# Patient Record
Sex: Female | Born: 1945
Health system: Southern US, Community
[De-identification: ages and names within clinical notes are randomized; demographics above are authoritative.]

## PROBLEM LIST (undated history)

## (undated) DIAGNOSIS — Z5189 Encounter for other specified aftercare: Secondary | ICD-10-CM

## (undated) DIAGNOSIS — I729 Aneurysm of unspecified site: Secondary | ICD-10-CM

## (undated) DIAGNOSIS — F32A Depression, unspecified: Secondary | ICD-10-CM

## (undated) DIAGNOSIS — C801 Malignant (primary) neoplasm, unspecified: Secondary | ICD-10-CM

## (undated) DIAGNOSIS — G589 Mononeuropathy, unspecified: Secondary | ICD-10-CM

## (undated) DIAGNOSIS — I639 Cerebral infarction, unspecified: Secondary | ICD-10-CM

## (undated) DIAGNOSIS — F329 Major depressive disorder, single episode, unspecified: Secondary | ICD-10-CM

## (undated) DIAGNOSIS — E785 Hyperlipidemia, unspecified: Secondary | ICD-10-CM

## (undated) DIAGNOSIS — I2699 Other pulmonary embolism without acute cor pulmonale: Secondary | ICD-10-CM

## (undated) DIAGNOSIS — Z8489 Family history of other specified conditions: Secondary | ICD-10-CM

## (undated) DIAGNOSIS — H409 Unspecified glaucoma: Secondary | ICD-10-CM

## (undated) DIAGNOSIS — D649 Anemia, unspecified: Secondary | ICD-10-CM

## (undated) DIAGNOSIS — K469 Unspecified abdominal hernia without obstruction or gangrene: Secondary | ICD-10-CM

## (undated) HISTORY — DX: Unspecified abdominal hernia without obstruction or gangrene: K46.9

## (undated) HISTORY — DX: Other pulmonary embolism without acute cor pulmonale: I26.99

## (undated) HISTORY — DX: Cerebral infarction, unspecified: I63.9

## (undated) HISTORY — DX: Encounter for other specified aftercare: Z51.89

## (undated) HISTORY — PX: CHOLECYSTECTOMY: SHX55

## (undated) HISTORY — PX: ABDOMINAL HYSTERECTOMY: SHX81

## (undated) HISTORY — PX: UPPER GI ENDOSCOPY: SHX6162

## (undated) HISTORY — DX: Major depressive disorder, single episode, unspecified: F32.9

## (undated) HISTORY — DX: Mononeuropathy, unspecified: G58.9

## (undated) HISTORY — DX: Depression, unspecified: F32.A

## (undated) HISTORY — PX: BREAST EXCISIONAL BIOPSY: SUR124

## (undated) HISTORY — DX: Unspecified glaucoma: H40.9

## (undated) HISTORY — DX: Malignant (primary) neoplasm, unspecified: C80.1

## (undated) HISTORY — DX: Aneurysm of unspecified site: I72.9

## (undated) HISTORY — DX: Anemia, unspecified: D64.9

## (undated) HISTORY — DX: Hyperlipidemia, unspecified: E78.5

---

## 1997-11-06 ENCOUNTER — Inpatient Hospital Stay (HOSPITAL_COMMUNITY): Admission: EM | Admit: 1997-11-06 | Discharge: 1997-11-08 | Payer: Self-pay | Admitting: Emergency Medicine

## 2001-06-01 ENCOUNTER — Emergency Department (HOSPITAL_COMMUNITY): Admission: EM | Admit: 2001-06-01 | Discharge: 2001-06-01 | Payer: Self-pay | Admitting: Emergency Medicine

## 2001-06-01 ENCOUNTER — Encounter: Payer: Self-pay | Admitting: Emergency Medicine

## 2001-06-21 ENCOUNTER — Emergency Department (HOSPITAL_COMMUNITY): Admission: EM | Admit: 2001-06-21 | Discharge: 2001-06-21 | Payer: Self-pay | Admitting: *Deleted

## 2002-04-14 ENCOUNTER — Encounter: Payer: Self-pay | Admitting: Gastroenterology

## 2002-04-14 ENCOUNTER — Ambulatory Visit (HOSPITAL_COMMUNITY): Admission: RE | Admit: 2002-04-14 | Discharge: 2002-04-14 | Payer: Self-pay | Admitting: Gastroenterology

## 2002-05-14 ENCOUNTER — Encounter: Payer: Self-pay | Admitting: Surgery

## 2002-05-20 ENCOUNTER — Observation Stay (HOSPITAL_COMMUNITY): Admission: RE | Admit: 2002-05-20 | Discharge: 2002-05-21 | Payer: Self-pay | Admitting: Surgery

## 2002-05-20 ENCOUNTER — Encounter: Payer: Self-pay | Admitting: Surgery

## 2004-01-18 ENCOUNTER — Emergency Department: Payer: Self-pay | Admitting: Emergency Medicine

## 2004-08-20 ENCOUNTER — Emergency Department: Payer: Self-pay | Admitting: Unknown Physician Specialty

## 2004-08-20 ENCOUNTER — Other Ambulatory Visit: Payer: Self-pay

## 2005-06-12 ENCOUNTER — Encounter: Payer: Self-pay | Admitting: Emergency Medicine

## 2008-02-12 DIAGNOSIS — I639 Cerebral infarction, unspecified: Secondary | ICD-10-CM

## 2008-02-12 HISTORY — DX: Cerebral infarction, unspecified: I63.9

## 2008-03-28 ENCOUNTER — Ambulatory Visit: Payer: Self-pay | Admitting: Gastroenterology

## 2008-06-02 ENCOUNTER — Ambulatory Visit: Payer: Self-pay | Admitting: Family Medicine

## 2008-09-14 ENCOUNTER — Ambulatory Visit: Payer: Self-pay | Admitting: Family Medicine

## 2009-02-06 ENCOUNTER — Ambulatory Visit: Payer: Self-pay | Admitting: Family Medicine

## 2009-02-11 DIAGNOSIS — I729 Aneurysm of unspecified site: Secondary | ICD-10-CM

## 2009-02-11 DIAGNOSIS — I2699 Other pulmonary embolism without acute cor pulmonale: Secondary | ICD-10-CM

## 2009-02-11 HISTORY — PX: CEREBRAL ANEURYSM REPAIR: SHX164

## 2009-02-11 HISTORY — DX: Aneurysm of unspecified site: I72.9

## 2009-02-11 HISTORY — DX: Other pulmonary embolism without acute cor pulmonale: I26.99

## 2009-02-27 ENCOUNTER — Ambulatory Visit: Payer: Self-pay | Admitting: Gastroenterology

## 2009-03-16 ENCOUNTER — Observation Stay: Payer: Self-pay | Admitting: Internal Medicine

## 2009-04-09 ENCOUNTER — Ambulatory Visit: Payer: Self-pay | Admitting: Family Medicine

## 2009-08-10 ENCOUNTER — Ambulatory Visit: Payer: Self-pay | Admitting: Family Medicine

## 2009-08-10 ENCOUNTER — Inpatient Hospital Stay: Payer: Self-pay | Admitting: Internal Medicine

## 2009-08-29 ENCOUNTER — Emergency Department: Payer: Self-pay | Admitting: Emergency Medicine

## 2009-11-01 ENCOUNTER — Inpatient Hospital Stay: Payer: Self-pay | Admitting: Psychiatry

## 2010-02-02 ENCOUNTER — Emergency Department: Payer: Self-pay | Admitting: Emergency Medicine

## 2010-07-30 ENCOUNTER — Ambulatory Visit: Payer: Self-pay | Admitting: Family Medicine

## 2010-08-03 ENCOUNTER — Emergency Department: Payer: Self-pay | Admitting: Emergency Medicine

## 2010-09-17 ENCOUNTER — Ambulatory Visit: Payer: Self-pay | Admitting: Family Medicine

## 2011-01-29 ENCOUNTER — Inpatient Hospital Stay: Payer: Self-pay | Admitting: Student

## 2011-02-12 HISTORY — PX: COLONOSCOPY: SHX174

## 2011-10-30 ENCOUNTER — Ambulatory Visit: Payer: Self-pay | Admitting: Gastroenterology

## 2011-11-21 ENCOUNTER — Ambulatory Visit: Payer: Self-pay | Admitting: Gastroenterology

## 2011-11-27 ENCOUNTER — Ambulatory Visit: Payer: Self-pay | Admitting: Hematology and Oncology

## 2011-11-27 ENCOUNTER — Observation Stay: Payer: Self-pay | Admitting: Hematology and Oncology

## 2011-11-27 LAB — CBC CANCER CENTER
Eosinophil #: 0.1 x10 3/mm (ref 0.0–0.7)
HCT: 20.7 % — ABNORMAL LOW (ref 35.0–47.0)
HGB: 6.2 g/dL — ABNORMAL LOW (ref 12.0–16.0)
Lymphocyte %: 26.7 %
MCH: 20.5 pg — ABNORMAL LOW (ref 26.0–34.0)
MCHC: 29.8 g/dL — ABNORMAL LOW (ref 32.0–36.0)
Monocyte #: 0.5 x10 3/mm (ref 0.2–0.9)
Neutrophil %: 62.2 %
Platelet: 304 x10 3/mm (ref 150–440)
WBC: 5.9 x10 3/mm (ref 3.6–11.0)

## 2011-11-27 LAB — FERRITIN: Ferritin (ARMC): 4 ng/mL — ABNORMAL LOW (ref 8–388)

## 2011-11-27 LAB — IRON AND TIBC
Iron Bind.Cap.(Total): 528 ug/dL — ABNORMAL HIGH (ref 250–450)
Iron Saturation: 3 %
Iron: 16 ug/dL — ABNORMAL LOW (ref 50–170)
Unbound Iron-Bind.Cap.: 512 ug/dL

## 2011-11-28 LAB — FOLATE: Folic Acid: 20.4 ng/mL (ref 3.1–100.0)

## 2011-11-28 LAB — HEMATOCRIT: HCT: 25.6 % — ABNORMAL LOW (ref 35.0–47.0)

## 2011-11-28 LAB — HEMOGLOBIN: HGB: 8.3 g/dL — ABNORMAL LOW (ref 12.0–16.0)

## 2011-12-13 ENCOUNTER — Ambulatory Visit: Payer: Self-pay | Admitting: Hematology and Oncology

## 2011-12-13 LAB — CBC CANCER CENTER
Basophil #: 0.1 x10 3/mm (ref 0.0–0.1)
Basophil %: 0.8 %
Eosinophil %: 1.2 %
HGB: 11.6 g/dL — ABNORMAL LOW (ref 12.0–16.0)
Lymphocyte #: 2.2 x10 3/mm (ref 1.0–3.6)
Lymphocyte %: 22 %
MCH: 25.5 pg — ABNORMAL LOW (ref 26.0–34.0)
MCV: 82 fL (ref 80–100)
Monocyte %: 8.4 %
Neutrophil #: 6.7 x10 3/mm — ABNORMAL HIGH (ref 1.4–6.5)
RBC: 4.56 10*6/uL (ref 3.80–5.20)

## 2012-01-12 ENCOUNTER — Ambulatory Visit: Payer: Self-pay | Admitting: Hematology and Oncology

## 2012-01-29 LAB — CBC CANCER CENTER
Basophil %: 1 %
Eosinophil #: 0.1 x10 3/mm (ref 0.0–0.7)
HCT: 37.6 % (ref 35.0–47.0)
HGB: 12.4 g/dL (ref 12.0–16.0)
Lymphocyte #: 2 x10 3/mm (ref 1.0–3.6)
Lymphocyte %: 37.4 %
MCHC: 33 g/dL (ref 32.0–36.0)
MCV: 80 fL (ref 80–100)
Monocyte #: 0.4 x10 3/mm (ref 0.2–0.9)
Monocyte %: 8.2 %
Neutrophil #: 2.8 x10 3/mm (ref 1.4–6.5)
WBC: 5.5 x10 3/mm (ref 3.6–11.0)

## 2012-01-29 LAB — BASIC METABOLIC PANEL
Calcium, Total: 8.7 mg/dL (ref 8.5–10.1)
Chloride: 106 mmol/L (ref 98–107)
Co2: 26 mmol/L (ref 21–32)
Creatinine: 0.87 mg/dL (ref 0.60–1.30)
EGFR (African American): >60
Sodium: 143 mmol/L (ref 136–145)

## 2012-02-12 ENCOUNTER — Ambulatory Visit: Payer: Self-pay | Admitting: Hematology and Oncology

## 2012-02-12 HISTORY — PX: HERNIA REPAIR: SHX51

## 2012-04-11 ENCOUNTER — Ambulatory Visit: Payer: Self-pay | Admitting: Hematology and Oncology

## 2012-06-02 ENCOUNTER — Inpatient Hospital Stay: Payer: Self-pay | Admitting: Internal Medicine

## 2012-06-02 ENCOUNTER — Ambulatory Visit: Payer: Self-pay | Admitting: Hematology and Oncology

## 2012-06-02 LAB — CBC CANCER CENTER
Basophil #: 0.1 x10 3/mm (ref 0.0–0.1)
Basophil %: 1.2 %
Eosinophil %: 0.6 %
HGB: 6.9 g/dL — ABNORMAL LOW (ref 12.0–16.0)
Lymphocyte %: 20.6 %
MCH: 18.5 pg — ABNORMAL LOW (ref 26.0–34.0)
MCHC: 30.2 g/dL — ABNORMAL LOW (ref 32.0–36.0)
Monocyte #: 0.7 x10 3/mm (ref 0.2–0.9)
Neutrophil #: 5.3 x10 3/mm (ref 1.4–6.5)
Neutrophil %: 68.7 %
Platelet: 363 x10 3/mm (ref 150–440)
RBC: 3.72 10*6/uL — ABNORMAL LOW (ref 3.80–5.20)
RDW: 18.2 % — ABNORMAL HIGH (ref 11.5–14.5)

## 2012-06-02 LAB — COMPREHENSIVE METABOLIC PANEL
Albumin: 3.7 g/dL (ref 3.4–5.0)
Anion Gap: 15 (ref 7–16)
Bilirubin,Total: 0.2 mg/dL (ref 0.2–1.0)
Chloride: 105 mmol/L (ref 98–107)
Co2: 22 mmol/L (ref 21–32)
EGFR (African American): >60
EGFR (Non-African Amer.): 59 — ABNORMAL LOW
Osmolality: 285 (ref 275–301)
Potassium: 4 mmol/L (ref 3.5–5.1)
SGOT(AST): 15 U/L (ref 15–37)
SGPT (ALT): 22 U/L (ref 12–78)
Sodium: 142 mmol/L (ref 136–145)

## 2012-06-02 LAB — FOLATE: Folic Acid: 19.6 ng/mL (ref 3.1–100.0)

## 2012-06-03 LAB — CBC WITH DIFFERENTIAL/PLATELET
Basophil %: 0.5 %
Eosinophil #: 0.2 10*3/uL (ref 0.0–0.7)
Eosinophil #: 0.1 10*3/uL (ref 0.0–0.7)
Eosinophil %: 1.9 %
HCT: 25.8 % — ABNORMAL LOW (ref 35.0–47.0)
HCT: 26.5 % — ABNORMAL LOW (ref 35.0–47.0)
HGB: 8.3 g/dL — ABNORMAL LOW (ref 12.0–16.0)
Lymphocyte #: 2.1 10*3/uL (ref 1.0–3.6)
Lymphocyte %: 40.8 %
Lymphocyte %: 33.7 %
MCH: 21.8 pg — ABNORMAL LOW (ref 26.0–34.0)
MCH: 21.4 pg — ABNORMAL LOW (ref 26.0–34.0)
MCHC: 31.8 g/dL — ABNORMAL LOW (ref 32.0–36.0)
MCV: 68 fL — ABNORMAL LOW (ref 80–100)
MCV: 67 fL — ABNORMAL LOW (ref 80–100)
Monocyte #: 0.9 x10 3/mm (ref 0.2–0.9)
Monocyte #: 0.7 x10 3/mm (ref 0.2–0.9)
Neutrophil #: 3.3 10*3/uL (ref 1.4–6.5)
RDW: 24.2 % — ABNORMAL HIGH (ref 11.5–14.5)
RDW: 24 % — ABNORMAL HIGH (ref 11.5–14.5)
WBC: 6.9 10*3/uL (ref 3.6–11.0)
WBC: 6.2 10*3/uL (ref 3.6–11.0)

## 2012-06-03 LAB — BASIC METABOLIC PANEL
Calcium, Total: 8.1 mg/dL — ABNORMAL LOW (ref 8.5–10.1)
Co2: 27 mmol/L (ref 21–32)
Creatinine: 0.64 mg/dL (ref 0.60–1.30)
EGFR (African American): >60
Glucose: 96 mg/dL (ref 65–99)
Osmolality: 282 (ref 275–301)
Potassium: 3.8 mmol/L (ref 3.5–5.1)

## 2012-06-04 DIAGNOSIS — D649 Anemia, unspecified: Secondary | ICD-10-CM

## 2012-06-04 LAB — CBC WITH DIFFERENTIAL/PLATELET
Basophil #: 0.1 10*3/uL (ref 0.0–0.1)
Basophil %: 0.8 %
Eosinophil #: 0.3 10*3/uL (ref 0.0–0.7)
Eosinophil %: 4.2 %
HCT: 25.3 % — ABNORMAL LOW (ref 35.0–47.0)
HGB: 8 g/dL — ABNORMAL LOW (ref 12.0–16.0)
Lymphocyte #: 2.6 10*3/uL (ref 1.0–3.6)
Lymphocyte %: 41 %
MCH: 21.4 pg — ABNORMAL LOW (ref 26.0–34.0)
MCHC: 31.7 g/dL — ABNORMAL LOW (ref 32.0–36.0)
MCV: 68 fL — ABNORMAL LOW (ref 80–100)
Monocyte #: 0.8 x10 3/mm (ref 0.2–0.9)
Monocyte %: 11.9 %
Neutrophil #: 2.7 10*3/uL (ref 1.4–6.5)
Neutrophil %: 42.1 %
Platelet: 279 10*3/uL (ref 150–440)
WBC: 6.4 10*3/uL (ref 3.6–11.0)

## 2012-06-04 LAB — PATHOLOGY REPORT

## 2012-06-10 ENCOUNTER — Encounter: Payer: Self-pay | Admitting: General Surgery

## 2012-06-11 ENCOUNTER — Ambulatory Visit: Payer: Self-pay | Admitting: Hematology and Oncology

## 2012-07-07 ENCOUNTER — Ambulatory Visit: Payer: Self-pay | Admitting: General Surgery

## 2012-08-04 ENCOUNTER — Encounter: Payer: Self-pay | Admitting: General Surgery

## 2012-08-04 ENCOUNTER — Ambulatory Visit (INDEPENDENT_AMBULATORY_CARE_PROVIDER_SITE_OTHER): Payer: Medicare Other | Admitting: General Surgery

## 2012-08-04 VITALS — BP 118/68 | HR 90 | Resp 16 | Ht 61.0 in | Wt 196.0 lb

## 2012-08-04 DIAGNOSIS — R1310 Dysphagia, unspecified: Secondary | ICD-10-CM

## 2012-08-04 DIAGNOSIS — K449 Diaphragmatic hernia without obstruction or gangrene: Secondary | ICD-10-CM | POA: Insufficient documentation

## 2012-08-04 NOTE — Progress Notes (Addendum)
Patient ID: Gail Hardy, female   DOB: 11/02/1945, 67 y.o.   MRN: [id number]  Chief Complaint  Patient presents with  . Other    evaluation of hernia    HPI Gail Hardy is a 67 y.o. female who presents for a hernia evaluation. Was in [facility] May 2014 and needed a blood transfusion, at that time Dr Mechele Collin did a upper endoscopy.  She does admit to having trouble swallowing lettuce, bread, chicken and beef for about 4-5 months and seem to be worse here lately.  Angel hair pasta is "ok" if cut up really good.  She had a  barium swallow done as well at [facility]  She does have depression and takes Abilify which has a side effect of tremors. Weight loss has been about 10 pounds. The patient was evaluated during her April 2014 hospitalization for anemia. Upper endoscopy at that time showed significant crater, linear and superficial gastric ulcers within the gastric body. No active bleeding. Pulse also deformity of the duodenal bulb. 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 HPI  Past Medical History  Diagnosis Date  . Stroke   . Aneurysm 2011  . Blood transfusion without reported diagnosis 2013,2014    x 2  . Glaucoma   . Hyperlipidemia   . Anemia   . Hernia   . Depression   . Pulmonary emboli 2011    Past Surgical History  Procedure Laterality Date  . Abdominal hysterectomy    . Cholecystectomy    . Upper gi endoscopy  2013, 2014    [facility]  . Colonoscopy  2013    [facility]    Family History  Problem Relation Age of Onset  . Emphysema Father     Social History History  Substance Use Topics  . Smoking status: Former Games developer  . Smokeless tobacco: Never Used  . Alcohol Use: No    No Known Allergies  Current Outpatient Prescriptions  Medication Sig Dispense Refill  . ABILIFY 30 MG tablet Take 30 mg by mouth daily.       . citalopram (CELEXA) 20 MG tablet Take 20 mg by mouth daily.       . ferrous sulfate 325 (65 FE) MG tablet Take 325 mg by  mouth 2 (two) times daily.      Marland Kitchen latanoprost (XALATAN) 0.005 % ophthalmic solution Place 1 drop into both eyes at bedtime.       Marland Kitchen rOPINIRole (REQUIP) 2 MG tablet Take 4 mg by mouth at bedtime.       . temazepam (RESTORIL) 30 MG capsule Take 30 mg by mouth at bedtime.        No current facility-administered medications for this visit.    Review of Systems Review of Systems  Constitutional: Negative.   Respiratory: Negative.   Cardiovascular: Negative.     Blood pressure 118/68, pulse 90, resp. rate 16, height 5\' 1"  (1.549 m), weight 196 lb (88.905 kg).  Physical Exam Physical Exam  Constitutional: She is oriented to person, place, and time. She appears well-developed.  Cardiovascular: Normal rate and regular rhythm.   Pulmonary/Chest: Effort normal and breath sounds normal.  Abdominal: Soft. Bowel sounds are normal.  Neurological: She is alert and oriented to person, place, and time.  Skin: Skin is warm and dry.    Data Reviewed The patient underwent upper lower endoscopy as well as capsule endoscopy in fall 2013th to assess for source of GI bleeding.  Assessment    Paraesophageal  hernia with dysphagia, likely gastric source of recent GI blood loss.    Plan    The indications for surgical repair were discussed. Consultation will be obtained with Effie Shy, M.D. for his expertise in regards to repair of sizable paraesophageal hernias. The patient will be contacted when this appointment is scheduled. Depending upon Dr. Allayne Butcher recommendation, surgery may be completed here at [facility], or in [place] as he determines is in her best interest.       [name] 10/15/2012, 7:08 AM

## 2012-08-04 NOTE — Patient Instructions (Addendum)
The patient is aware to call back for any questions or concerns.  We will be in touch to schedule Repair of Paraesophageal Hernia.  Hernia repair discussed as well as risk and benefits of surgery.  May need a few days in the hospital to recovery.

## 2012-08-05 ENCOUNTER — Telehealth: Payer: Self-pay | Admitting: *Deleted

## 2012-08-05 ENCOUNTER — Encounter: Payer: Self-pay | Admitting: General Surgery

## 2012-08-05 DIAGNOSIS — R1310 Dysphagia, unspecified: Secondary | ICD-10-CM | POA: Insufficient documentation

## 2012-08-05 NOTE — Telephone Encounter (Signed)
Message copied by Nicholes Mango on Wed Aug 05, 2012  8:53 AM ------      Message from: [place], [place] W      Created: Wed Aug 05, 2012  7:00 AM       Please arrange an appointment with Effie Shy, M.D. regarding paraesophageal hernia. Thank you ------

## 2012-08-05 NOTE — Telephone Encounter (Signed)
Patient was contacted today to inform her that records have been forwarded to Dr. Effie Shy and Lupita Leash at their office will be calling patient to arrange appointment date. This patient and her husband verbalize understanding.   Records received per Lupita Leash.

## 2012-09-10 ENCOUNTER — Ambulatory Visit: Payer: Self-pay | Admitting: Hematology and Oncology

## 2012-09-10 LAB — CBC CANCER CENTER
Basophil %: 0.8 %
Eosinophil #: 0.1 x10 3/mm (ref 0.0–0.7)
Eosinophil %: 1.7 %
Lymphocyte #: 2.6 x10 3/mm (ref 1.0–3.6)
Lymphocyte %: 36.9 %
MCV: 81 fL (ref 80–100)
Monocyte #: 0.6 x10 3/mm (ref 0.2–0.9)
Neutrophil #: 3.7 x10 3/mm (ref 1.4–6.5)
Neutrophil %: 51.9 %
RBC: 4.91 10*6/uL (ref 3.80–5.20)
WBC: 7.1 x10 3/mm (ref 3.6–11.0)

## 2012-09-11 ENCOUNTER — Ambulatory Visit: Payer: Self-pay | Admitting: Hematology and Oncology

## 2012-10-01 ENCOUNTER — Telehealth: Payer: Self-pay | Admitting: *Deleted

## 2012-10-01 NOTE — Telephone Encounter (Signed)
Per Sheria Lang at Dr. Allayne Butcher office, this patient has been scheduled for a lap paraesophageal hernia repair on 10-20-12 at 9:30 am (3 hour case) and Dr. Alva Garnet would like you to assit. I have blocked you out on your schedule to help. I also asked that their office fax records for your review.

## 2012-10-13 ENCOUNTER — Ambulatory Visit: Payer: Self-pay | Admitting: General Surgery

## 2012-10-13 ENCOUNTER — Other Ambulatory Visit: Payer: Self-pay | Admitting: General Surgery

## 2012-10-13 DIAGNOSIS — K449 Diaphragmatic hernia without obstruction or gangrene: Secondary | ICD-10-CM

## 2012-10-13 LAB — BASIC METABOLIC PANEL
Anion Gap: 5 — ABNORMAL LOW (ref 7–16)
BUN: 18 mg/dL (ref 7–18)
Chloride: 107 mmol/L (ref 98–107)
Co2: 27 mmol/L (ref 21–32)
Creatinine: 0.73 mg/dL (ref 0.60–1.30)
EGFR (African American): >60
Potassium: 3.6 mmol/L (ref 3.5–5.1)
Sodium: 139 mmol/L (ref 136–145)

## 2012-10-13 LAB — CBC WITH DIFFERENTIAL/PLATELET
Basophil %: 0.9 %
Eosinophil #: 0.1 10*3/uL (ref 0.0–0.7)
Eosinophil %: 1.4 %
HCT: 36.5 % (ref 35.0–47.0)
HGB: 12.4 g/dL (ref 12.0–16.0)
Lymphocyte %: 30.4 %
MCH: 26.8 pg (ref 26.0–34.0)
MCV: 79 fL — ABNORMAL LOW (ref 80–100)
Monocyte #: 0.7 x10 3/mm (ref 0.2–0.9)
Platelet: 283 10*3/uL (ref 150–440)

## 2012-10-14 ENCOUNTER — Encounter: Payer: Self-pay | Admitting: General Surgery

## 2012-10-14 ENCOUNTER — Ambulatory Visit (INDEPENDENT_AMBULATORY_CARE_PROVIDER_SITE_OTHER): Payer: Medicare Other | Admitting: General Surgery

## 2012-10-14 VITALS — BP 130/64 | HR 102 | Resp 20 | Ht 61.0 in | Wt 199.0 lb

## 2012-10-14 DIAGNOSIS — K449 Diaphragmatic hernia without obstruction or gangrene: Secondary | ICD-10-CM

## 2012-10-14 NOTE — Progress Notes (Signed)
Patient ID: Gail Hardy, female   DOB: [birth year], 67 y.o.   MRN: [id number]  Chief Complaint  Patient presents with  . Pre-op Exam    paraesophageal hernia    HPI Gail Hardy is a 67 y.o. female here today for her pre op paraesophageal hernia. The patient reports persistent postprandial discomfort and occasional vomiting. In spite of this her weight is up 4 pounds from urgent 2014 exam. She's been evaluated by Effie Shy, M.D. and plans are for repair of her paraesophageal hernia next week. She is accompanied by her husband. HPI  Past Medical History  Diagnosis Date  . Stroke   . Aneurysm 2011  . Blood transfusion without reported diagnosis 2013,2014    x 2  . Glaucoma   . Hyperlipidemia   . Anemia   . Hernia   . Depression   . Pulmonary emboli 2011    Past Surgical History  Procedure Laterality Date  . Abdominal hysterectomy    . Cholecystectomy    . Upper gi endoscopy  2013, 2014    [facility]  . Colonoscopy  2013    [facility]    Family History  Problem Relation Age of Onset  . Emphysema Father     Social History History  Substance Use Topics  . Smoking status: Former Games developer  . Smokeless tobacco: Never Used  . Alcohol Use: No    No Known Allergies  Current Outpatient Prescriptions  Medication Sig Dispense Refill  . ABILIFY 30 MG tablet Take 30 mg by mouth daily.       . citalopram (CELEXA) 20 MG tablet Take 20 mg by mouth daily.       . ferrous sulfate 325 (65 FE) MG tablet Take 325 mg by mouth 2 (two) times daily.      Marland Kitchen latanoprost (XALATAN) 0.005 % ophthalmic solution Place 1 drop into both eyes at bedtime.       Marland Kitchen rOPINIRole (REQUIP) 2 MG tablet Take 4 mg by mouth at bedtime.       . temazepam (RESTORIL) 30 MG capsule Take 30 mg by mouth at bedtime.        No current facility-administered medications for this visit.    Review of Systems Review of Systems  Constitutional: Negative.   Respiratory: Negative.   Cardiovascular: Negative.      Blood pressure 130/64, pulse 102, resp. rate 20, height 5\' 1"  (1.549 m), weight 199 lb (90.266 kg).  Physical Exam Physical Exam  Constitutional: She is oriented to person, place, and time. She appears well-developed and well-nourished.  Eyes: Conjunctivae are normal. No scleral icterus.  Cardiovascular: Normal rate, regular rhythm and normal heart sounds.   Pulmonary/Chest: Effort normal and breath sounds normal.  Abdominal: Soft. Bowel sounds are normal.  Lymphadenopathy:    She has no cervical adenopathy.  Neurological: She is alert and oriented to person, place, and time.  Skin: Skin is warm and dry.    Data Reviewed No new data.  Assessment    Paraesophageal hernia with dysphagia.    Plan     plans for repair of the paraesophageal hernia were reviewed. The risks associated with the procedure including those of bleeding, infection, cardiovascular events, pulmonary embolus, DVT and UTI were reviewed. The possibility of a gastrostomy tube for stomach fixation was discussed.        [name] 10/15/2012, 7:09 AM

## 2012-10-14 NOTE — Patient Instructions (Addendum)
The patient will be scheduled for  surgery.

## 2012-10-15 ENCOUNTER — Encounter: Payer: Self-pay | Admitting: General Surgery

## 2012-10-20 ENCOUNTER — Ambulatory Visit: Payer: Self-pay | Admitting: Bariatrics

## 2012-10-20 DIAGNOSIS — K449 Diaphragmatic hernia without obstruction or gangrene: Secondary | ICD-10-CM

## 2012-10-20 DIAGNOSIS — K254 Chronic or unspecified gastric ulcer with hemorrhage: Secondary | ICD-10-CM

## 2012-10-22 ENCOUNTER — Encounter: Payer: Self-pay | Admitting: *Deleted

## 2012-10-22 ENCOUNTER — Telehealth: Payer: Self-pay | Admitting: *Deleted

## 2012-10-22 ENCOUNTER — Encounter: Payer: Self-pay | Admitting: General Surgery

## 2012-10-22 NOTE — Telephone Encounter (Signed)
I called insurance #1 [phone] and had ondansetron odt 4 mg #30 authorized for 1 year

## 2012-11-02 ENCOUNTER — Ambulatory Visit (INDEPENDENT_AMBULATORY_CARE_PROVIDER_SITE_OTHER): Payer: Medicare Other | Admitting: General Surgery

## 2012-11-02 ENCOUNTER — Encounter: Payer: Self-pay | Admitting: General Surgery

## 2012-11-02 VITALS — BP 138/76 | HR 74 | Resp 14 | Ht 61.0 in | Wt 196.0 lb

## 2012-11-02 DIAGNOSIS — R1310 Dysphagia, unspecified: Secondary | ICD-10-CM

## 2012-11-02 DIAGNOSIS — K449 Diaphragmatic hernia without obstruction or gangrene: Secondary | ICD-10-CM

## 2012-11-02 NOTE — Patient Instructions (Addendum)
Patient advised to continue using heating pad for pain relief. She is also advised she can start increasing her diet slowly. Patient to return in 6 weeks for a follow up visit.

## 2012-11-02 NOTE — Progress Notes (Signed)
Patient ID: Gail Hardy, female   DOB: 10-16-1945, 67 y.o.   MRN: [id number]  Chief Complaint  Patient presents with  . Routine Post Op    post op lap paraesophageal hernia repair    HPI Gail Hardy is a 67 y.o. female who presents for a post op laparoscopic paraesophageal hernia repair. The procedure was done 10/20/12. The only complaint the patient states is left upper quadrant of the abdomen. Overall the patient is doing well. The patient states she had an episode of difficultly catching her breath the day after surgery while just sitting there. This has not recurred.  She reports that she did develop some pain under the left rib cage in the last couple of days, worse with motion. She is still being careful with her diet, voiding dry foods or hard foods. She reports no difficulty with bowel function.   HPI  Past Medical History  Diagnosis Date  . Stroke   . Aneurysm 2011  . Blood transfusion without reported diagnosis 2013,2014    x 2  . Glaucoma   . Hyperlipidemia   . Anemia   . Hernia   . Depression   . Pulmonary emboli 2011    Past Surgical History  Procedure Laterality Date  . Abdominal hysterectomy    . Cholecystectomy    . Upper gi endoscopy  2013, 2014    [facility]  . Colonoscopy  2013    [facility]thik[facility]  . Hernia repair  2014    lap paraesophageal repair    Family History  Problem Relation Age of Onset  . Emphysema Father     Social History History  Substance Use Topics  . Smoking status: Former Games developer  . Smokeless tobacco: Never Used  . Alcohol Use: No    Allergies  Allergen Reactions  . Alprazolam     Other reaction(s): Altered mental status (finding)  . Diazepam     Other reaction(s): Feeling agitated (finding)    Current Outpatient Prescriptions  Medication Sig Dispense Refill  . ABILIFY 30 MG tablet Take 30 mg by mouth daily.       . citalopram (CELEXA) 20 MG tablet Take 20 mg by mouth daily.       . ferrous sulfate 325 (65 FE) MG tablet  Take 325 mg by mouth 2 (two) times daily.      Marland Kitchen latanoprost (XALATAN) 0.005 % ophthalmic solution Place 1 drop into both eyes at bedtime.       . ondansetron (ZOFRAN-ODT) 4 MG disintegrating tablet Take 1 tablet by mouth as needed.      Marland Kitchen rOPINIRole (REQUIP) 2 MG tablet Take 4 mg by mouth at bedtime.       . temazepam (RESTORIL) 30 MG capsule Take 30 mg by mouth at bedtime.        No current facility-administered medications for this visit.    Review of Systems Review of Systems  Constitutional: Negative.   Respiratory: Negative.   Cardiovascular: Negative.   Gastrointestinal: Negative.     Blood pressure 138/76, pulse 74, resp. rate 14, height 5\' 1"  (1.549 m), weight 196 lb (88.905 kg).  Physical Exam Physical Exam  Constitutional: She is oriented to person, place, and time. She appears well-developed and well-nourished.  Cardiovascular: Normal rate, regular rhythm and normal heart sounds.   No murmur heard. Pulmonary/Chest: Effort normal and breath sounds normal.  Abdominal: Soft. Bowel sounds are normal. There is tenderness (left upper quadrant right under the rib near port site. ).  Neurological: She is alert and oriented to person, place, and time.  Skin: Skin is warm and dry.    Data Reviewed No data.  Assessment    Left rib pain secondary to previously placed port site. No evidence of intra-abdominal process.  Doing well status post repair of paraesophageal hernia with Nissen fundoplication and gastropexy.    Plan    The patient will gradually liberalized her diet as tolerated. She has been encouraged to avoid dry bread at this time. She may make use of meats as she is comfortable. She will follow up in one to 2 weeks with Dr. Alva Garnet, at which time the gastropexy wires will be removed.  As she is still salving some discomfort or prescription for Norco 5/325, #30 with the inscription one by mouth every 4 hours when necessary for pain with no refills was provided.        [name] 11/03/2012, 7:07 AM

## 2012-11-03 ENCOUNTER — Encounter: Payer: Self-pay | Admitting: General Surgery

## 2012-12-02 ENCOUNTER — Ambulatory Visit (INDEPENDENT_AMBULATORY_CARE_PROVIDER_SITE_OTHER): Payer: Medicare Other | Admitting: General Surgery

## 2012-12-02 ENCOUNTER — Encounter: Payer: Self-pay | Admitting: General Surgery

## 2012-12-02 VITALS — BP 130/70 | HR 100 | Resp 14 | Ht 61.0 in | Wt 198.0 lb

## 2012-12-02 DIAGNOSIS — K449 Diaphragmatic hernia without obstruction or gangrene: Secondary | ICD-10-CM

## 2012-12-02 NOTE — Progress Notes (Signed)
Patient ID: Gail Hardy, female   DOB: 1945-09-14, 67 y.o.   MRN: [id number]  Chief Complaint  Patient presents with  . Follow-up    paraesophageal hernia    HPI Gail Hardy is a 67 y.o. female who presents for a post op laparoscopic paraesophageal hernia repair. The procedure was done 10/20/12.Patient states she is doing well. She reports no dysphasia. No dietary intolerance.  HPI  Past Medical History  Diagnosis Date  . Stroke   . Aneurysm 2011  . Blood transfusion without reported diagnosis 2013,2014    x 2  . Glaucoma   . Hyperlipidemia   . Anemia   . Hernia   . Depression   . Pulmonary emboli 2011  . Cancer     Past Surgical History  Procedure Laterality Date  . Abdominal hysterectomy    . Cholecystectomy    . Upper gi endoscopy  2013, 2014    [facility]  . Colonoscopy  2013    [facility]th[facility]  . Hernia repair  2014    lap paraesophageal repair    Family History  Problem Relation Age of Onset  . Emphysema Father     Social History History  Substance Use Topics  . Smoking status: Former Games developer  . Smokeless tobacco: Never Used  . Alcohol Use: No    Allergies  Allergen Reactions  . Alprazolam     Other reaction(s): Altered mental status (finding)  . Diazepam     Other reaction(s): Feeling agitated (finding)    Current Outpatient Prescriptions  Medication Sig Dispense Refill  . ABILIFY 30 MG tablet Take 30 mg by mouth daily.       . citalopram (CELEXA) 20 MG tablet Take 20 mg by mouth daily.       . ferrous sulfate 325 (65 FE) MG tablet Take 325 mg by mouth 2 (two) times daily.      Marland Kitchen latanoprost (XALATAN) 0.005 % ophthalmic solution Place 1 drop into both eyes at bedtime.       . ondansetron (ZOFRAN-ODT) 4 MG disintegrating tablet Take 1 tablet by mouth as needed.      Marland Kitchen rOPINIRole (REQUIP) 2 MG tablet Take 4 mg by mouth at bedtime.       . temazepam (RESTORIL) 30 MG capsule Take 30 mg by mouth at bedtime.        No current facility-administered  medications for this visit.    Review of Systems Review of Systems  Constitutional: Negative.   Respiratory: Negative.   Cardiovascular: Negative.     Blood pressure 130/70, pulse 100, resp. rate 14, height 5\' 1"  (1.549 m), weight 198 lb (89.812 kg).  Physical Exam Physical Exam  Constitutional: She is oriented to person, place, and time. She appears well-developed and well-nourished.  Eyes: No scleral icterus.  Cardiovascular: Normal rate, regular rhythm and normal heart sounds.   Pulmonary/Chest: Breath sounds normal.  Abdominal: Soft. Normal appearance and bowel sounds are normal.  Lymphadenopathy:    She has no cervical adenopathy.  Neurological: She is alert and oriented to person, place, and time.  Skin: Skin is dry.    Data Reviewed None.  Assessment    Doing well status post paraesophageal hernia repair.     Plan    Follow up here will be on an as needed basis.        [name] 12/02/2012, 8:21 PM

## 2012-12-02 NOTE — Patient Instructions (Signed)
Patient to return as needed. 

## 2012-12-16 ENCOUNTER — Ambulatory Visit: Payer: Self-pay | Admitting: Hematology and Oncology

## 2012-12-17 ENCOUNTER — Other Ambulatory Visit: Payer: Self-pay | Admitting: Bariatrics

## 2012-12-17 DIAGNOSIS — K254 Chronic or unspecified gastric ulcer with hemorrhage: Secondary | ICD-10-CM

## 2012-12-21 ENCOUNTER — Ambulatory Visit
Admission: RE | Admit: 2012-12-21 | Discharge: 2012-12-21 | Disposition: A | Discharge status: Home / Self Care | Payer: Medicare Other | Source: Ambulatory Visit | Attending: Bariatrics | Admitting: Bariatrics

## 2012-12-21 DIAGNOSIS — K254 Chronic or unspecified gastric ulcer with hemorrhage: Secondary | ICD-10-CM

## 2012-12-31 LAB — CBC CANCER CENTER
Basophil #: 0.1 x10 3/mm (ref 0.0–0.1)
Eosinophil #: 0.1 x10 3/mm (ref 0.0–0.7)
Eosinophil %: 0.8 %
HCT: 36.8 % (ref 35.0–47.0)
Lymphocyte %: 27 %
MCH: 23.6 pg — ABNORMAL LOW (ref 26.0–34.0)
MCHC: 31.6 g/dL — ABNORMAL LOW (ref 32.0–36.0)
MCV: 75 fL — ABNORMAL LOW (ref 80–100)
Neutrophil #: 5.8 x10 3/mm (ref 1.4–6.5)
RBC: 4.94 10*6/uL (ref 3.80–5.20)
RDW: 15.2 % — ABNORMAL HIGH (ref 11.5–14.5)
WBC: 9.1 x10 3/mm (ref 3.6–11.0)

## 2013-01-11 ENCOUNTER — Ambulatory Visit: Payer: Self-pay | Admitting: Hematology and Oncology

## 2013-06-17 ENCOUNTER — Ambulatory Visit: Payer: Self-pay | Admitting: Hematology and Oncology

## 2013-06-17 LAB — CBC CANCER CENTER
BASOS ABS: 0 x10 3/mm (ref 0.0–0.1)
Basophil %: 0.7 %
EOS ABS: 0.1 x10 3/mm (ref 0.0–0.7)
Eosinophil %: 1.4 %
HCT: 41.6 % (ref 35.0–47.0)
HGB: 13.8 g/dL (ref 12.0–16.0)
LYMPHS PCT: 32 %
Lymphocyte #: 2.4 x10 3/mm (ref 1.0–3.6)
MCH: 27 pg (ref 26.0–34.0)
MCHC: 33.1 g/dL (ref 32.0–36.0)
MCV: 82 fL (ref 80–100)
MONOS PCT: 7.6 %
Monocyte #: 0.6 x10 3/mm (ref 0.2–0.9)
NEUTROS PCT: 58.3 %
Neutrophil #: 4.3 x10 3/mm (ref 1.4–6.5)
PLATELETS: 210 x10 3/mm (ref 150–440)
RBC: 5.11 10*6/uL (ref 3.80–5.20)
RDW: 17.6 % — ABNORMAL HIGH (ref 11.5–14.5)
WBC: 7.4 x10 3/mm (ref 3.6–11.0)

## 2013-07-12 ENCOUNTER — Ambulatory Visit: Payer: Self-pay | Admitting: Hematology and Oncology

## 2013-08-04 ENCOUNTER — Ambulatory Visit: Payer: Self-pay | Admitting: Family Medicine

## 2013-08-10 DIAGNOSIS — G43119 Migraine with aura, intractable, without status migrainosus: Secondary | ICD-10-CM | POA: Insufficient documentation

## 2013-12-13 ENCOUNTER — Encounter: Payer: Self-pay | Admitting: General Surgery

## 2014-06-03 NOTE — Op Note (Signed)
PATIENT NAME:  Gail Hardy, Gail Hardy MR#:  [id number] DATE OF BIRTH:  [birth year]  DATE OF PROCEDURE:  10/20/2012  PROCEDURE PERFORMED:  Laparoscopic repair of paraesophageal hiatal hernia with relaxing incision of the right hemidiaphragm and xenograft coverage of iatrogenic defect followed by 034 degree fundoplication and anterior gastropexy.   PHYSICIANS IN ATTENDANCE:  Arletta Bale, M.D.  ASSISTANT:  Hervey Ard, M.D.   PREOPERATIVE DIAGNOSIS:  Paraesophageal hiatal hernia with progressive symptoms of chest pressure, nausea, vomiting and dysphagia.   POSTOPERATIVE DIAGNOSIS:  Paraesophageal hiatal hernia with progressive symptoms of chest pressure, nausea, vomiting and dysphagia with prominent hiatus associated with increased tension as part of the muscular repair prompting relaxing incision and coverage of iatrogenic defect.   PROCEDURE:  The patient was brought to the operating room, placed in the supine position.  General anesthesia obtained with orotracheal intubation.  The patient had a Foley catheter inserted sterilely.  TED hose and Thromboguards applied and the patient then placed in a low lithotomy position with Allen stirrups.  The patient then had prep and drape of the chest and abdomen.  A small incision made at the level of the umbilicus through which a Veress needle was introduced in the abdominal cavity which was confirmed by saline drop test.  A peri-pneumoperitoneum obtained with carbon dioxide.  A 5 mm Optiview trocar was then introduced in the left upper abdomen under direct visualization.  This was then followed by introduction of 3 additional trocars across the upper abdomen and a Nathanson liver retractor introduced through a subxiphoid defect.  Upon elevation of the left lobe of the liver the patient had a prominent hiatal hernia as previously noted by endoscopy and radiology.  The patient had traction on the gastrohepatic ligament which was then incised by use of a Harmonic  scalpel.  This was then carried across the anterior hiatus where the herniated peritoneum was incised across the anterior margin of the hiatus.  Blunt dissection was then used to create a preperitoneal plane separating the herniated peritoneum from the overlying pericardium.  Further traction was then placed on the herniated peritoneum and it was then incised just lateral to the right crural margin.  Blunt dissection was then used to initiate reduction of herniated lesser sac fatty tissue.  As this was done portions of the herniated stomach were able to be reduced into the abdominal cavity.  The patient then had division of short gastric vessels along the upper outer curvature of the stomach.  This was then followed by freeing of the upper stomach from the undersurface of the left hemidiaphragm.  Then phrenoesophageal ligaments then divided along the (Dictation Anomaly)medial aspect of the left crus, effort made to spare as much peritoneum as possible on the crural musculature.  At this point dissection of the lower esophagus performed within the mediastinum sweeping the esophagus away from the underlying aorta and pleural surfaces by a combination of blunt dissection and some use of the Harmonic scalpel.  Care was taken to sweep the anterior and posterior vagal nerves towards the esophagus as dissection was extended into the lower mediastinum.  A Penrose drain was passed around the GE junction to assist with traction.  Next, additional mediastinal dissection performed further mobilizing the esophagus from the pleural surfaces, the aorta and the pericardium.  The dissection was extended up until a point in which the pulmonary vasculature was identifiable.  On the right side there was prominence of lymph nodes noted in the region of the pulmonary vasculature, some  of which were bisected in an effort to avoid entry into the pleura and avoid a power source adjacent to the posterior vagal nerve.  Similar lymphovascular  attachments divided on the lateral aspect of the esophagus as it was swept away from the pleura on the left side.  With this circumferential dissection we were able to (Dictation Anomaly)deliver 4 cm of the esophagus lying comfortably in the abdominal cavity as confirmed by tape measuring device.  At this point, a 60 French bougie was passed transorally.  This was then used as an aid in sizing of the hiatal closure.  Three interrupted 0 Ethibond sutures were placed posteriorly approximately in the crural musculature.  As this was being done there was no evidence of increasing tension on the diaphragmatic musculature and a relaxing incision of the fascia was felt to be appropriate to reduce potential for future recurrence.  This was accomplished by division of the thickened fascia at a point midway between the vena cava and the upper medial margin of the hiatus.  The fascia incised with use of a Harmonic scalpel exposing underlying peripleural fat pad.  The release was extended inferiorly to a point parallel with the upper suture.  The release was then extended in a curvilinear fashion superiorly following the margins of the hiatus to a point in which the fascia was noted by the pericardium.  This resulted in an iatrogenic defect that measured approximately 4-1/2 cm in length and it broadened to a diameter of approximately 1.5 cm.  This resulted in significant reduction of the diaphragmatic musculature at the level of the hiatus.  No additional sutures felt to be necessary at this time.  The iatrogenic defect was then covered with a sheet of Accell xenograft patch.  This was positioned in a somewhat exaggerated C configuration with the base of the membrane directly against the abdominal contents.  This was secured to the diaphragm with the use of a 10 mm hernia stapler.  Care was taken to avoid contact between the prosthesis and the body of the esophagus.  Next, the upper stomach delivered through the retroesophageal  window and a 161 degree fundoplication was configured.  The new upper greater curvature of the stomach was brought into proximity to the retroesophageal portion of the stomach at the level of the distal esophagus.  The initial suture did include a portion of the anterior esophagus lateral to the anterior vagal nerve which was identified and preserved.  A second suture was placed above this.  Next, additional sutures were placed at the 2 and 10 o'clock position to further fixate the fundoplication to the anterior esophagus.  A 5 mm instrument was placed through the wrap adjacent to the dilated esophagus revealing what was felt to be adequate residual laxity of a fundoplication around the distal esophagus and this was performed with the bougie in place within the gastric lumen.  At this point, the dilator was withdrawn.  The posterior apex of the wrap was secured to the underlying diaphragmatic musculature with a 2-0 Monocryl suture using seromuscular purchase of the stomach.  This was done to prevent proximal migration of the apex of the wrap in the lower mediastinum.  In addition to further fixate the stomach into the abdominal cavity, traction was placed down the body of the stomach, an area was chosen for fixation of this to the anterior abdominal wall.  A 2-0 chromic suture was passed by way of a Keith needle into the abdominal cavity at the site  used previously for the [facility].  The needle was introduced into the body of the stomach just above the incisura along the lesser curvature aspect of the stomach and the needle re-delivered through the anterior abdominal wall and placed on upper traction.  Next, three T fasteners were introduced percutaneously and anchored around the initial suture.  These all were then brought up gradually by way of traction bringing the anterior stomach into opposition with the peritoneum.  The sutures and T fasteners were then fixed.  Hemostasis was confirmed and at  this point no additional intervention felt to be necessary.  The pneumoperitoneum relieved.  The trocar was removed.  The wound was injected with 0.25% Marcaine with epinephrine followed by closure of the dermis with 4-0 Monocryl followed by Dermabond.  The patient at this time allowed to recover having tolerated the procedure well.     ____________________________ Venia Carbon. Duke Salvia, MD mat:ea D: 10/21/2012 23:41:00 ET T: 10/22/2012 00:50:50 ET JOB#: [id number]  cc: Legrand Como A. Duke Salvia, MD, <Dictator> Robert Bellow, MD Ladora Daniel MD ELECTRONICALLY SIGNED 11/03/2012 15:09

## 2014-06-03 NOTE — Consult Note (Signed)
I spoke to Dr. Marlan Palau nurse and will get Dr. Fleet Contras to see this patient for evaluation for possible surgery.  Electronic Signatures: Manya Silvas (MD)  (Signed on 23-Apr-14 14:35)  Authored  Last Updated: 23-Apr-14 14:35 by Manya Silvas (MD)

## 2014-06-03 NOTE — Consult Note (Signed)
CC: anemia and melena.  Pt with several days of melena, severe iron def anemia.  Plan EGD this afternoon and colonoscopy tomorrow and capsule to follow if culprit not found.  See dictated consult.  Pt had work up 9 months ago which was neg.  These things can be difficult, esp if a small Dieulafoy ulcer is the source.   Electronic Signatures: Manya Silvas (MD)  (Signed on 23-Apr-14 07:45)  Authored  Last Updated: 23-Apr-14 07:45 by Manya Silvas (MD)

## 2014-06-03 NOTE — Consult Note (Signed)
CC: anemia, melena.  Pt with EGD showing large hiatal hernia, multiple erosions in the proximal stomach associated with the hiatal hernia.  No blood and no active bleeding.  Abnormality of duodenum with deformity, no ulceration.  Likely had bleeding from the proximal gastric erosions associated with the hiatal hernia.  Will suggest surgical consult.  I will speak to surgeon.  Avoid all NSAID meds  Electronic Signatures: Manya Silvas (MD)  (Signed on 23-Apr-14 13:47)  Authored  Last Updated: 23-Apr-14 13:47 by Manya Silvas (MD)

## 2014-06-03 NOTE — Discharge Summary (Signed)
PATIENT NAME:  Gail Hardy, Gail Hardy MR#:  [id number] DATE OF BIRTH:  1945-05-14  DATE OF ADMISSION:  06/02/2012 DATE OF DISCHARGE:  06/04/2012  ADMISSION DIAGNOSES:  1.  Melena.  2.  Acute blood loss anemia.   DISCHARGE DIAGNOSES: 1.  Melena.  2.  Acute blood loss anemia, secondary to gastric erosions.  3.  History of bipolar affective disorder.  4.  Hyperlipidemia.   CONSULTS:  1.  Dr. [name].  2.  Dr. Bary Castilla.   PROCEDURES: The patient underwent an EGD on 06/03/2012 which showed a hiatal hernia, gastric ulcers with clean base, likely cause of her anemia, normal atrium and duodenal deformities.   Upper GI study showed limited evaluation secondary to the patient's limited ingestion of oral contrast. There is a large paraesophageal hiatal hernia, ectasia at the esophagus.   Discharge white blood cells 6.4, hemoglobin 8, hematocrit 25.3, platelets are 279.   Sodium 141, potassium 3.8, chloride 110, bicarb 27, BUN 16, creatinine 0.64, glucose is 94.   Vitamin B12 was greater than 1999.   HOSPITAL COURSE: A 69 year old female who presented with symptomatic acute blood loss anemia and melena. For further details, please refer to the H and P.   1.  Melena, likely secondary to an upper GI bleed. She underwent an EGD which showed gastric erosions. It was felt that the hiatal hernia could possibly have caused the gastric erosions. Therefore Dr. Bary Castilla was consulted. Dr. Bary Castilla feels that it may not be, but did recommend Carafate, following up with him up in 1 week. She also had an upper GI, with results as above. Dr. [name] recommended PPI, Carafate, and ferrous sulfate, and the patient will follow up in  4 weeks. The patient was told that if she was to develop symptomatic anemia or acute melena should report back to the ER. The other thing is that she could have Dieulafoy lesions, which would be hard to evaluate unless the patient is actively bleeding.  2.  Blood loss anemia, status post 2  units of PRBCs: Her hemoglobin has remained stable over the last 3 days.  3.  Bipolar affective disorder: The patient will resume her Abilify and Celexa.  4.  Hyperlipidemia: The patient's statin was held during this hospitalization, however she may resume this at discharge.   DISCHARGE MEDICATIONS:  1.  Celexa 20 mg daily.  2.  Latanoprost 0.005%, 1 drop to each affected eye at bedtime.  3.  Requip 0.5 mg, 2 tablets at bedtime.  4.  Temazepam 30 mg, 1 tablet at bedtime.  5   Simvastatin, 20 mg at bedtime.  6.  Abilify 30 mg, one-half tablet daily.  7. Vitamin B12 1000 mcg, 1 tablet daily.  8.  Acetaminophen/oxycodone 325/5, 1 tablet q.4 hours p.r.n. pain.  9.  Iron polysaccharide 150 mg daily.  10.  Carafate 1 gram q.i.d.  11. Pantoprazole 40 mg daily.   DISCHARGE DIET: Low sodium.   DISCHARGE ACTIVITY: To be as tolerated.   DISCHARGE FOLLOWUP: The patient will follow up with Dr. Rosanna Randy and Dr. Bary Castilla 1 to  2 weeks, and in 4 to 6 weeks with Dr. [name].   Time spent: Approximately 35 minutes.  The patient is medically stable for discharge.     ____________________________ Kerrie Timm P. Benjie Karvonen, MD spm:dm D: 06/04/2012 13:59:14 ET T: 06/04/2012 14:27:40 ET JOB#: [id number]  cc: Gordon Carlson P. Benjie Karvonen, MD, <Dictator> Richard L. Rosanna Randy, MD Manya Silvas, MD Dawna Jakes P Mekesha Solomon MD ELECTRONICALLY SIGNED 06/04/2012 15:01

## 2014-06-03 NOTE — Consult Note (Signed)
PATIENT NAME:  Gail Hardy, Gail Hardy MR#:  [id number] DATE OF BIRTH:  08-May-1945  DATE OF CONSULTATION:  06/03/2012  REFERRING PHYSICIAN:  Gaylyn Cheers, MD.  CONSULTING PHYSICIAN:  Robert Bellow, MD.  PRIMARY PHYSICIAN:  Miguel Aschoff, MD.   INDICATION FOR CONSULTATION: Hiatal hernia, GI bleeding.     CLINICAL NOTE: This 69 year old woman has been admitted twice in the last year with profound microcytic anemia. During her first evaluation in September 2013, she underwent upper and lower endoscopy as well as capsule endoscopy without a site of bleeding identified. She reported on admission a 2-week history of black stools. When she began to feel weak, she presented for assessment. She was found to have a low hemoglobin and was admitted for transfusion. She underwent a repeat upper endoscopy on April 23 under the care of [employer]. He found multiple erosions and a hiatal hernia. The question is at present whether elective hiatal hernia surgery would be of benefit.   The patient reports that she has intermittent dysphagia, especially for chicken and bread. This may necessitate regurgitation to clear her esophagus. She otherwise has no history of significant vomiting of undigested food. The patient reports a 7-pound weight loss, intentional based on dietary modification. She denies any significant history of acid reflux.   Active medical issues include elevated cholesterol, restless leg syndrome, iron deficiency anemia, and a previous stroke after clip application for a middle cerebral artery aneurysm.   The patient was hemodynamically stable throughout her stay. Laboratory studies were notable for an admission hemoglobin of 6.9, improving to 8.3 after transfusion. Hemoglobin MCV was 61. Normal white count and differential. Normal renal function and liver function studies. At the time of her October 2013 presentation, her hemoglobin was 6.2. Highest value recorded was 12.4 in December 2013. At  that time, her MCV had risen to 80.   CT of the abdomen in 2010 showed evidence of a moderate-sized hiatal hernia. Her most current CT shows no significant difference.   Endoscopy films were reviewed.   The erosions present at this time and the hiatal hernia may be the sole source for bleeding, although the volume of bleeding would seem to suggest a more active source such as a Dieulafoy's ulcer.   Upper GI and barium swallow completed today showed ectasia and mild kinking of the distal thoracic esophagus. A large paraesophageal hiatal hernia is identified. No evidence of gastroesophageal reflux. The 12.5 mm barium tablet traversed the esophagus and hiatal hernia without incident. The stomach was unremarkable. Normal rotation. The study was felt compromised due to limited ingestion of oral contrast.   The previous CTs had not suggested a paraesophageal hernia. This may indeed require surgical repair with the erosions identified; again, it may not be the sole source for bleeding.   At this time, the patient can be discharged home with the addition of Carafate to her anti-acid regimen and arrangements made for outpatient followup in my office in 3 to 4 weeks.   Thanks for the opportunity of seeing this patient.    ____________________________ Robert Bellow, MD jwb:gb D: 06/04/2012 21:37:00 ET T: 06/04/2012 22:54:33 ET JOB#: [id number]  cc: Robert Bellow, MD, <Dictator> Manya Silvas, MD Richard L. Rosanna Randy, MD Geralda Baumgardner Amedeo Kinsman MD ELECTRONICALLY SIGNED 06/05/2012 9:25

## 2014-06-03 NOTE — Discharge Summary (Signed)
PATIENT NAME:  Gail Hardy, Gail Hardy MR#:  [id number] DATE OF BIRTH:  1945-10-20  DATE OF ADMISSION:  10/20/2012 DATE OF DISCHARGE:  10/22/2012  DISCHARGE DIAGNOSIS: Paraesophageal hernia.   CLINICAL NOTE: This 70 year old woman who was having episodic chest pain. Evaluation showed evidence of a sizable paraesophageal hernia. She was felt to be a candidate for repair. She underwent repair of her paraesophageal hernia, Nissen fundoplication, gastropexy and mesh reinforcement of the hiatus on 10/20/2012 under the care of Arletta Bale, M.D. She was begun on clear liquids that day and her diet was gradually advanced to a soft diet over the next 48 hours. Her cardiopulmonary exam remained unremarkable. At the time of discharge she was felt to be a candidate for home management. Her regular medications were reinitiated as documented on the home-going instruction sheet. She was advised to make use of a soft diet and to avoid breads and meets until further notice.   The patient tolerated her hospitalization well without any known complications.    ____________________________ Robert Bellow, MD jwb:dp D: 11/06/2012 10:07:18 ET T: 11/06/2012 11:25:56 ET JOB#: [id number]  cc: Robert Bellow, MD, <Dictator> Venia Carbon. Duke Salvia, MD Richard L. Rosanna Randy, MD Winona Sison Amedeo Kinsman MD ELECTRONICALLY SIGNED 11/09/2012 15:28

## 2014-06-03 NOTE — H&P (Signed)
PATIENT NAME:  Gail Hardy, Gail Hardy MR#:  [id number] DATE OF BIRTH:  12-05-1945  DATE OF ADMISSION:  06/02/2012   PRIMARY CARE PHYSICIAN:  Dr. Rosanna Randy.  ONCOLOGIST:  Dr. Kallie Edward.  CHIEF COMPLAINT:   The patient was brought in from [facility] because of anemia and black stools.   HISTORY OF PRESENT ILLNESS:   A 69 year old female patient with history of depression and previous history of anemia, brought from Dr. Elvera Maria office because the patient blood work showed hemoglobin of 6.9 and hematocrit of 22.7. The patient complains of some black stool for the past 2 weeks. The patient was brought in as a direct admit.  Saw the patient in the room. The patient says that for the last 2 weeks, black stools are associated with some epigastric pain. No nausea. No vomiting. No loss of appetite. No weight loss. The patient denies any other complaints. The patient's last hemoglobin in December was 12.4.  The patient also denies any use of NSAIDs on a regular basis.   PAST MEDICAL HISTORY:  Significant for a history of depression, glaucoma and hyperlipidemia. The patient had an EGD and colonoscopy recently, in September of last year by Dr. Dionne Milo, and the EGD showed some hiatal hernia and normal stomach, abnormal  duodenal fold.  Colonoscopy, at that time, showed a polyp in the descending colon, which was resected.  The patient's past medical history also includes history of cerebral aneurysm, operated in 2011. History of PE in 2011. Chronic headaches. She also has a history of GERD.   PAST SURGICAL HISTORY:  Significant for aneurysm repair in the brain, hysterectomy and cholecystectomy.   HOME MEDICATIONS:    Abilify 30 mg p.o. daily, Celexa 20 mg p.o. daily,  cyanocobalamin 1000 mcg p.o. daily, , simvastatin 20 mg daily, temazepam 30 mg at bedtime.  ALLERGIES:  BARIUM AND XANAX.   FAMILY HISTORY:  No hypertension or diabetes.   REVIEW OF SYSTEMS: CONSTITUTIONAL:  The patient feels fatigued and tired. No  fever.  HEAD:  No headache.  EARS, NOSE, THROAT:  No epistaxis. No ear pain and no throat pain.  CARDIOVASCULAR:  Denies any chest pain.  GASTROINTESTINAL:  Has black stools for 2 weeks. No nausea. No vomiting.  LUNGS:  Denies any wheezing, no trouble breathing.  EXTREMITIES:  No extremity edema. The patient denies any joint pains.   PHYSICAL EXAMINATION: VITAL SIGNS:  Blood pressure 122/89, pulse is 92, and the patient's sat is 99% on room air.  GENERAL:  She is alert, awake, oriented. Denies any complaints.  Looks pale.  HEAD AND EYES: Head atraumatic, normocephalic. Pupils equally reacting to light. Extraocular movements intact. The patient's conjunctivae is very pale.   EARS, NOSE, THROAT:  No tympanic membrane congestion. No turbinate hypertrophy. No oropharyngeal erythema.  NECK:  Normal range of motion. No JVD. No carotid bruits.  CARDIOVASCULAR:  S1 and S2 regular. No murmurs.  LUNGS:  Clear to auscultation. No wheeze. No rales.  ABDOMEN:  Soft. Mild epigastric tenderness present. No rebound tenderness. No organomegaly. No hernias.  EXTREMITIES:  No extremity edema. No cyanosis. No clubbing.  NEUROLOGIC:  Alert, awake and oriented. Cranial nerves II through XII are intact. Power 5/5 upper and lower extremities. Senses are intact. DTRs 2+ bilaterally.  PSYCHIATRIC:  Mood and affect are within normal limits. Looks slightly anxious. Has tremors, which are benign essential tremors. She said it is due to Abilify.     LABORATORY, DIAGNOSTIC AND RADIOLOGICAL DATA:  Electrolytes: Sodium 142, potassium 4,  chloride 105, bicarb 22, BUN 14, creatinine 1, glucose 150, rest of electrolytes within normal limits. WBC 7.7, hemoglobin 6.9, hematocrit 22.7 and platelets 366. The patient's hemoglobin in December was 12.4.    ASSESSMENT AND PLAN:  A 69 year old female patient with weakness, dizziness, black stools associated with significant anemia, with hb 6.9 which is acute. The patient is going to be  admitted to telemetry for acute anemia with melena. We will give her 2 units of transfusion, continue IV PPIs, GI consult with Dr. [name]. The patient will  have CBC q.8 hours. Depression, continue home medications of Abilify and Celexa. The patient follows up with Dr. Kallie Edward for severe iron deficiency anemia. She was admitted in December for symptomatic anemia, and the patient's EGD and colonoscopy were done in September. Nothing major on the EGD and colonoscopy, and she also had a capsule endoscope at that time in September, did not show any bleeding. The patient received a blood transfusion at that time in September, and also iron in November. Now comes with anemia and dizziness and black stools, so she needs GI workup. Continue PPIs, consult GI. The patient initially was tachycardic at Dr. Elvera Maria office,  but now she is not tachycardic. We will continue IV hydration.   TIME SPENT ON THE HISTORY AND PHYSICAL: About 55 minutes.     ____________________________ Epifanio Lesches, MD sk:dmm D: 06/02/2012 17:26:22 ET T: 06/02/2012 20:11:30 ET JOB#: [id number]  cc: Epifanio Lesches, MD, <Dictator> Richard L. Rosanna Randy, MD Epifanio Lesches MD ELECTRONICALLY SIGNED 06/22/2012 11:42

## 2014-06-03 NOTE — Consult Note (Signed)
PATIENT NAME:  Gail Hardy, Gail Hardy MR#:  [id number] DATE OF BIRTH:  07-Aug-1945  DATE OF CONSULTATION:  06/03/2012  CONSULTING PHYSICIAN:  Manya Silvas, MD  HISTORY OF PRESENT ILLNESS: The patient is a 68 year old white female who has severe iron deficiency anemia. She reports a history of several days of melena. She denies taking any Pepto-Bismol or iron preparations. She presented with a hemoglobin of 6.9. She has been transfused, and hemoglobin is up to 8.3 with a couple of units. I was asked to see her in consultation.   The patient had the same exact clinical scenario in September and had a colonoscopy, upper endoscopy and capsule study with negative findings.   REVIEW OF SYSTEMS: She denies any sores in the mouth. No trouble swallowing. She has a hiatal hernia, occasionally has difficulty with food passing. Mild heartburn and reflux symptoms. She has had a history of ulcers in the past. She is not constipated and does not have diarrhea. Normally moves 1 stool a day, sometimes 1 to 3 bowel movements a day. She denies abdominal pain. No hematemesis. No hematochezia. She does clearly have melena though, without iron or Pepto-Bismol. She denies any dysuria or hematuria. No vaginal bleeding. No breast lumps, no nipple discharge.   PAST MEDICAL HISTORY: She has had a partial hysterectomy, appendectomy, gallbladder surgery. Also, a history of depression, glaucoma and hyperlipidemia. History of a cerebral aneurysm operated on in 2011, history of a PE in 2011. She does have chronic headaches.   MEDICATIONS ON ADMISSION:  1. Abilify 30 mg a day.  2. Celexa 20 mg a day.  3. Vitamin B12 1000 mcg a day.  4. Simvastatin 20 mg a day.  5. Temazepam 30 mg at bedtime.   ALLERGIES: BARIUM AND XANAX. SHE HAD AN ADMISSION WHEN SHE WAS HAVING ALTERED MENTAL STATUS FROM [facility].   FAMILY HISTORY: Negative for cancer in parents or first-degree relatives.   PHYSICAL EXAMINATION:  GENERAL: A white  female in no acute distress. She has a significant tremor. She relates that to the Abilify.  HEENT: Sclerae nonicteric. Conjunctivae pale. Tongue negative.  NECK: Shows no carotid bruits. No masses.  HEART: Normal S1 and S2. No murmurs.  CHEST: Clear to auscultation.  ABDOMEN: Soft. No hepatosplenomegaly. No masses. No bruits. Slight tenderness in epigastric area.  EXTREMITIES: She has compression devices on. No palpable edema  PSYCHIATRIC: The patient is an appropriate historian, answers questions well. Awake, alert and oriented.   LABORATORY DATA: Shows a hemoglobin of 6.9, hematocrit 22.7. Hemoglobin in December was 12.4. White count 7.7. BUN 14, creatinine 1, potassium 4, bicarbonate 22. Platelet count 366.   ASSESSMENT: Melena and hypochromic microcytic indices, previous similar presentation.   PLAN: Upper endoscopy today and then colonoscopy tomorrow. If that is negative, then repeat capsule study.    ____________________________ Manya Silvas, MD rte:OSi D: 06/03/2012 07:51:21 ET T: 06/03/2012 08:11:38 ET JOB#: [id number]  cc: Manya Silvas, MD, <Dictator> Manya Silvas MD ELECTRONICALLY SIGNED 06/24/2012 14:13

## 2014-08-26 ENCOUNTER — Other Ambulatory Visit: Payer: Self-pay | Admitting: Specialist

## 2014-08-26 DIAGNOSIS — S73102A Unspecified sprain of left hip, initial encounter: Secondary | ICD-10-CM

## 2014-08-29 ENCOUNTER — Ambulatory Visit
Admission: RE | Admit: 2014-08-29 | Discharge: 2014-08-29 | Disposition: A | Discharge status: Home / Self Care | Payer: PPO | Source: Ambulatory Visit | Attending: Specialist | Admitting: Specialist

## 2014-08-29 ENCOUNTER — Other Ambulatory Visit: Payer: Self-pay | Admitting: Family Medicine

## 2014-08-29 DIAGNOSIS — M25552 Pain in left hip: Secondary | ICD-10-CM | POA: Insufficient documentation

## 2014-08-29 DIAGNOSIS — S73102A Unspecified sprain of left hip, initial encounter: Secondary | ICD-10-CM

## 2014-08-31 NOTE — Telephone Encounter (Signed)
Do you want both of these refilled [facility]

## 2014-09-19 ENCOUNTER — Other Ambulatory Visit: Payer: Self-pay | Admitting: Family Medicine

## 2014-09-20 ENCOUNTER — Ambulatory Visit (INDEPENDENT_AMBULATORY_CARE_PROVIDER_SITE_OTHER): Payer: PPO | Admitting: Family Medicine

## 2014-09-20 ENCOUNTER — Encounter: Payer: Self-pay | Admitting: Family Medicine

## 2014-09-20 VITALS — BP 120/70 | HR 90 | Temp 98.1°F | Resp 16 | Ht 61.0 in | Wt 204.0 lb

## 2014-09-20 DIAGNOSIS — F32A Depression, unspecified: Secondary | ICD-10-CM | POA: Insufficient documentation

## 2014-09-20 DIAGNOSIS — R000 Tachycardia, unspecified: Secondary | ICD-10-CM | POA: Insufficient documentation

## 2014-09-20 DIAGNOSIS — F329 Major depressive disorder, single episode, unspecified: Secondary | ICD-10-CM | POA: Insufficient documentation

## 2014-09-20 DIAGNOSIS — Z1239 Encounter for other screening for malignant neoplasm of breast: Secondary | ICD-10-CM

## 2014-09-20 DIAGNOSIS — B379 Candidiasis, unspecified: Secondary | ICD-10-CM | POA: Insufficient documentation

## 2014-09-20 DIAGNOSIS — F419 Anxiety disorder, unspecified: Secondary | ICD-10-CM | POA: Insufficient documentation

## 2014-09-20 DIAGNOSIS — E669 Obesity, unspecified: Secondary | ICD-10-CM | POA: Diagnosis not present

## 2014-09-20 DIAGNOSIS — E663 Overweight: Secondary | ICD-10-CM | POA: Insufficient documentation

## 2014-09-20 DIAGNOSIS — M5430 Sciatica, unspecified side: Secondary | ICD-10-CM | POA: Insufficient documentation

## 2014-09-20 DIAGNOSIS — R413 Other amnesia: Secondary | ICD-10-CM | POA: Insufficient documentation

## 2014-09-20 DIAGNOSIS — G4700 Insomnia, unspecified: Secondary | ICD-10-CM | POA: Insufficient documentation

## 2014-09-20 DIAGNOSIS — I639 Cerebral infarction, unspecified: Secondary | ICD-10-CM | POA: Insufficient documentation

## 2014-09-20 DIAGNOSIS — I6782 Cerebral ischemia: Secondary | ICD-10-CM | POA: Insufficient documentation

## 2014-09-20 DIAGNOSIS — R748 Abnormal levels of other serum enzymes: Secondary | ICD-10-CM | POA: Insufficient documentation

## 2014-09-20 DIAGNOSIS — IMO0002 Reserved for concepts with insufficient information to code with codable children: Secondary | ICD-10-CM | POA: Insufficient documentation

## 2014-09-20 DIAGNOSIS — K529 Noninfective gastroenteritis and colitis, unspecified: Secondary | ICD-10-CM | POA: Insufficient documentation

## 2014-09-20 DIAGNOSIS — G2581 Restless legs syndrome: Secondary | ICD-10-CM | POA: Insufficient documentation

## 2014-09-20 DIAGNOSIS — E7800 Pure hypercholesterolemia, unspecified: Secondary | ICD-10-CM | POA: Insufficient documentation

## 2014-09-20 DIAGNOSIS — G8929 Other chronic pain: Secondary | ICD-10-CM | POA: Insufficient documentation

## 2014-09-20 DIAGNOSIS — J309 Allergic rhinitis, unspecified: Secondary | ICD-10-CM | POA: Insufficient documentation

## 2014-09-20 DIAGNOSIS — F40240 Claustrophobia: Secondary | ICD-10-CM | POA: Insufficient documentation

## 2014-09-20 DIAGNOSIS — Z0000 Encounter for general adult medical examination without abnormal findings: Secondary | ICD-10-CM | POA: Diagnosis not present

## 2014-09-20 DIAGNOSIS — I671 Cerebral aneurysm, nonruptured: Secondary | ICD-10-CM | POA: Insufficient documentation

## 2014-09-20 DIAGNOSIS — J860 Pyothorax with fistula: Secondary | ICD-10-CM | POA: Insufficient documentation

## 2014-09-20 DIAGNOSIS — R519 Headache, unspecified: Secondary | ICD-10-CM | POA: Insufficient documentation

## 2014-09-20 DIAGNOSIS — R5381 Other malaise: Secondary | ICD-10-CM | POA: Insufficient documentation

## 2014-09-20 DIAGNOSIS — K259 Gastric ulcer, unspecified as acute or chronic, without hemorrhage or perforation: Secondary | ICD-10-CM | POA: Insufficient documentation

## 2014-09-20 DIAGNOSIS — I2699 Other pulmonary embolism without acute cor pulmonale: Secondary | ICD-10-CM | POA: Insufficient documentation

## 2014-09-20 DIAGNOSIS — K449 Diaphragmatic hernia without obstruction or gangrene: Secondary | ICD-10-CM | POA: Insufficient documentation

## 2014-09-20 DIAGNOSIS — R251 Tremor, unspecified: Secondary | ICD-10-CM | POA: Insufficient documentation

## 2014-09-20 DIAGNOSIS — F319 Bipolar disorder, unspecified: Secondary | ICD-10-CM | POA: Insufficient documentation

## 2014-09-20 DIAGNOSIS — F05 Delirium due to known physiological condition: Secondary | ICD-10-CM | POA: Insufficient documentation

## 2014-09-20 DIAGNOSIS — K228 Other specified diseases of esophagus: Secondary | ICD-10-CM | POA: Insufficient documentation

## 2014-09-20 DIAGNOSIS — M6281 Muscle weakness (generalized): Secondary | ICD-10-CM | POA: Insufficient documentation

## 2014-09-20 DIAGNOSIS — D649 Anemia, unspecified: Secondary | ICD-10-CM | POA: Insufficient documentation

## 2014-09-20 DIAGNOSIS — G20 Parkinson's disease: Secondary | ICD-10-CM | POA: Insufficient documentation

## 2014-09-20 DIAGNOSIS — G939 Disorder of brain, unspecified: Secondary | ICD-10-CM | POA: Insufficient documentation

## 2014-09-20 DIAGNOSIS — D500 Iron deficiency anemia secondary to blood loss (chronic): Secondary | ICD-10-CM | POA: Insufficient documentation

## 2014-09-20 DIAGNOSIS — R5383 Other fatigue: Secondary | ICD-10-CM

## 2014-09-20 DIAGNOSIS — IMO0001 Reserved for inherently not codable concepts without codable children: Secondary | ICD-10-CM | POA: Insufficient documentation

## 2014-09-20 DIAGNOSIS — R51 Headache: Secondary | ICD-10-CM

## 2014-09-20 DIAGNOSIS — R531 Weakness: Secondary | ICD-10-CM | POA: Insufficient documentation

## 2014-09-20 DIAGNOSIS — G723 Periodic paralysis: Secondary | ICD-10-CM | POA: Insufficient documentation

## 2014-09-20 MED ORDER — PHENTERMINE HCL 37.5 MG PO TABS
37.5000 mg | ORAL_TABLET | Freq: Every day | ORAL | Status: DC
Start: 2014-09-20 — End: 2015-03-15

## 2014-09-20 NOTE — Progress Notes (Signed)
Patient ID: Gail Hardy, female   DOB: 07-01-1945, 69 y.o.   MRN: [id number]       Patient: Gail Hardy, Female    DOB: 1945-09-11, 69 y.o.   MRN: [id number] Visit Date: 09/20/2014  Today's Provider: Wilhemena Durie, MD   Chief Complaint  Patient presents with  . Medicare Wellness   Subjective:    Annual wellness visit Gail Hardy is a 69 y.o. female. She feels well. She reports exercising with therapist. She reports she is sleeping well.  Pap-hysterectomy 08/04/13 EKG 06/03/12 ENDO no EGD 08/19/13 Prevnar   Lab Results  Component Value Date   WBC 7.4 06/17/2013   HGB 13.8 06/17/2013   HCT 41.6 06/17/2013   PLT 210 06/17/2013   GLUCOSE 80 10/13/2012   ALT 22 06/02/2012   AST 15 06/02/2012   NA 139 10/13/2012   K 3.6 10/13/2012   CL 107 10/13/2012   CREATININE 0.73 10/13/2012   BUN 18 10/13/2012   CO2 27 10/13/2012     -----------------------------------------------------------   Review of Systems  Constitutional: Positive for activity change.  HENT: Negative.   Eyes: Negative.   Respiratory: Negative.   Cardiovascular: Negative.   Gastrointestinal: Negative.   Endocrine: Negative.   Genitourinary: Negative.   Musculoskeletal: Positive for arthralgias.  Skin: Negative.   Allergic/Immunologic: Negative.   Neurological: Positive for tremors and weakness.  Hematological: Bruises/bleeds easily.  Psychiatric/Behavioral: The patient is nervous/anxious.     History   Social History  . Marital Status: Married    Spouse Name: Gail Hardy  . Number of Children: 2  . Years of Education: N/A   Occupational History  . Not on file.   Social History Main Topics  . Smoking status: Former Smoker -- 10 years    Types: Cigarettes    Quit date: 02/10/1989  . Smokeless tobacco: Never Used  . Alcohol Use: No  . Drug Use: No  . Sexual Activity: Not on file   Other Topics Concern  . Not on file   Social History Narrative    Patient Active  Problem List   Diagnosis Date Noted  . Allergic rhinitis 09/20/2014  . Anxiety disorder 09/20/2014  . Absolute anemia 09/20/2014  . Back ache 09/20/2014  . Bipolar 1 disorder, depressed 09/20/2014  . Body mass index of 60 or higher 09/20/2014  . Anemia due to blood loss 09/20/2014  . Aneurysm, cerebral 09/20/2014  . Chronic headache 09/20/2014  . Claustrophobia 09/20/2014  . Cerebral vascular accident 09/20/2014  . Acute confusion due to medical condition 09/20/2014  . Clinical depression 09/20/2014  . Abnormal liver enzymes 09/20/2014  . 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 Diaphragmatic hernia 09/20/2014  . Gastric erosion 09/20/2014  . Enterogastritis 09/20/2014  . 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 Hypercholesteremia 09/20/2014  . Cannot sleep 09/20/2014  . Malaise and fatigue 09/20/2014  . Bad memory 09/20/2014  . Altered blood in stool 09/20/2014  . Candida glabrata infection 09/20/2014  . Decreased motor strength 09/20/2014  . Adiposity 09/20/2014  . Arthritis, degenerative 09/20/2014  . Excess weight 09/20/2014  . Paralysis agitans 09/20/2014  . Breakdown 09/20/2014  . PE (pulmonary embolism) 09/20/2014  . Hypercholesterolemia without hypertriglyceridemia 09/20/2014  . Restless leg 09/20/2014  . Neuralgia neuritis, sciatic nerve 09/20/2014  . Gastric ulcer 09/20/2014  . Fast heart beat 09/20/2014  . Temporary cerebral vascular dysfunction 09/20/2014  . Has a tremor 09/20/2014  . Adynamia 09/20/2014  . Classical migraine with intractable migraine 08/10/2013  .  Dysphagia, unspecified(787.20) 08/05/2012  . Paraesophageal hernia 08/04/2012    Past Surgical History  Procedure Laterality Date  . Abdominal hysterectomy    . Cholecystectomy    . Upper gi endoscopy  2013, 2014    [facility]  . Colonoscopy  2013    Ifthikhar  . Hernia repair  2014    lap paraesophageal repair    Her family history includes COPD in her mother; Emphysema in her father;  Heart disease in her brother and mother.    Previous Medications   ABILIFY 30 MG TABLET    Take 30 mg by mouth daily.    ALPRAZOLAM (XANAX) 0.5 MG TABLET    Take 1 tablet by mouth as needed.   BUPROPION (WELLBUTRIN SR) 150 MG 12 HR TABLET    TAKE 1 TABLET BY MOUTH ONCE A DAY   CITALOPRAM (CELEXA) 20 MG TABLET    Take 20 mg by mouth daily.    CYANOCOBALAMIN 1000 MCG CAPS    Take 1 capsule by mouth daily.   DIPHENOXYLATE-ATROPINE (LOMOTIL) 2.5-0.025 MG PER TABLET    Take 1 tablet by mouth as needed.   FERROUS SULFATE 325 (65 FE) MG TABLET    Take 325 mg by mouth 2 (two) times daily.   LATANOPROST (XALATAN) 0.005 % OPHTHALMIC SOLUTION    Place 1 drop into both eyes at bedtime.    ONDANSETRON (ZOFRAN-ODT) 4 MG DISINTEGRATING TABLET    Take 1 tablet by mouth as needed.   PHENTERMINE (ADIPEX-P) 37.5 MG TABLET    Take 1 tablet by mouth daily.   SIMVASTATIN (ZOCOR) 20 MG TABLET    Take 1 tablet by mouth at bedtime.   TEMAZEPAM (RESTORIL) 30 MG CAPSULE    TAKE 1 CAPSULE BY MOUTH ONCE DAILY    Patient Care Team: Jerrol Banana., MD as PCP - General (Family Medicine) Robert Bellow, MD (General Surgery)     Objective:   Vitals: BP 120/70 mmHg  Pulse 90  Temp(Src) 98.1 F (36.7 C) (Oral)  Resp 16  Ht 5\' 1"  (1.549 m)  Wt 204 lb (92.534 kg)  BMI 38.57 kg/m2  SpO2 96%  Physical Exam  Activities of Daily Living In your present state of health, do you have any difficulty performing the following activities: 09/20/2014  Hearing? N  Vision? N  Difficulty concentrating or making decisions? N  Walking or climbing stairs? Y  Dressing or bathing? Y  Doing errands, shopping? Y    Fall Risk Assessment Fall Risk  09/20/2014  Falls in the past year? Yes  Number falls in past yr: 2 or more  Injury with Fall? Yes  Risk for fall due to : Impaired balance/gait     Depression Screen PHQ 2/9 Scores 09/20/2014  PHQ - 2 Score 6  PHQ- 9 Score 9    Cognitive Testing - 6-CIT  Correct? Score    What year is it? yes 0 0 or 4  What month is it? yes 0 0 or 3  Memorize:    Gail Hardy,  42,  [place],      What time is it? (within 1 hour) yes 0 0 or 3  Count backwards from 20 yes 0 0, 2, or 4  Name the months of the year yes 4 0, 2, or 4  Repeat name & address above yes 3 0, 2, 4, 6, 8, or 10       TOTAL SCORE  7/28   Interpretation:  Normal  Normal (0-7) Abnormal (8-28)       Assessment & Plan:     Annual Wellness Visit  Reviewed patient's Family Medical History Reviewed and updated list of patient's medical providers Assessment of cognitive impairment was done Assessed patient's functional ability Established a written schedule for health screening [place] Completed and Reviewed  Exercise Activities and Dietary recommendations Goals    . Exercise 150 minutes per week (moderate activity)        There is no immunization history on file for this patient.  Health Maintenance  Topic Date Due  . Hepatitis C Screening  1945/04/27  . TETANUS/TDAP  08/13/1964  . MAMMOGRAM  08/14/1995  . COLONOSCOPY  08/14/1995  . ZOSTAVAX  08/13/2005  . DEXA SCAN  08/14/2010  . PNA vac Low Risk Adult (1 of 2 - PCV13) 08/14/2010  . INFLUENZA VACCINE  09/12/2014   Morbid obesity At least some of this is contributed to by medication for her bipolar disorder. [facility] [place] helps her with weight loss with this. I think due to mobility issues it is not unwise to continue the phentermine for now. She does ambulate with a walker now due to arthritic issues and her obesity. Husband is almost always with the patient.      ------------------------------------------------------------------------------------------------------------

## 2014-09-29 ENCOUNTER — Other Ambulatory Visit: Payer: Self-pay | Admitting: Specialist

## 2014-09-29 DIAGNOSIS — M79652 Pain in left thigh: Secondary | ICD-10-CM

## 2014-10-06 ENCOUNTER — Encounter
Admission: RE | Admit: 2014-10-06 | Discharge: 2014-10-06 | Disposition: A | Discharge status: Home / Self Care | Payer: PPO | Source: Ambulatory Visit | Attending: Specialist | Admitting: Specialist

## 2014-10-06 DIAGNOSIS — M79652 Pain in left thigh: Secondary | ICD-10-CM | POA: Insufficient documentation

## 2014-10-06 MED ORDER — TECHNETIUM TC 99M MEDRONATE IV KIT
25.0000 | PACK | Freq: Once | INTRAVENOUS | Status: AC | PRN
  Administered 2014-10-06: 24.11 via INTRAVENOUS

## 2014-10-13 ENCOUNTER — Encounter: Payer: Self-pay | Admitting: Family Medicine

## 2014-10-13 ENCOUNTER — Ambulatory Visit (INDEPENDENT_AMBULATORY_CARE_PROVIDER_SITE_OTHER): Payer: PPO | Admitting: Family Medicine

## 2014-10-13 ENCOUNTER — Encounter
Admission: RE | Admit: 2014-10-13 | Discharge: 2014-10-13 | Disposition: A | Discharge status: Home / Self Care | Payer: PPO | Source: Ambulatory Visit | Attending: Specialist | Admitting: Specialist

## 2014-10-13 VITALS — BP 112/72 | HR 100 | Temp 97.7°F | Resp 16 | Ht 61.0 in | Wt 208.0 lb

## 2014-10-13 DIAGNOSIS — Z01812 Encounter for preprocedural laboratory examination: Secondary | ICD-10-CM | POA: Diagnosis not present

## 2014-10-13 DIAGNOSIS — S72002A Fracture of unspecified part of neck of left femur, initial encounter for closed fracture: Secondary | ICD-10-CM

## 2014-10-13 DIAGNOSIS — Z01810 Encounter for preprocedural cardiovascular examination: Secondary | ICD-10-CM | POA: Insufficient documentation

## 2014-10-13 DIAGNOSIS — Z8673 Personal history of transient ischemic attack (TIA), and cerebral infarction without residual deficits: Secondary | ICD-10-CM

## 2014-10-13 HISTORY — DX: Family history of other specified conditions: Z84.89

## 2014-10-13 LAB — URINALYSIS COMPLETE WITH MICROSCOPIC (ARMC ONLY)
BILIRUBIN URINE: NEGATIVE
Bacteria, UA: NONE SEEN
GLUCOSE, UA: NEGATIVE mg/dL
KETONES UR: NEGATIVE mg/dL
Nitrite: NEGATIVE
Protein, ur: NEGATIVE mg/dL
Specific Gravity, Urine: 1.018 (ref 1.005–1.030)
pH: 5 (ref 5.0–8.0)

## 2014-10-13 LAB — BASIC METABOLIC PANEL
Anion gap: 9 (ref 5–15)
BUN: 23 mg/dL — ABNORMAL HIGH (ref 6–20)
CHLORIDE: 104 mmol/L (ref 101–111)
CO2: 26 mmol/L (ref 22–32)
CREATININE: 0.87 mg/dL (ref 0.44–1.00)
Calcium: 9.5 mg/dL (ref 8.9–10.3)
GFR calc non Af Amer: >60 mL/min (ref >60)
Glucose, Bld: 88 mg/dL (ref 65–99)
Potassium: 4.2 mmol/L (ref 3.5–5.1)
Sodium: 139 mmol/L (ref 135–145)

## 2014-10-13 LAB — PROTIME-INR
INR: 0.96
Prothrombin Time: 13 seconds (ref 11.4–15.0)

## 2014-10-13 LAB — TYPE AND SCREEN
ABO/RH(D): B POS
Antibody Screen: NEGATIVE

## 2014-10-13 LAB — CBC
HEMATOCRIT: 43.8 % (ref 35.0–47.0)
HEMOGLOBIN: 14.6 g/dL (ref 12.0–16.0)
MCH: 29.6 pg (ref 26.0–34.0)
MCHC: 33.3 g/dL (ref 32.0–36.0)
MCV: 89.1 fL (ref 80.0–100.0)
Platelets: 215 10*3/uL (ref 150–440)
RBC: 4.91 MIL/uL (ref 3.80–5.20)
RDW: 15.4 % — ABNORMAL HIGH (ref 11.5–14.5)
WBC: 6.1 10*3/uL (ref 3.6–11.0)

## 2014-10-13 MED ORDER — HYDROCODONE-ACETAMINOPHEN 10-325 MG PO TABS: 1.0000 | ORAL_TABLET | Freq: Three times a day (TID) | ORAL | Status: DC | PRN

## 2014-10-13 NOTE — Progress Notes (Signed)
Patient: Gail Hardy Female    DOB: 1945-04-12   69 y.o.   MRN: [id number] Visit Date: 10/13/2014  Today's Provider: Wilhemena Durie, MD   Chief Complaint  Patient presents with  . Medical Clearance    (Surgical Clearance)   Subjective:    HPI Surgical Clearance: Patient is here today to be cleared for an upcomming surgery. Planned procedure: Left hip TFN scheduled 10/19/2014. Surgeon will be Dr. Sabra Heck from [facility]. Patient feels fairly well today. Patient has pain in her left hip.   Specifically patient is having trouble sleeping because of the pain and asked for some pain medication. She also specifically has no chest pain chest tightness dyspnea on exertion or any neurologic symptoms.  Allergies  Allergen Reactions  . Diazepam     Other reaction(s): Feeling agitated (finding)   Previous Medications   ABILIFY 30 MG TABLET    Take 15 mg by mouth daily.    ALPRAZOLAM (XANAX) 0.5 MG TABLET    Take 1 tablet by mouth as needed.   BUPROPION (WELLBUTRIN SR) 150 MG 12 HR TABLET    TAKE 1 TABLET BY MOUTH ONCE A DAY   CITALOPRAM (CELEXA) 20 MG TABLET    Take 40 mg by mouth every morning.    CLONAZEPAM (KLONOPIN) 1 MG TABLET    Take 1 mg by mouth 2 (two) times daily. As needed for anxiety   CYANOCOBALAMIN 1000 MCG CAPS    Take 1 capsule by mouth daily.   DIPHENOXYLATE-ATROPINE (LOMOTIL) 2.5-0.025 MG PER TABLET    TAKE ONE TABLET BY MOUTH EVERY 6 HOURS AS NEEDED FOR DIARRHEA   DIPHENOXYLATE-ATROPINE (LOMOTIL) 2.5-0.025 MG PER TABLET    Take 1 tablet by mouth as needed.   FERROUS SULFATE 325 (65 FE) MG TABLET    Take 325 mg by mouth daily with breakfast.    LATANOPROST (XALATAN) 0.005 % OPHTHALMIC SOLUTION    Place 1 drop into both eyes at bedtime.    ONDANSETRON (ZOFRAN-ODT) 4 MG DISINTEGRATING TABLET    Take 1 tablet by mouth as needed.   PHENTERMINE (ADIPEX-P) 37.5 MG TABLET    Take 1 tablet (37.5 mg total) by mouth daily.   SIMVASTATIN (ZOCOR) 20 MG TABLET     Take 1 tablet by mouth at bedtime.   TEMAZEPAM (RESTORIL) 30 MG CAPSULE    TAKE 1 CAPSULE BY MOUTH ONCE DAILY    Review of Systems  Constitutional: Negative for fever, chills, appetite change and fatigue.  Respiratory: Negative for chest tightness and shortness of breath.   Cardiovascular: Negative for chest pain and palpitations.  Gastrointestinal: Negative for nausea, vomiting and abdominal pain.  Musculoskeletal: Positive for arthralgias (left hip pain).  Neurological: Negative for dizziness and weakness.    Social History  Substance Use Topics  . Smoking status: Former Smoker -- 10 years    Types: Cigarettes    Quit date: 02/10/1989  . Smokeless tobacco: Never Used  . Alcohol Use: No   Objective:   BP 112/72 mmHg  Pulse 100  Temp(Src) 97.7 F (36.5 C)  Resp 16  Ht 5\' 1"  (1.549 m)  Wt 208 lb (94.348 kg)  BMI 39.32 kg/m2  SpO2 99%  Physical Exam  Constitutional: She appears well-developed and well-nourished.  Obese white female in no acute distress  HENT:  Head: Normocephalic and atraumatic.  Right Ear: External ear normal.  Left Ear: External ear normal.  Nose: Nose normal.  Eyes: Conjunctivae are normal.  Neck: Neck supple.  Cardiovascular: Regular rhythm and normal heart sounds.   Pulmonary/Chest: Effort normal and breath sounds normal.  Abdominal: Soft.  Skin: Skin is warm and dry.  Psychiatric: She has a normal mood and affect. Her behavior is normal. Judgment and thought content normal.        Assessment & Plan:     Left Hip Fracture Patient medically clear for surgery. Prescription written for Norco 10/325 #55 to be taken so she can rest Morbid obesity Bipolar depression        Wilhemena Durie, MD  [facility]

## 2014-10-13 NOTE — Patient Instructions (Signed)
  Your procedure is scheduled on: Wednesday Sept 7, 2016. Report to Same Day Surgery. To find out your arrival time please call [phone] between 1PM - 3PM on Tuesday Sept. 2016.  Remember: Instructions that are not followed completely may result in serious medical risk, up to and including death, or upon the discretion of your surgeon and anesthesiologist your surgery may need to be rescheduled.    __x__ 1. Do not eat food or drink liquids after midnight. No gum chewing or hard candies.     ____ 2. No Alcohol for 24 hours before or after surgery.   ____ 3. Bring all medications with you on the day of surgery if instructed.    __x__ 4. Notify your doctor if there is any change in your medical condition     (cold, fever, infections).     Do not wear jewelry, make-up, hairpins, clips or nail polish.  Do not wear lotions, powders, or perfumes. You may wear deodorant.  Do not shave 48 hours prior to surgery. Men may shave face and neck.  Do not bring valuables to the hospital.    [facility] is not responsible for any belongings or valuables.               Contacts, dentures or bridgework may not be worn into surgery.  Leave your suitcase in the car. After surgery it may be brought to your room.  For patients admitted to the hospital, discharge time is determined by your treatment team.   Patients discharged the day of surgery will not be allowed to drive home.    Please read over the following fact sheets that you were given:   [facility] Preparing for Surgery  _x__ Take these medicines the morning of surgery with A SIP OF WATER:    1. Abilify  2. Welbutrin  3. Celexa  4.clonazePAM (KLONOPIN) if needed.   ____ Fleet Enema (as directed)   _x_ Use CHG Soap as directed  ____ Use inhalers on the day of surgery  ____ Stop metformin 2 days prior to surgery    ____ Take 1/2 of usual insulin dose the night before surgery and none on the morning of surgery.   ____ Stop  Coumadin/Plavix/aspirin on does not apply.  _x___ Stop Anti-inflammatories Ibuprofen until after surgery. Tylenol is OK to take for pain.   ____ Stop supplements until after surgery.    ____ Bring C-Pap to the hospital.

## 2014-10-14 LAB — ABO/RH: ABO/RH(D): B POS

## 2014-10-18 NOTE — H&P (Signed)
Gail Hardy is an 69 y.o. female.   Chief Complaint: Left hip pain HPI: 69 yo female fell around 07/25/14 injuring the left hip.  She had persistent pain and limp despite rest and use of analgesic and a walker.  X-rays have remained normal but a bone scan shows uptake along the inferior left femoral neck consistent with a stress fracture. MRI cannot be done due to brain implants. No evidence of tumor on scan or CT of hip.   I have recommended nailing of the hip to relieve stress and pain and allow for healing.  Risks and benefits discussed with patient and husband at length.  Plan nailing left hip with TFN device.    Past Medical History  Diagnosis Date  . Aneurysm 2011  . Blood transfusion without reported diagnosis 2013,2014    x 2  . Glaucoma   . Hyperlipidemia   . Anemia   . Hernia   . Depression   . Pulmonary emboli 2011  . Cancer   . Family history of adverse reaction to anesthesia     daughter nausea and vomiting  . Stroke 2010    Past Surgical History  Procedure Laterality Date  . Abdominal hysterectomy    . Cholecystectomy    . Upper gi endoscopy  2013, 2014    [facility]  . Colonoscopy  2013    Ifthikhar  . Cerebral aneurysm repair  2011    stent and coil insertion  . Hernia repair  2014    lap paraesophageal repair    Family History  Problem Relation Age of Onset  . Emphysema Father   . COPD Mother   . Heart disease Mother   . Heart disease Brother    Social History:  reports that she quit smoking about 25 years ago. Her smoking use included Cigarettes. She quit after 10 years of use. She has never used smokeless tobacco. She reports that she does not drink alcohol or use illicit drugs.  Allergies:  Allergies  Allergen Reactions  . Diazepam     Other reaction(s): Feeling agitated (finding)    No prescriptions prior to admission    No results found for this or any previous visit (from the past 48 hour(s)). No results found.  Review of Systems   Constitutional: Negative.   HENT: Negative.   Eyes: Negative.   Respiratory: Negative.   Cardiovascular: Negative.   Gastrointestinal: Negative.   Genitourinary: Negative.   Skin: Negative.   Neurological: Negative.   Endo/Heme/Allergies: Negative.   Psychiatric/Behavioral: Positive for depression.    There were no vitals taken for this visit. Physical Exam  Constitutional: She is oriented to person, place, and time. She appears well-developed and well-nourished.  HENT:  Head: Normocephalic.  Eyes: Pupils are equal, round, and reactive to light.  Neck: Normal range of motion.  Cardiovascular: Normal rate and regular rhythm.   Respiratory: Effort normal.  GI: Soft.  Musculoskeletal: She exhibits tenderness.  Pain with rom of left hip.  NV normal distally.  Right hip normal.    Neurological: She is alert and oriented to person, place, and time.  Skin: Skin is warm and dry.  Psychiatric: She has a normal mood and affect.     Assessment/Plan Left hip nailing for stress fracture.  Gail Hardy 10/18/2014, 10:32 PM

## 2014-10-19 ENCOUNTER — Encounter: Payer: Self-pay | Admitting: Anesthesiology

## 2014-10-19 ENCOUNTER — Inpatient Hospital Stay: Payer: PPO

## 2014-10-19 ENCOUNTER — Inpatient Hospital Stay: Payer: PPO | Admitting: Anesthesiology

## 2014-10-19 ENCOUNTER — Inpatient Hospital Stay
Admission: AD | Admit: 2014-10-19 | Discharge: 2014-10-23 | DRG: 481 | Disposition: A | Discharge status: Home / Self Care | Payer: PPO | Source: Ambulatory Visit | Attending: Specialist | Admitting: Specialist

## 2014-10-19 ENCOUNTER — Encounter
Admission: AD | Disposition: A | Discharge status: Home / Self Care | Payer: Self-pay | Source: Ambulatory Visit | Attending: Specialist

## 2014-10-19 DIAGNOSIS — Z8673 Personal history of transient ischemic attack (TIA), and cerebral infarction without residual deficits: Secondary | ICD-10-CM

## 2014-10-19 DIAGNOSIS — Z87891 Personal history of nicotine dependence: Secondary | ICD-10-CM

## 2014-10-19 DIAGNOSIS — Z86711 Personal history of pulmonary embolism: Secondary | ICD-10-CM

## 2014-10-19 DIAGNOSIS — M84359A Stress fracture, hip, unspecified, initial encounter for fracture: Secondary | ICD-10-CM | POA: Diagnosis present

## 2014-10-19 DIAGNOSIS — Z825 Family history of asthma and other chronic lower respiratory diseases: Secondary | ICD-10-CM | POA: Diagnosis not present

## 2014-10-19 DIAGNOSIS — F319 Bipolar disorder, unspecified: Secondary | ICD-10-CM | POA: Diagnosis present

## 2014-10-19 DIAGNOSIS — J441 Chronic obstructive pulmonary disease with (acute) exacerbation: Secondary | ICD-10-CM | POA: Diagnosis not present

## 2014-10-19 DIAGNOSIS — M84359G Stress fracture, hip, unspecified, subsequent encounter for fracture with delayed healing: Secondary | ICD-10-CM | POA: Diagnosis present

## 2014-10-19 DIAGNOSIS — Z8249 Family history of ischemic heart disease and other diseases of the circulatory system: Secondary | ICD-10-CM

## 2014-10-19 DIAGNOSIS — J45909 Unspecified asthma, uncomplicated: Secondary | ICD-10-CM | POA: Diagnosis present

## 2014-10-19 DIAGNOSIS — I739 Peripheral vascular disease, unspecified: Secondary | ICD-10-CM | POA: Diagnosis present

## 2014-10-19 DIAGNOSIS — R0902 Hypoxemia: Secondary | ICD-10-CM | POA: Diagnosis not present

## 2014-10-19 DIAGNOSIS — E785 Hyperlipidemia, unspecified: Secondary | ICD-10-CM | POA: Diagnosis present

## 2014-10-19 DIAGNOSIS — Z9049 Acquired absence of other specified parts of digestive tract: Secondary | ICD-10-CM | POA: Diagnosis present

## 2014-10-19 DIAGNOSIS — L8990 Pressure ulcer of unspecified site, unspecified stage: Secondary | ICD-10-CM | POA: Diagnosis not present

## 2014-10-19 DIAGNOSIS — Z419 Encounter for procedure for purposes other than remedying health state, unspecified: Secondary | ICD-10-CM

## 2014-10-19 DIAGNOSIS — F419 Anxiety disorder, unspecified: Secondary | ICD-10-CM | POA: Diagnosis present

## 2014-10-19 DIAGNOSIS — Z9181 History of falling: Secondary | ICD-10-CM | POA: Diagnosis not present

## 2014-10-19 DIAGNOSIS — H409 Unspecified glaucoma: Secondary | ICD-10-CM | POA: Diagnosis present

## 2014-10-19 HISTORY — PX: INTRAMEDULLARY (IM) NAIL INTERTROCHANTERIC: SHX5875

## 2014-10-19 SURGERY — FIXATION, FRACTURE, INTERTROCHANTERIC, WITH INTRAMEDULLARY ROD
Anesthesia: Choice | Site: Hip | Laterality: Left | Wound class: Clean

## 2014-10-19 MED ORDER — TEMAZEPAM 15 MG PO CAPS
30.0000 mg | ORAL_CAPSULE | Freq: Every day | ORAL | Status: DC
  Administered 2014-10-19 – 2014-10-22 (×4): 30 mg via ORAL
  Filled 2014-10-19 (×4): qty 2

## 2014-10-19 MED ORDER — FERROUS SULFATE 325 (65 FE) MG PO TABS
325.0000 mg | ORAL_TABLET | Freq: Every day | ORAL | Status: DC
  Administered 2014-10-20 – 2014-10-23 (×4): 325 mg via ORAL
  Filled 2014-10-19 (×4): qty 1

## 2014-10-19 MED ORDER — ZOLPIDEM TARTRATE 5 MG PO TABS: 5.0000 mg | ORAL_TABLET | Freq: Every evening | ORAL | Status: DC | PRN

## 2014-10-19 MED ORDER — MORPHINE SULFATE (PF) 2 MG/ML IV SOLN
1.0000 mg | INTRAVENOUS | Status: DC | PRN
  Administered 2014-10-19: 2 mg via INTRAVENOUS
  Administered 2014-10-19: 1 mg via INTRAVENOUS
  Filled 2014-10-19 (×2): qty 1

## 2014-10-19 MED ORDER — ONDANSETRON HCL 4 MG/2ML IJ SOLN: 4.0000 mg | Freq: Once | INTRAMUSCULAR | Status: DC | PRN

## 2014-10-19 MED ORDER — METOCLOPRAMIDE HCL 5 MG PO TABS
5.0000 mg | ORAL_TABLET | Freq: Three times a day (TID) | ORAL | Status: DC | PRN
  Filled 2014-10-19: qty 2

## 2014-10-19 MED ORDER — PHENOL 1.4 % MT LIQD: 1.0000 | OROMUCOSAL | Status: DC | PRN

## 2014-10-19 MED ORDER — FENTANYL CITRATE (PF) 100 MCG/2ML IJ SOLN
INTRAMUSCULAR | Status: AC
  Administered 2014-10-19: 25 ug via INTRAVENOUS
  Filled 2014-10-19: qty 2

## 2014-10-19 MED ORDER — RIVAROXABAN 10 MG PO TABS
10.0000 mg | ORAL_TABLET | Freq: Every day | ORAL | Status: DC
  Administered 2014-10-20 – 2014-10-23 (×4): 10 mg via ORAL
  Filled 2014-10-19 (×4): qty 1

## 2014-10-19 MED ORDER — SODIUM CHLORIDE 0.45 % IV SOLN
INTRAVENOUS | Status: DC
  Administered 2014-10-19 – 2014-10-20 (×2): via INTRAVENOUS

## 2014-10-19 MED ORDER — ONDANSETRON 4 MG PO TBDP: 4.0000 mg | ORAL_TABLET | ORAL | Status: DC | PRN

## 2014-10-19 MED ORDER — DIPHENOXYLATE-ATROPINE 2.5-0.025 MG PO TABS: 1.0000 | ORAL_TABLET | Freq: Four times a day (QID) | ORAL | Status: DC | PRN

## 2014-10-19 MED ORDER — CYANOCOBALAMIN 500 MCG PO TABS
1000.0000 ug | ORAL_TABLET | Freq: Every day | ORAL | Status: DC
  Administered 2014-10-19 – 2014-10-23 (×5): 1000 ug via ORAL
  Filled 2014-10-19 (×5): qty 2

## 2014-10-19 MED ORDER — MAGNESIUM HYDROXIDE 400 MG/5ML PO SUSP
30.0000 mL | Freq: Every day | ORAL | Status: DC | PRN
  Administered 2014-10-21: 30 mL via ORAL
  Filled 2014-10-19: qty 30

## 2014-10-19 MED ORDER — CLINDAMYCIN PHOSPHATE 900 MG/50ML IV SOLN
INTRAVENOUS | Status: AC
  Filled 2014-10-19: qty 50

## 2014-10-19 MED ORDER — PHENTERMINE HCL 37.5 MG PO TABS: 37.5000 mg | ORAL_TABLET | Freq: Every day | ORAL | Status: DC

## 2014-10-19 MED ORDER — SODIUM CHLORIDE 0.9 % IV SOLN
10000.0000 ug | INTRAVENOUS | Status: DC | PRN
  Administered 2014-10-19: 30 ug/min via INTRAVENOUS

## 2014-10-19 MED ORDER — METOCLOPRAMIDE HCL 5 MG/ML IJ SOLN: 5.0000 mg | Freq: Three times a day (TID) | INTRAMUSCULAR | Status: DC | PRN

## 2014-10-19 MED ORDER — ONDANSETRON HCL 4 MG/2ML IJ SOLN
INTRAMUSCULAR | Status: DC | PRN
  Administered 2014-10-19: 4 mg via INTRAVENOUS

## 2014-10-19 MED ORDER — BUPIVACAINE-EPINEPHRINE (PF) 0.25% -1:200000 IJ SOLN
INTRAMUSCULAR | Status: AC
  Filled 2014-10-19: qty 30

## 2014-10-19 MED ORDER — SIMVASTATIN 20 MG PO TABS
20.0000 mg | ORAL_TABLET | Freq: Every day | ORAL | Status: DC
  Administered 2014-10-19 – 2014-10-22 (×4): 20 mg via ORAL
  Filled 2014-10-19 (×4): qty 1

## 2014-10-19 MED ORDER — BUPROPION HCL ER (SR) 150 MG PO TB12
150.0000 mg | ORAL_TABLET | Freq: Every day | ORAL | Status: DC
  Administered 2014-10-20 – 2014-10-23 (×4): 150 mg via ORAL
  Filled 2014-10-19 (×4): qty 1

## 2014-10-19 MED ORDER — ACETAMINOPHEN 500 MG PO TABS
1000.0000 mg | ORAL_TABLET | Freq: Four times a day (QID) | ORAL | Status: AC
  Administered 2014-10-19: 1000 mg via ORAL
  Administered 2014-10-19: 500 mg via ORAL
  Administered 2014-10-20: 1000 mg via ORAL
  Filled 2014-10-19 (×3): qty 2

## 2014-10-19 MED ORDER — EPHEDRINE SULFATE 50 MG/ML IJ SOLN
INTRAMUSCULAR | Status: DC | PRN
  Administered 2014-10-19 (×3): 10 mg via INTRAVENOUS

## 2014-10-19 MED ORDER — SENNA 8.6 MG PO TABS
1.0000 | ORAL_TABLET | Freq: Two times a day (BID) | ORAL | Status: DC
Start: 2014-10-19 — End: 2014-10-23
  Administered 2014-10-19 – 2014-10-23 (×9): 8.6 mg via ORAL
  Filled 2014-10-19 (×9): qty 1

## 2014-10-19 MED ORDER — BISACODYL 10 MG RE SUPP
10.0000 mg | Freq: Every day | RECTAL | Status: DC | PRN
  Administered 2014-10-21: 10 mg via RECTAL

## 2014-10-19 MED ORDER — MENTHOL 3 MG MT LOZG: 1.0000 | LOZENGE | OROMUCOSAL | Status: DC | PRN

## 2014-10-19 MED ORDER — CLINDAMYCIN PHOSPHATE 900 MG/50ML IV SOLN: 900.0000 mg | Freq: Once | INTRAVENOUS | Status: DC

## 2014-10-19 MED ORDER — FAMOTIDINE 20 MG PO TABS
20.0000 mg | ORAL_TABLET | Freq: Once | ORAL | Status: AC
  Administered 2014-10-19: 20 mg via ORAL

## 2014-10-19 MED ORDER — ONDANSETRON HCL 4 MG PO TABS
4.0000 mg | ORAL_TABLET | Freq: Four times a day (QID) | ORAL | Status: DC | PRN
  Administered 2014-10-20: 4 mg via ORAL
  Filled 2014-10-19: qty 1

## 2014-10-19 MED ORDER — ACETAMINOPHEN 650 MG RE SUPP: 650.0000 mg | Freq: Four times a day (QID) | RECTAL | Status: DC | PRN

## 2014-10-19 MED ORDER — LATANOPROST 0.005 % OP SOLN
1.0000 [drp] | Freq: Every day | OPHTHALMIC | Status: DC
  Administered 2014-10-19 – 2014-10-22 (×4): 1 [drp] via OPHTHALMIC
  Filled 2014-10-19: qty 2.5

## 2014-10-19 MED ORDER — FENTANYL CITRATE (PF) 100 MCG/2ML IJ SOLN: 25.0000 ug | INTRAMUSCULAR | Status: DC | PRN

## 2014-10-19 MED ORDER — FLEET ENEMA 7-19 GM/118ML RE ENEM: 1.0000 | ENEMA | Freq: Once | RECTAL | Status: DC | PRN

## 2014-10-19 MED ORDER — ONDANSETRON HCL 4 MG/2ML IJ SOLN
4.0000 mg | Freq: Four times a day (QID) | INTRAMUSCULAR | Status: DC | PRN
  Administered 2014-10-19: 4 mg via INTRAVENOUS
  Filled 2014-10-19: qty 2

## 2014-10-19 MED ORDER — LACTATED RINGERS IV SOLN
INTRAVENOUS | Status: DC
  Administered 2014-10-19 (×3): via INTRAVENOUS

## 2014-10-19 MED ORDER — NEOMYCIN-POLYMYXIN B GU 40-200000 IR SOLN
Status: AC
  Filled 2014-10-19: qty 2

## 2014-10-19 MED ORDER — HYDROCODONE-ACETAMINOPHEN 5-325 MG PO TABS
1.0000 | ORAL_TABLET | Freq: Four times a day (QID) | ORAL | Status: DC | PRN
  Administered 2014-10-19 (×2): 1 via ORAL
  Administered 2014-10-20 (×2): 2 via ORAL
  Administered 2014-10-20: 1 via ORAL
  Administered 2014-10-21 – 2014-10-23 (×8): 2 via ORAL
  Filled 2014-10-19 (×3): qty 2
  Filled 2014-10-19: qty 1
  Filled 2014-10-19 (×3): qty 2
  Filled 2014-10-19 (×3): qty 1
  Filled 2014-10-19: qty 2
  Filled 2014-10-19: qty 1
  Filled 2014-10-19 (×2): qty 2

## 2014-10-19 MED ORDER — ALPRAZOLAM 0.5 MG PO TABS: 0.5000 mg | ORAL_TABLET | ORAL | Status: DC | PRN

## 2014-10-19 MED ORDER — PROPOFOL INFUSION 10 MG/ML OPTIME
INTRAVENOUS | Status: DC | PRN
  Administered 2014-10-19: 50 ug/kg/min via INTRAVENOUS

## 2014-10-19 MED ORDER — ALPRAZOLAM 0.5 MG PO TABS: 0.5000 mg | ORAL_TABLET | Freq: Four times a day (QID) | ORAL | Status: DC | PRN

## 2014-10-19 MED ORDER — PHENYLEPHRINE HCL 10 MG/ML IJ SOLN
INTRAMUSCULAR | Status: DC | PRN
  Administered 2014-10-19: 100 ug via INTRAVENOUS
  Administered 2014-10-19: 50 ug via INTRAVENOUS
  Administered 2014-10-19 (×3): 100 ug via INTRAVENOUS

## 2014-10-19 MED ORDER — FENTANYL CITRATE (PF) 100 MCG/2ML IJ SOLN
25.0000 ug | INTRAMUSCULAR | Status: DC | PRN
  Administered 2014-10-19 (×4): 25 ug via INTRAVENOUS

## 2014-10-19 MED ORDER — CEFAZOLIN SODIUM-DEXTROSE 2-3 GM-% IV SOLR
2.0000 g | Freq: Four times a day (QID) | INTRAVENOUS | Status: AC
  Administered 2014-10-19 (×2): 2 g via INTRAVENOUS
  Filled 2014-10-19 (×2): qty 50

## 2014-10-19 MED ORDER — TEMAZEPAM 15 MG PO CAPS: 30.0000 mg | ORAL_CAPSULE | Freq: Every day | ORAL | Status: DC

## 2014-10-19 MED ORDER — CLINDAMYCIN PHOSPHATE 600 MG/50ML IV SOLN
600.0000 mg | Freq: Three times a day (TID) | INTRAVENOUS | Status: AC
  Administered 2014-10-19 – 2014-10-20 (×3): 600 mg via INTRAVENOUS
  Filled 2014-10-19 (×3): qty 50

## 2014-10-19 MED ORDER — BUPIVACAINE-EPINEPHRINE (PF) 0.25% -1:200000 IJ SOLN
INTRAMUSCULAR | Status: DC | PRN
  Administered 2014-10-19: 30 mL via PERINEURAL

## 2014-10-19 MED ORDER — CEFAZOLIN SODIUM-DEXTROSE 2-3 GM-% IV SOLR
INTRAVENOUS | Status: AC
  Filled 2014-10-19: qty 50

## 2014-10-19 MED ORDER — MIDAZOLAM HCL 5 MG/5ML IJ SOLN
INTRAMUSCULAR | Status: DC | PRN
  Administered 2014-10-19 (×2): 1 mg via INTRAVENOUS

## 2014-10-19 MED ORDER — FENTANYL CITRATE (PF) 100 MCG/2ML IJ SOLN
INTRAMUSCULAR | Status: DC | PRN
  Administered 2014-10-19 (×2): 50 ug via INTRAVENOUS

## 2014-10-19 MED ORDER — ALUM & MAG HYDROXIDE-SIMETH 200-200-20 MG/5ML PO SUSP: 30.0000 mL | ORAL | Status: DC | PRN

## 2014-10-19 MED ORDER — FAMOTIDINE 20 MG PO TABS
ORAL_TABLET | ORAL | Status: AC
  Filled 2014-10-19: qty 1

## 2014-10-19 MED ORDER — NEOMYCIN-POLYMYXIN B GU 40-200000 IR SOLN
Status: DC | PRN
Start: 2014-10-19 — End: 2014-10-19
  Administered 2014-10-19: 2 mL

## 2014-10-19 MED ORDER — CITALOPRAM HYDROBROMIDE 20 MG PO TABS
40.0000 mg | ORAL_TABLET | ORAL | Status: DC
  Administered 2014-10-20 – 2014-10-23 (×3): 40 mg via ORAL
  Filled 2014-10-19 (×4): qty 2

## 2014-10-19 MED ORDER — ARIPIPRAZOLE 15 MG PO TABS
15.0000 mg | ORAL_TABLET | Freq: Every day | ORAL | Status: DC
  Administered 2014-10-20 – 2014-10-23 (×4): 15 mg via ORAL
  Filled 2014-10-19 (×4): qty 1

## 2014-10-19 MED ORDER — CEFAZOLIN SODIUM-DEXTROSE 2-3 GM-% IV SOLR: 2.0000 g | Freq: Once | INTRAVENOUS | Status: DC

## 2014-10-19 MED ORDER — MORPHINE SULFATE (PF) 2 MG/ML IV SOLN
2.0000 mg | INTRAVENOUS | Status: DC | PRN
  Administered 2014-10-19 – 2014-10-21 (×8): 2 mg via INTRAVENOUS
  Filled 2014-10-19 (×8): qty 1

## 2014-10-19 MED ORDER — ACETAMINOPHEN 325 MG PO TABS
650.0000 mg | ORAL_TABLET | Freq: Four times a day (QID) | ORAL | Status: DC | PRN
  Administered 2014-10-22: 650 mg via ORAL
  Filled 2014-10-19: qty 2

## 2014-10-19 MED ORDER — CLONAZEPAM 1 MG PO TABS
1.0000 mg | ORAL_TABLET | Freq: Two times a day (BID) | ORAL | Status: DC
  Administered 2014-10-19 – 2014-10-23 (×8): 1 mg via ORAL
  Filled 2014-10-19 (×8): qty 1

## 2014-10-19 SURGICAL SUPPLY — 34 items
BIT DRILL 4.0X195MM (BIT) ×1 IMPLANT
BIT DRILL GRAY 5X250 (MISCELLANEOUS) ×3 IMPLANT
BIT DRILL RADIO 4.0 (BIT) ×3 IMPLANT
BLADE INTRAMED NAIL 11X85 (Orthopedic Implant) ×2 IMPLANT
BLADE INTRAMED NAIL 11X85MML (Orthopedic Implant) ×1 IMPLANT
CANISTER SUCT 1200ML W/VALVE (MISCELLANEOUS) ×3 IMPLANT
CHLORAPREP W/TINT 26ML (MISCELLANEOUS) ×6 IMPLANT
DRAPE INCISE 23X17 IOBAN STRL (DRAPES) ×2
DRAPE INCISE IOBAN 23X17 STRL (DRAPES) ×1 IMPLANT
DRILL BIT 4.0X195MM (BIT) ×2
DRSG AQUACEL AG ADV 3.5X10 (GAUZE/BANDAGES/DRESSINGS) ×5 IMPLANT
GAUZE PETRO XEROFOAM 1X8 (MISCELLANEOUS) ×3 IMPLANT
GAUZE SPONGE 4X4 12PLY STRL (GAUZE/BANDAGES/DRESSINGS) ×3 IMPLANT
GLOVE INDICATOR 8.0 STRL GRN (GLOVE) ×3 IMPLANT
GLOVE SURG ORTHO 8.5 STRL (GLOVE) ×3 IMPLANT
GOWN STRL REUS W/ TWL LRG LVL3 (GOWN DISPOSABLE) ×2 IMPLANT
GOWN STRL REUS W/TWL LRG LVL3 (GOWN DISPOSABLE) ×6
GUIDEWIRE 3.2X400 (WIRE) ×3 IMPLANT
KIT RM TURNOVER STRD PROC AR (KITS) ×3 IMPLANT
MAT BLUE FLOOR 46X72 FLO (MISCELLANEOUS) ×3 IMPLANT
NAIL TROCH FX 11/130D 170-S (Nail) ×3 IMPLANT
NDL SPNL 18GX3.5 QUINCKE PK (NEEDLE) ×1 IMPLANT
NEEDLE SPNL 18GX3.5 QUINCKE PK (NEEDLE) ×3 IMPLANT
NS IRRIG 500ML POUR BTL (IV SOLUTION) ×3 IMPLANT
PACK HIP COMPR (MISCELLANEOUS) ×3 IMPLANT
PAD GROUND ADULT SPLIT (MISCELLANEOUS) ×3 IMPLANT
REAMER ROD DEEP FLUTE 2.5X950 (INSTRUMENTS) ×3 IMPLANT
SCREW LOCK TI 5.0X38 F/IM NAIL (Screw) ×2 IMPLANT
STAPLER SKIN PROX 35W (STAPLE) ×3 IMPLANT
SUCTION FRAZIER TIP 10 FR DISP (SUCTIONS) ×3 IMPLANT
SUT VIC AB 0 CT1 36 (SUTURE) ×3 IMPLANT
SUT VIC AB 2-0 CT1 27 (SUTURE) ×3
SUT VIC AB 2-0 CT1 TAPERPNT 27 (SUTURE) ×1 IMPLANT
SYR 30ML LL (SYRINGE) ×3 IMPLANT

## 2014-10-19 NOTE — Op Note (Signed)
DATE OF SURGERY:  10/19/2014  TIME: 9:35 AM  PATIENT NAME:  Gail Hardy  AGE: 69 y.o.  PRE-OPERATIVE DIAGNOSIS:  STRESS FRACTURE OF LEFT NECK FEMUR  POST-OPERATIVE DIAGNOSIS:  SAME  PROCEDURE:  INTRAMEDULLARY (IM) NAIL INTERTROCHANTERIC WITH TROCHANTERIC FIXATION NAIL  SURGEON:  Keoki Mchargue E     OPERATIVE IMPLANTS: Synthes trochanteric femoral nail 130/11 mm with interlocking helical blade 85 mm  and distal locking screw 38 mm  PREOPERATIVE INDICATIONS:  Gail Hardy is a 69 y.o. year old who fell 2 months ago and had continued left hip pain despite normal x-rays.  CT scan was unrevealing.  Bone scan showed increased uptake in the medial femoral neck consistent with stress fracture.  Due to unrelenting pain surgical fixation with a trochanteric fixation nail was recommended.   The patient and her husband then elected for surgical intervention.    The risks benefits and alternatives were discussed with the patient including but not limited to the risks of nonoperative treatment, versus surgical intervention including infection, bleeding, nerve injury, malunion, nonunion, hardware prominence, hardware failure, need for hardware removal, blood clots, cardiopulmonary complications, morbidity, mortality, among others, and they were willing to proceed.    OPERATIVE PROCEDURE:  The patient was brought to the operating room and placed in the supine position. General anesthesia was administered, with a foley. She was placed on the fracture table. Time out was then performed after sterile prep and drape. She received preoperative antibiotics.  Incision was made proximal to the greater trochanter. A guidewire was placed in the appropriate position. Confirmation was made on AP and lateral views. The above-named nail was opened. I opened the proximal femur with a reamer.  The patient appeared to have a very narrow canal, so I proceeded to ream the canal to 12.5 mm.  I then placed the nail  by hand easily down.    Once the nail was completely seated, I placed a guidepin into the femoral head into the center center position through a second incision.  I measured the length, and then reamed the lateral cortex and up into the head. I then placed the helical blade.  Anatomic fixation achieved. Bone quality was excellent.  I then secured the proximal interlock.  The distal locking screw was then placed and after confirming the position of the fracture fragments and hardware I then removed the instruments, and took final C-arm pictures AP and lateral the entire length of the leg.The wounds were irrigated copiously and closed with Vicryl  followed by staples and Aquacel for the skin. Sponge and needle count were correct.   The patient was awakened and returned to PACU in stable and satisfactory condition. There no complications and the patient tolerated the procedure well.  She will be  weightbearing as tolerated, and will be on Xarelto for DVT prophylaxis due to a history of prior pulmonary embolism and the need for 6 weeks of anticoagulation.  Park Breed, M.D.

## 2014-10-19 NOTE — Progress Notes (Signed)
PT PAIN NOT RELIEVED DR MILLER NOTIFIED AND ORDERS RECEIVED

## 2014-10-19 NOTE — Transfer of Care (Signed)
Immediate Anesthesia Transfer of Care Note  Patient: Gail Hardy  Procedure(s) Performed: Procedure(s): INTRAMEDULLARY (IM) NAIL INTERTROCHANTRIC (Left)  Patient Location: PACU  Anesthesia Type:Spinal  Level of Consciousness: awake, alert  and oriented  Airway & Oxygen Therapy: Patient Spontanous Breathing and Patient connected to nasal cannula oxygen  Post-op Assessment: Report given to RN and Post -op Vital signs reviewed and stable  Post vital signs: Reviewed and stable  Last Vitals:  Filed Vitals:   10/19/14 0940  BP: 123/64  Pulse: 78  Temp: 36.5 C  Resp: 13    Complications: No apparent anesthesia complications

## 2014-10-19 NOTE — H&P (Signed)
THE PATIENT WAS SEEN IN THE HOLDING AREA.  HISTORY, ALLERGIES, HOME MEDICATIONS AND OPERATIVE PROCEDURE WERE REVIEWED. RISKS AND BENEFITS OF SURGERY DISCUSSED WITH PATIENT AGAIN.  NO CHANGES FROM INITIAL HISTORY AND PHYSICAL NOTED.    

## 2014-10-19 NOTE — Anesthesia Preprocedure Evaluation (Addendum)
Anesthesia Evaluation  Patient identified by MRN, date of birth, ID band Patient awake    Reviewed: Allergy & Precautions, NPO status , Patient's Chart, lab work & pertinent test results, reviewed documented beta blocker date and time   History of Anesthesia Complications (+) Family history of anesthesia reaction  Airway Mallampati: II  TM Distance: >3 FB     Dental  (+) Chipped, Upper Dentures, Partial Lower   Pulmonary former smoker,           Cardiovascular + Peripheral Vascular Disease       Neuro/Psych  Headaches, PSYCHIATRIC DISORDERS Anxiety Depression Bipolar Disorder  Neuromuscular disease CVA    GI/Hepatic hiatal hernia, PUD,   Endo/Other    Renal/GU      Musculoskeletal  (+) Arthritis ,   Abdominal   Peds  Hematology  (+) anemia ,   Anesthesia Other Findings Still has residual weakness on the left from the stroke. Some confusion.  Reproductive/Obstetrics                            Anesthesia Physical Anesthesia Plan  ASA: III  Anesthesia Plan:    Post-op Pain Management:    Induction:   Airway Management Planned:   Additional Equipment:   Intra-op Plan:   Post-operative Plan:   Informed Consent: I have reviewed the patients History and Physical, chart, labs and discussed the procedure including the risks, benefits and alternatives for the proposed anesthesia with the patient or authorized representative who has indicated his/her understanding and acceptance.     Plan Discussed with: CRNA  Anesthesia Plan Comments:         Anesthesia Quick Evaluation

## 2014-10-19 NOTE — Anesthesia Procedure Notes (Signed)
Spinal Patient location during procedure: OR Staffing Anesthesiologist: Gunnar Bulla Performed by: anesthesiologist  Preanesthetic Checklist Completed: patient identified, site marked, surgical consent, pre-op evaluation, timeout performed, IV checked and risks and benefits discussed Spinal Block Patient position: sitting Prep: Betadine Patient monitoring: heart rate, cardiac monitor, continuous pulse ox and blood pressure Approach: midline Location: L3-4 Injection technique: single-shot Needle Needle type: Pencil-Tip  Needle gauge: 25 G Needle length: 9 cm Assessment Sensory level: T10 Additional Notes Marcaine 12.5mg  intrathecal.

## 2014-10-19 NOTE — Progress Notes (Signed)
PT SLEEPING AT PRESENT FAMILY AT BEDSIDE

## 2014-10-20 LAB — BASIC METABOLIC PANEL
Anion gap: 6 (ref 5–15)
BUN: 17 mg/dL (ref 6–20)
CALCIUM: 8.2 mg/dL — AB (ref 8.9–10.3)
CO2: 27 mmol/L (ref 22–32)
CREATININE: 0.93 mg/dL (ref 0.44–1.00)
Chloride: 106 mmol/L (ref 101–111)
GFR calc Af Amer: >60 mL/min (ref >60)
GLUCOSE: 110 mg/dL — AB (ref 65–99)
Potassium: 4 mmol/L (ref 3.5–5.1)
Sodium: 139 mmol/L (ref 135–145)

## 2014-10-20 LAB — CBC
HCT: 36.2 % (ref 35.0–47.0)
Hemoglobin: 12.6 g/dL (ref 12.0–16.0)
MCH: 30.6 pg (ref 26.0–34.0)
MCHC: 34.7 g/dL (ref 32.0–36.0)
MCV: 88.3 fL (ref 80.0–100.0)
PLATELETS: 180 10*3/uL (ref 150–440)
RBC: 4.1 MIL/uL (ref 3.80–5.20)
RDW: 15 % — AB (ref 11.5–14.5)
WBC: 7.1 10*3/uL (ref 3.6–11.0)

## 2014-10-20 NOTE — Progress Notes (Signed)
Physical Therapy Treatment Patient Details Name: Gail Hardy MRN: [id number] DOB: April 16, 1945 Today's Date: 10/20/2014    History of Present Illness admitted for acute hospitalization status post L hip ORIF/nailing for stress fracture (sustained status post fall in June 2016).  Per telephone clarification per Dr. Sabra Heck, patient okay for WBAT to L LE; activity as tolerated (no bedrest restrictions).    PT Comments    Patient with progressive increase in gait distance (30' with RW, min assist).  Fair/good weight acceptance through L LE without buckling/LOB.  Continues to rate pain at 10/10, but appears calm and restful during session.   Follow Up Recommendations  Home health PT     Equipment Recommendations       Recommendations for Other Services       Precautions / Restrictions Precautions Precautions: Fall Restrictions Weight Bearing Restrictions: Yes LLE Weight Bearing: Weight bearing as tolerated    Mobility  Bed Mobility Overal bed mobility: Needs Assistance Bed Mobility: Sit to Supine     Supine to sit: Min assist     General bed mobility comments: for L LE elevation over edge of bed surface  Transfers Overall transfer level: Needs assistance Equipment used: Rolling walker (2 wheeled) Transfers: Sit to/from Stand Sit to Stand: Min assist         General transfer comment: cuing for hand placement  Ambulation/Gait Ambulation/Gait assistance: Min assist Ambulation Distance (Feet): 30 Feet Assistive device: Rolling walker (2 wheeled)       General Gait Details: step to gait pattern with decreased step height/length bilat.  Fair stance time/weight acceptance to L LE without buckling/LOB.  Forward flexed posture with mod WBing bilat UEs.  Additional distance limited by pain/fatigue.   Stairs            Wheelchair Mobility    Modified Rankin (Stroke Patients Only)       Balance Overall balance assessment: Needs assistance Sitting-balance  support: No upper extremity supported;Feet supported Sitting balance-Leahy Scale: Good     Standing balance support: Bilateral upper extremity supported Standing balance-Leahy Scale: Fair                      Cognition                            Exercises Other Exercises Other Exercises: Sit/stand with RW x5, min assist--increased time and bilat UE support required to complete.  Improving LE position with better weight shift/acceptance to L LE noted this PM. Other Exercises: Bed/chair transfer with RW, cga/min assist.    General Comments        Pertinent Vitals/Pain Pain Score: 10-Worst pain ever Pain Location: L knee Pain Descriptors / Indicators: Aching Pain Intervention(s): Limited activity within patient's tolerance;Monitored during session;Premedicated before session;Repositioned    Home Living                      Prior Function            PT Goals (current goals can now be found in the care plan section) Acute Rehab PT Goals Patient Stated Goal: "to get up and move around without pain" PT Goal Formulation: With patient/family Time For Goal Achievement: 11/03/14 Potential to Achieve Goals: Good Progress towards PT goals: Progressing toward goals    Frequency  BID    PT Plan Current plan remains appropriate    Co-evaluation  End of Session Equipment Utilized During Treatment: Gait belt Activity Tolerance: Patient tolerated treatment well Patient left: with call bell/phone within reach;with family/visitor present;in bed;with bed alarm set     Time: 1406-1430 PT Time Calculation (min) (ACUTE ONLY): 24 min  Charges:  $Gait Training: 8-22 mins $Therapeutic Exercise: 8-22 mins $Therapeutic Activity: 8-22 mins                    G Codes:      Azarya Oconnell H. Owens Shark, PT, DPT, NCS 10/20/2014, 3:58 PM [phone]

## 2014-10-20 NOTE — Anesthesia Postprocedure Evaluation (Signed)
  Anesthesia Post-op Note  Patient: Gail Hardy  Procedure(s) Performed: Procedure(s): INTRAMEDULLARY (IM) NAIL INTERTROCHANTRIC (Left)  Anesthesia type:No value filed.  Patient location: PACU  Post pain: Pain level controlled  Post assessment: Post-op Vital signs reviewed, Patient's Cardiovascular Status Stable, Respiratory Function Stable, Patent Airway and No signs of Nausea or vomiting  Post vital signs: Reviewed and stable  Last Vitals:  Filed Vitals:   10/20/14 0810  BP: 93/47  Pulse: 104  Temp:   Resp:     Level of consciousness: awake, alert  and patient cooperative  Complications: No apparent anesthesia complications

## 2014-10-20 NOTE — Evaluation (Signed)
Physical Therapy Evaluation Patient Details Name: Gail Hardy MRN: [id number] DOB: [birth year] Today's Date: 10/20/2014   History of Present Illness  admitted for acute hospitalization status post L hip ORIF/nailing for stress fracture (sustained status post fall in June 2016).  Per telephone clarification per Dr. Sabra Heck, patient okay for WBAT to L LE; activity as tolerated (no bedrest restrictions).  Clinical Impression  Upon evaluation, patient lethargic (but arousable); follows all commands and participates with all evaluation components.  Rates pain in L hip at 10/10, but repeatedly closes eyes/falls asleep during conversation.  Demonstrates L hip strength at least 3-/5, tolerating 45 degrees active/80-90 degrees passive ROM to L hip; otherwise, bilat LE strength and ROM grossly WFL.  Able to complete bed mobility with min assist (increased time); sit/stand, basic transfers and short-distance gait (5') with RW, min assist.  No buckling/LOB, but unsafe to complete without RW and +1 assist at this time. Would benefit from skilled PT to address above deficits and promote optimal return to PLOF; Recommend transition to [place] upon discharge from acute hospitalization.     Follow Up Recommendations Home health PT    Equipment Recommendations       Recommendations for Other Services       Precautions / Restrictions Precautions Precautions: Fall Restrictions Weight Bearing Restrictions: Yes LLE Weight Bearing: Weight bearing as tolerated      Mobility  Bed Mobility Overal bed mobility: Needs Assistance Bed Mobility: Supine to Sit     Supine to sit: Min assist     General bed mobility comments: increased time, min cuing for technique  Transfers Overall transfer level: Needs assistance Equipment used: Rolling walker (2 wheeled) Transfers: Sit to/from Stand Sit to Stand: Min assist         General transfer comment: cuing for hand  placement  Ambulation/Gait Ambulation/Gait assistance: Min assist Ambulation Distance (Feet): 5 Feet Assistive device: Rolling walker (2 wheeled)       General Gait Details: step to gait pattern with decreased step height/length bilat.  Forward flexed posture with mod WBing bilat UEs.  Additional distance deferred secondary to pain.  Stairs            Wheelchair Mobility    Modified Rankin (Stroke Patients Only)       Balance Overall balance assessment: Needs assistance Sitting-balance support: No upper extremity supported;Feet supported Sitting balance-Leahy Scale: Good     Standing balance support: Bilateral upper extremity supported Standing balance-Leahy Scale: Fair                               Pertinent Vitals/Pain Pain Assessment: 0-10 Pain Score: 10-Worst pain ever Pain Descriptors / Indicators: Aching Pain Intervention(s): Limited activity within patient's tolerance;Monitored during session;Premedicated before session;Repositioned    Home Living Family/patient expects to be discharged to:: Private residence Living Arrangements: Spouse/significant other Available Help at Discharge: Family Type of Home: House Home Access: Stairs to enter Entrance Stairs-Rails:  (bilat rails, too far to reach both) Entrance Stairs-Number of Steps: 3 Home Layout: One level Home Equipment: Walker - 2 wheels;Walker - 4 wheels      Prior Function Level of Independence: Needs assistance         Comments: At baseline, indep with all household/community mobility; mod indep with RW, assist from husband for ADLs and household chores since fall in June.     Hand Dominance        Extremity/Trunk Assessment  Upper Extremity Assessment: Overall WFL for tasks assessed           Lower Extremity Assessment:  (L LE limited to approx 45 degrees active hip flexion, 80-90 degrees passive hip flexion (due to pain); strength at least 3-/5 throughout hip, 3+/5  throughout hip and knee.  L LE grossly WFL for strength and ROM.)         Communication   Communication: No difficulties  Cognition Arousal/Alertness: Lethargic Behavior During Therapy: Flat affect Overall Cognitive Status: Within Functional Limits for tasks assessed                      General Comments      Exercises        Assessment/Plan    PT Assessment Patient needs continued PT services  PT Diagnosis Difficulty walking;Generalized weakness;Acute pain   PT Problem List Decreased strength;Decreased range of motion;Decreased activity tolerance;Decreased balance;Decreased mobility;Decreased coordination;Decreased cognition;Decreased knowledge of use of DME;Decreased safety awareness;Decreased knowledge of precautions;Cardiopulmonary status limiting activity;Pain  PT Treatment Interventions DME instruction;Gait training;Stair training;Functional mobility training;Therapeutic activities;Therapeutic exercise;Balance training;Patient/family education   PT Goals (Current goals can be found in the Care Plan section) Acute Rehab PT Goals Patient Stated Goal: "to get up and move around without pain" PT Goal Formulation: With patient/family Time For Goal Achievement: 11/03/14 Potential to Achieve Goals: Good    Frequency BID   Barriers to discharge        Co-evaluation               End of Session Equipment Utilized During Treatment: Gait belt Activity Tolerance: Patient tolerated treatment well Patient left: in chair;with call bell/phone within reach;with chair alarm set;with family/visitor present Nurse Communication: Mobility status         Time: 9826-4158 PT Time Calculation (min) (ACUTE ONLY): 30 min   Charges:   PT Evaluation $Initial PT Evaluation Tier I: 1 Procedure PT Treatments $Therapeutic Exercise: 8-22 mins   PT G Codes:        Latonya Knight H. Owens Shark, PT, DPT, NCS 10/20/2014, 12:34 PM [phone]

## 2014-10-20 NOTE — Progress Notes (Signed)
Subjective: 1 Day Post-Op Procedure(s) (LRB): INTRAMEDULLARY (IM) NAIL INTERTROCHANTRIC (Left)    Patient reports pain as moderate. Some nausea.  Hgb stable.   Objective:   VITALS:   Filed Vitals:   10/20/14 0810  BP: 93/47  Pulse: 104  Temp:   Resp:     Neurovascular intact Sensation intact distally Intact pulses distally Dorsiflexion/Plantar flexion intact Incision: no drainage and dry.    LABS  Recent Labs  10/20/14 0413  HGB 12.6  HCT 36.2  WBC 7.1  PLT 180     Recent Labs  10/20/14 0413  NA 139  K 4.0  BUN 17  CREATININE 0.93  GLUCOSE 110*    No results for input(s): LABPT, INR in the last 72 hours.   Assessment/Plan: 1 Day Post-Op Procedure(s) (LRB): INTRAMEDULLARY (IM) NAIL INTERTROCHANTRIC (Left)

## 2014-10-20 NOTE — Care Management Note (Signed)
Case Management Note  Patient Details  Name: Gail Hardy MRN: [id number] Date of Birth: [birth year]  Subjective/Objective:                    Action/Plan: Met with patient and her husband to discuss discharge planning; patient kept falling asleep. They are both determined that patient will return home. She has used a home health agency in the past but he doesn't remember name. He will call RNCM back with name of agency. She is new to [facility]. She has a rolling walker and a shower chair available at home. She also has a rollator. She uses [facility] for Rx. Husband called back stating that HHPT was provided by Jobe Gibbon with [facility] and that he would like to have her again.   List of home health providers left with patient. Referral sent to [facility] with [place]. RNCM will continue to follow.  Expected Discharge Date:  10/23/14               Expected Discharge Plan:     In-House Referral:     Discharge planning Services  CM Consult  Post Acute Care Choice:  Durable Medical Equipment, Home Health Choice offered to:  Patient, Spouse  DME Arranged:  N/A DME Agency:     HH Arranged:  PT HH Agency:     Status of Service:  In process, will continue to follow  Medicare Important Message Given:    Date Medicare IM Given:    Medicare IM give by:    Date Additional Medicare IM Given:    Additional Medicare Important Message give by:     If discussed at [place] of Stay Meetings, dates discussed:    Additional Comments:  Marshell Garfinkel, RN 10/20/2014, 11:09 AM

## 2014-10-20 NOTE — Progress Notes (Signed)
Pt. Woke up with pain in hip 10/10. Morphine 2mg  IV administered.

## 2014-10-21 ENCOUNTER — Inpatient Hospital Stay: Payer: PPO

## 2014-10-21 LAB — CBC
HEMATOCRIT: 35.3 % (ref 35.0–47.0)
HEMOGLOBIN: 12 g/dL (ref 12.0–16.0)
MCH: 30 pg (ref 26.0–34.0)
MCHC: 34 g/dL (ref 32.0–36.0)
MCV: 88.2 fL (ref 80.0–100.0)
Platelets: 174 10*3/uL (ref 150–440)
RBC: 4 MIL/uL (ref 3.80–5.20)
RDW: 14.5 % (ref 11.5–14.5)
WBC: 8.2 10*3/uL (ref 3.6–11.0)

## 2014-10-21 LAB — BASIC METABOLIC PANEL
Anion gap: 5 (ref 5–15)
BUN: 12 mg/dL (ref 6–20)
CHLORIDE: 106 mmol/L (ref 101–111)
CO2: 28 mmol/L (ref 22–32)
Calcium: 8.4 mg/dL — ABNORMAL LOW (ref 8.9–10.3)
Creatinine, Ser: 0.8 mg/dL (ref 0.44–1.00)
GFR calc Af Amer: >60 mL/min (ref >60)
GFR calc non Af Amer: >60 mL/min (ref >60)
GLUCOSE: 101 mg/dL — AB (ref 65–99)
POTASSIUM: 4 mmol/L (ref 3.5–5.1)
Sodium: 139 mmol/L (ref 135–145)

## 2014-10-21 LAB — SURGICAL PATHOLOGY

## 2014-10-21 LAB — URINE CULTURE: CULTURE: NO GROWTH

## 2014-10-21 MED ORDER — IPRATROPIUM-ALBUTEROL 0.5-2.5 (3) MG/3ML IN SOLN
3.0000 mL | Freq: Four times a day (QID) | RESPIRATORY_TRACT | Status: DC
  Administered 2014-10-21 – 2014-10-23 (×4): 3 mL via RESPIRATORY_TRACT
  Filled 2014-10-21 (×5): qty 3

## 2014-10-21 MED ORDER — BUDESONIDE-FORMOTEROL FUMARATE 160-4.5 MCG/ACT IN AERO
2.0000 | INHALATION_SPRAY | Freq: Two times a day (BID) | RESPIRATORY_TRACT | Status: DC
  Administered 2014-10-21 – 2014-10-23 (×4): 2 via RESPIRATORY_TRACT
  Filled 2014-10-21: qty 6

## 2014-10-21 MED ORDER — IPRATROPIUM-ALBUTEROL 0.5-2.5 (3) MG/3ML IN SOLN
3.0000 mL | RESPIRATORY_TRACT | Status: DC | PRN
  Administered 2014-10-21: 3 mL via RESPIRATORY_TRACT
  Filled 2014-10-21: qty 3

## 2014-10-21 NOTE — Progress Notes (Signed)
Subjective: 2 Days Post-Op Procedure(s) (LRB): INTRAMEDULLARY (IM) NAIL INTERTROCHANTRIC (Left)    Patient reports pain as moderate. Was short of breath this AM but better now. Medical consult obtained.  CXR normal.   No evidence for recurrent PE.  On Xarelto which should be theraputic for any clotting issues. Walked around nurses station today.    Objective:   VITALS:   Filed Vitals:   10/21/14 1256  BP:   Pulse: 98  Temp:   Resp:     ABD soft Neurovascular intact Sensation intact distally Intact pulses distally Dorsiflexion/Plantar flexion intact Incision: scant drainage  ROM hip better, less pain. Thigh soft  LABS  Recent Labs  10/20/14 0413 10/21/14 0444  HGB 12.6 12.0  HCT 36.2 35.3  WBC 7.1 8.2  PLT 180 174     Recent Labs  10/20/14 0413 10/21/14 0444  NA 139 139  K 4.0 4.0  BUN 17 12  CREATININE 0.93 0.80  GLUCOSE 110* 101*    No results for input(s): LABPT, INR in the last 72 hours.   Assessment/Plan: 2 Days Post-Op Procedure(s) (LRB): INTRAMEDULLARY (IM) NAIL INTERTROCHANTRIC (Left)   Up with therapy Discharge home with home health probably Sunday.

## 2014-10-21 NOTE — Evaluation (Signed)
Occupational Therapy Evaluation Patient Details Name: Gail Hardy MRN: [id number] DOB: 1945-12-20 Today's Date: 10/21/2014    History of Present Illness This patient is a 69 year old female who came to [facility] after a fall in June and is now status post L hip ORIF/nailing for stress fracture.     Clinical Impression   This patient is a 69 year old female who came to [facility] after a fall in June and is now status post L hip ORIF/nailing for stress fracture.  She lives with her husband and had been independent with basic activities of daily living before the fall in June. She now needs assist and would benefit from Occupational Therapy for ADL/functioal mobility training.    Follow Up Recommendations       Equipment Recommendations       Recommendations for Other Services       Precautions / Restrictions Precautions Precautions: Fall Restrictions Weight Bearing Restrictions: Yes LLE Weight Bearing: Weight bearing as tolerated      Mobility Bed Mobility                  Transfers                      Balance                                            ADL                                         General ADL Comments: Until fall had been independent with basic activities of daily living. Today practiced techniques for lower body dressing using hip kit. Patient sleepy and used hand over hand assist for most of this practice with Donned/doffed socks and pants to knees. Cues needed for technique.      Vision     Perception     Praxis      Pertinent Vitals/Pain Pain Score:  (Patient calmly reports 10)     Hand Dominance     Extremity/Trunk Assessment Upper Extremity Assessment Upper Extremity Assessment:  (Left shoulder flexion limited to 100o, Left upper extremity 3+/5, R full range of motion strength 4-/5.)   Lower Extremity Assessment Lower Extremity  Assessment: Defer to PT evaluation       Communication Communication Communication: No difficulties   Cognition Arousal/Alertness: Lethargic Behavior During Therapy: Flat affect Overall Cognitive Status: Within Functional Limits for tasks assessed                     General Comments       Exercises       Shoulder Instructions      Home Living Family/patient expects to be discharged to:: Private residence Living Arrangements: Spouse/significant other Available Help at Discharge: Family Type of Home: House Home Access: Stairs to enter Technical brewer of Steps: 3 Entrance Stairs-Rails: Right;Left (not able to reach both) Home Layout: One level     Bathroom Shower/Tub: Astronomer Accessibility: Yes   Home Equipment: Environmental consultant - 2 wheels;Walker - 4 wheels          Prior Functioning/Environment          Comments: Until  the fall was able to do basic ADL and mobility using rolling walker used a tub bench in walk in shower.    OT Diagnosis: Generalized weakness;Acute pain   OT Problem List: Decreased strength;Decreased activity tolerance;Impaired balance (sitting and/or standing);Pain   OT Treatment/Interventions: Self-care/ADL training    OT Goals(Current goals can be found in the care plan section) Acute Rehab OT Goals Patient Stated Goal: to get better OT Goal Formulation: With patient/family Time For Goal Achievement: 11/04/14 Potential to Achieve Goals: Good  OT Frequency: Min 1X/week   Barriers to D/C:            Co-evaluation              End of Session Equipment Utilized During Treatment:  (Hip kit)  Activity Tolerance: Patient limited by fatigue Patient left: in chair;with call bell/phone within reach;with chair alarm set;with family/visitor present   Time: 0746-0029 OT Time Calculation (min): 23 min Charges:  OT General Charges $OT Visit: 1 Procedure OT Evaluation $Initial OT Evaluation Tier I: 1  Procedure OT Treatments $Self Care/Home Management : 8-22 mins G-Codes:    Myrene Galas, MS/OTR/L  10/21/2014, 10:19 AM

## 2014-10-21 NOTE — Progress Notes (Signed)
Physical Therapy Treatment Patient Details Name: Gail Hardy MRN: [id number] DOB: 1945/08/22 Today's Date: 10/21/2014    History of Present Illness admitted for acute hospitalization status post L hip ORIF/nailing for stress fracture (sustained status post fall in June 2016).  Per telephone clarification per Dr. Sabra Heck, patient okay for WBAT to L LE; activity as tolerated (no bedrest restrictions).    PT Comments    Pt stated that she felt sick upon arrival this morning but displayed good motivation with performance of all activity. Pt able to perform SLR on L today, displaying increased strength of LLE. Pt still requires min assist for all transfers ( bed mobiltity, sit <> stand) but is able to display correct technique with VC. Pt able to increase ambulation distance today to around 80 ft with rest required at end due to fatigue. Displayed increased gait velocity and increased ability to perform gait with step through pattern.   Follow Up Recommendations        Equipment Recommendations       Recommendations for Other Services       Precautions / Restrictions Precautions Precautions: Fall Precaution Comments: s/p L hip nailing procedure Restrictions Weight Bearing Restrictions: No LLE Weight Bearing: Weight bearing as tolerated    Mobility  Bed Mobility Overal bed mobility: Needs Assistance Bed Mobility: Rolling Rolling: Min assist (takes significant amount of time to perform)   Supine to sit: Min assist        Transfers Overall transfer level: Needs assistance Equipment used: Rolling walker (2 wheeled) Transfers: Sit to/from Stand Sit to Stand: Min assist         General transfer comment:  (pt able to perform sti <> stand consistently with min assist and vc for correct technique for ease of transfer)  Ambulation/Gait Ambulation/Gait assistance: Min assist Ambulation Distance (Feet): 80 Feet Assistive device: Rolling walker (2 wheeled) Gait  Pattern/deviations: Step-through pattern;Decreased step length - right;Decreased step length - left;Decreased stride length;Trunk flexed   Gait velocity interpretation: <1.8 ft/sec, indicative of risk for recurrent falls General Gait Details:  (pt able to increase gait velocity today and displayed a more consistent ability to perform step through pattern. )   Stairs            Wheelchair Mobility    Modified Rankin (Stroke Patients Only)       Balance Overall balance assessment: Needs assistance Sitting-balance support: Feet supported (able to sit independently on EOB without assistance for balance) Sitting balance-Leahy Scale: Normal     Standing balance support: Bilateral upper extremity supported (standing using RW with BUE support) Standing balance-Leahy Scale: Fair                      Cognition Arousal/Alertness: Awake/alert (more awake than yesterday; pt did state that she felt sick ) Behavior During Therapy: WFL for tasks assessed/performed Overall Cognitive Status: Within Functional Limits for tasks assessed                      Exercises Other Exercises Other Exercises: bilat ankle pumps x 20, SLR x 10 on L , quad sets x 10 w/ 3 sec holds, glute sets x 10 w/ 3 sec holds    General Comments        Pertinent Vitals/Pain Pain Score: 10-Worst pain ever Pain Location:  (L hip) Pain Descriptors / Indicators: Burning    Home Living Family/patient expects to be discharged to:: Private residence Living Arrangements: Spouse/significant other  Available Help at Discharge: Family Type of Home: House Home Access: Stairs to enter Entrance Stairs-Rails: Right;Left (not able to reach both) Home Layout: One level Home Equipment: Walker - 2 wheels;Walker - 4 wheels      Prior Function        Comments: Until the fall was able to do basic ADL and mobility using rolling walker used a tub bench in walk in shower.   PT Goals (current goals can now be  found in the care plan section) Acute Rehab PT Goals Patient Stated Goal: to be able to walk without pain PT Goal Formulation: With patient Time For Goal Achievement: 11/03/14 Potential to Achieve Goals: Good Progress towards PT goals: Progressing toward goals    Frequency  BID    PT Plan Current plan remains appropriate    Co-evaluation             End of Session Equipment Utilized During Treatment: Gait belt Activity Tolerance: Patient tolerated treatment well;Patient limited by fatigue Patient left: in chair;with call bell/phone within reach;with chair alarm set;with SCD's reapplied     Time: 1552-0802 PT Time Calculation (min) (ACUTE ONLY): 30 min  Charges:                       G Codes:      Denna Haggard 10/25/2014, 10:42 AM

## 2014-10-21 NOTE — Consult Note (Addendum)
Gail Hardy NAME: Gail Hardy    MR#:  [id number]  DATE OF BIRTH:  [birth year]  DATE OF ADMISSION:  10/19/2014  PRIMARY CARE PHYSICIAN: Wilhemena Durie, MD   REQUESTING/REFERRING PHYSICIAN: Dr. Earnestine Leys  CHIEF COMPLAINT:  No chief complaint on file.   HISTORY OF PRESENT ILLNESS:  Gail Hardy  is a 69 y.o. female with a known history of hyperlipidemia, bipolar depression who presents to the hospital with persistent left hip pain after fall in June 2016. Bones scan revealed uptake along the inferior left femoral neck consistent with stress fracture. Patient underwent nailing of left hip. 7tofSeptember 2016 by Dr. Earnestine Leys. Post procedure she was noted to be somewhat hypoxic and was on 2 L of oxygen through nasal cannula. Today in the morning. She was noted to be wheezing and very short of breath. She admitted of having shortness of breath as well as wheezing for the past one week prior to coming to the hospital, but denied any sputum production. She was initiated on DuoNeb nebs and her condition improved and now she is down to 1 L of oxygen through nasal cannulas. She denies any fevers or chills. She admitted of smoking in the past for approximately 10 years, quit in 1990s.   PAST MEDICAL HISTORY:   Past Medical History  Diagnosis Date  . Aneurysm 2011  . Blood transfusion without reported diagnosis 2013,2014    x 2  . Glaucoma   . Hyperlipidemia   . Anemia   . Hernia   . Depression   . Pulmonary emboli 2011  . Cancer   . Family history of adverse reaction to anesthesia     daughter nausea and vomiting  . Stroke 2010    PAST SURGICAL HISTOIRY:   Past Surgical History  Procedure Laterality Date  . Abdominal hysterectomy    . Cholecystectomy    . Upper gi endoscopy  2013, 2014    [facility]  . Colonoscopy  2013    [facility]  . Cerebral aneurysm repair  2011    stent and coil insertion  . Hernia  repair  2014    lap paraesophageal repair  . Intramedullary (im) nail intertrochanteric Left 10/19/2014    Procedure: INTRAMEDULLARY (IM) NAIL INTERTROCHANTRIC;  Surgeon: Earnestine Leys, MD;  Location: [facility] ORS;  Service: Orthopedics;  Laterality: Left;    SOCIAL HISTORY:   Social History  Substance Use Topics  . Smoking status: Former Smoker -- 10 years    Types: Cigarettes    Quit date: 02/10/1989  . Smokeless tobacco: Never Used  . Alcohol Use: No    FAMILY HISTORY:   Family History  Problem Relation Age of Onset  . Emphysema Father   . COPD Mother   . Heart disease Mother   . Heart disease Brother     DRUG ALLERGIES:   Allergies  Allergen Reactions  . Diazepam     Other reaction(s): Feeling agitated (finding)    REVIEW OF SYSTEMS:  CONSTITUTIONAL: No fever, some weakness.  EYES: No blurred or double vision.  EARS, NOSE, AND THROAT: No tinnitus or ear pain.  RESPIRATORY: Admits of cough, shortness of breath, wheezing for the past one week prior to coming to the hospital, but no hemoptysis.  CARDIOVASCULAR: No chest pain, orthopnea, edema.  GASTROINTESTINAL: No nausea, vomiting, diarrhea or abdominal pain.  GENITOURINARY: No dysuria, hematuria.  ENDOCRINE: No polyuria, nocturia,  HEMATOLOGY: No anemia, easy bruising or bleeding  SKIN: No rash or lesion. MUSCULOSKELETAL: No joint pain or arthritis.   NEUROLOGIC: No tingling, numbness, weakness.  PSYCHIATRY: No anxiety or depression.   MEDICATIONS AT HOME:   Prior to Admission medications   Medication Sig Start Date End Date Taking? Authorizing Provider  diphenoxylate-atropine (LOMOTIL) 2.5-0.025 MG per tablet TAKE ONE TABLET BY MOUTH EVERY 6 HOURS AS NEEDED FOR DIARRHEA 09/20/14  Yes Jerrol Banana., MD  diphenoxylate-atropine (LOMOTIL) 2.5-0.025 MG per tablet Take 1 tablet by mouth as needed. 10/23/13  Yes Historical Provider, MD  ondansetron (ZOFRAN-ODT) 4 MG disintegrating tablet Take 1 tablet by mouth as  needed. 10/22/12  Yes Historical Provider, MD  ABILIFY 30 MG tablet Take 15 mg by mouth daily.  07/02/12   Historical Provider, MD  ALPRAZolam Duanne Moron) 0.5 MG tablet Take 1 tablet by mouth as needed. 04/17/09   Historical Provider, MD  buPROPion (WELLBUTRIN SR) 150 MG 12 hr tablet TAKE 1 TABLET BY MOUTH ONCE A DAY Patient taking differently: TAKE 1 TABLET BY MOUTH ONCE A DAY in am 09/04/14   Jerrol Banana., MD  citalopram (CELEXA) 20 MG tablet Take 40 mg by mouth every morning.  07/14/12   Historical Provider, MD  clonazePAM (KLONOPIN) 1 MG tablet Take 1 mg by mouth 2 (two) times daily. As needed for anxiety    Historical Provider, MD  Cyanocobalamin 1000 MCG CAPS Take 1 capsule by mouth daily. 06/15/12   Historical Provider, MD  ferrous sulfate 325 (65 FE) MG tablet Take 325 mg by mouth daily with breakfast.     Historical Provider, MD  HYDROcodone-acetaminophen (NORCO) 10-325 MG per tablet Take 1 tablet by mouth every 8 (eight) hours as needed. 10/13/14   Richard Maceo Pro., MD  latanoprost (XALATAN) 0.005 % ophthalmic solution Place 1 drop into both eyes at bedtime.  08/01/12   Historical Provider, MD  phentermine (ADIPEX-P) 37.5 MG tablet Take 1 tablet (37.5 mg total) by mouth daily. Patient not taking: Reported on 10/19/2014 09/20/14   Jerrol Banana., MD  simvastatin (ZOCOR) 20 MG tablet Take 1 tablet by mouth at bedtime. 02/18/13   Historical Provider, MD  temazepam (RESTORIL) 30 MG capsule TAKE 1 CAPSULE BY MOUTH ONCE DAILY Patient taking differently: as needed for sleep 09/04/14   Jerrol Banana., MD      VITAL SIGNS:  Blood pressure 106/50, pulse 97, temperature 98.6 F (37 C), temperature source Oral, resp. rate 17, height 5\' 1"  (1.549 m), weight 94.348 kg (208 lb), SpO2 91 %.  PHYSICAL EXAMINATION:  GENERAL:  69 y.o.-year-old patient lying in the bed with no acute distress. Pale and weak laying on the stretcher. She is somewhat somnolent EYES: Pupils equal, round, reactive to  light and accommodation. No scleral icterus. Extraocular muscles intact.  HEENT: Head atraumatic, normocephalic. Oropharynx and nasopharynx clear.  NECK:  Supple, no jugular venous distention. No thyroid enlargement, no tenderness.  LUNGS: Normal breath sounds bilaterally, slightly prolonged expiratory phase but otherwise no wheezing, rales,rhonchi or crepitation. No use of accessory muscles of respiration.  CARDIOVASCULAR: S1, S2 normal. No murmurs, rubs, or gallops.  ABDOMEN: Soft, nontender, nondistended. Bowel sounds present. No organomegaly or mass.  EXTREMITIES: No pedal edema, cyanosis, or clubbing.  NEUROLOGIC: Cranial nerves II through XII are intact. Muscle strength 5/5 in all extremities. Sensation intact. Gait not checked.  PSYCHIATRIC: The patient is alert and oriented x 3.  SKIN: No obvious rash, lesion, or ulcer.   LABORATORY PANEL:   CBC  Recent Labs Lab 10/21/14 0444  WBC 8.2  HGB 12.0  HCT 35.3  PLT 174   ------------------------------------------------------------------------------------------------------------------  Chemistries   Recent Labs Lab 10/21/14 0444  NA 139  K 4.0  CL 106  CO2 28  GLUCOSE 101*  BUN 12  CREATININE 0.80  CALCIUM 8.4*   ------------------------------------------------------------------------------------------------------------------  Cardiac Enzymes No results for input(s): TROPONINI in the last 168 hours. ------------------------------------------------------------------------------------------------------------------  RADIOLOGY:  Dg Chest 1 View  10/21/2014   CLINICAL DATA:  Weakness and shortness of breath.  Hypoxia.  EXAM: CHEST  1 VIEW  COMPARISON:  01/28/2011  FINDINGS: Heart size and pulmonary vascularity are normal. The lungs are clear. Surgical clips at the gastroesophageal junction, probably from hiatal hernia repair.  No osseous abnormality.  IMPRESSION: Normal chest.   Electronically Signed   By: Lorriane Shire  M.D.   On: 10/21/2014 09:38    EKG:   Orders placed or performed during the hospital encounter of 10/19/14  . EKG 12-Lead  . EKG 12-Lead  . EKG 12-Lead  . EKG 12-Lead    IMPRESSION AND PLAN:    Active Problems:   Stress fracture of hip with delayed healing 1. COPD exacerbation continue patient on duo nebs seem to be improving, add Symbicort 2. Hypoxemia wean patient to room air as tolerated 3. Pyuria, sterile ,  urine culture is negative  4. Hyperglycemia. Get hemoglobin A1c 5. Hip fracture status post pinning. Patient is to continue pain management as needed, although avoid the high doses of medications , as could be additional cause of patient's hypoxia     All the records are reviewed and case discussed with Consulting provider. Management plans discussed with the patient, family and they are in agreement.  CODE STATUS: Full code  TOTAL TIME TAKING CARE OF THIS PATIENT: 50 minutes.    Theodoro Grist M.D on 10/21/2014 at 6:15 PM  Between 7am to 6pm - Pager - [phone]  After 6pm go to [email or web address] - password [email or web address] [facility]  Office  [phone]  CC: Primary care Physician: Wilhemena Durie, MD

## 2014-10-21 NOTE — Progress Notes (Signed)
Physical Therapy Treatment Patient Details Name: KABRIA HETZER MRN: [id number] DOB: Mar 18, 1945 Today's Date: 10/21/2014    History of Present Illness admitted for acute hospitalization status post L hip ORIF/nailing for stress fracture (sustained status post fall in June 2016).  Per telephone clarification per Dr. Sabra Heck, patient okay for WBAT to L LE; activity as tolerated (no bedrest restrictions).    PT Comments    Able to complete gait distance around nursing station this date, cga with RW.  Fair cadence/gait speed without buckling/LOB.  Good tolerance for WBing L LE. Needs to complete stair training prior to discharge next date.   Follow Up Recommendations  Home health PT     Equipment Recommendations       Recommendations for Other Services       Precautions / Restrictions Precautions Precautions: Fall Restrictions Weight Bearing Restrictions: Yes LLE Weight Bearing: Weight bearing as tolerated    Mobility  Bed Mobility   Bed Mobility: Sit to Supine       Sit to supine: Supervision   General bed mobility comments: able to elevate LE over edge of bed without assist from therapist this date  Transfers Overall transfer level: Needs assistance Equipment used: Rolling walker (2 wheeled) Transfers: Sit to/from Stand Sit to Stand: Min guard         General transfer comment: cuing for hand placement  Ambulation/Gait Ambulation/Gait assistance: Min guard Ambulation Distance (Feet): 220 Feet Assistive device: Rolling walker (2 wheeled)     Gait velocity interpretation: <1.8 ft/sec, indicative of risk for recurrent falls (10' walk time, 8 seconds) General Gait Details: reciprocal stepping pattern with fair weight shift/weight acceptance to L LE.  Good cadence/gait speed. No overt buckling/LOB.   Stairs            Wheelchair Mobility    Modified Rankin (Stroke Patients Only)       Balance                                     Cognition Arousal/Alertness: Awake/alert Behavior During Therapy: WFL for tasks assessed/performed Overall Cognitive Status: Within Functional Limits for tasks assessed                      Exercises Other Exercises Other Exercises: Toilet transfers, SPT with RW, cga.  sit/stand from [facility] (elevated seat height) with RW, cga.  Standing balance for clothing management and hygiene, cga/close sup--maintains UE support on RW at all times for external stabilization Verbally reviewed technique for car transfers; patient/husband voiced understanding.   General Comments        Pertinent Vitals/Pain Pain Assessment: Faces Faces Pain Scale: Hurts even more Pain Descriptors / Indicators: Aching;Burning Pain Intervention(s): Limited activity within patient's tolerance;Monitored during session;Repositioned    Home Living                      Prior Function            PT Goals (current goals can now be found in the care plan section) Acute Rehab PT Goals Patient Stated Goal: to be able to walk without pain PT Goal Formulation: With patient Time For Goal Achievement: 11/03/14 Potential to Achieve Goals: Good Progress towards PT goals: Progressing toward goals    Frequency  BID    PT Plan Current plan remains appropriate    Co-evaluation  End of Session Equipment Utilized During Treatment: Gait belt Activity Tolerance: Patient tolerated treatment well;Patient limited by fatigue Patient left: in bed;with call bell/phone within reach;with bed alarm set     Time: 1443-1540 PT Time Calculation (min) (ACUTE ONLY): 13 min  Charges:  $Gait Training: 8-22 mins                    G Codes:      Kathia Covington H. Owens Shark, PT, DPT, NCS 10/21/2014, 5:15 PM [phone]

## 2014-10-21 NOTE — Progress Notes (Signed)
X-ray tech here to do the chest x-ray.

## 2014-10-21 NOTE — Progress Notes (Signed)
POD 2.VSS. Pain controlled with meds per MAR. Heels elevated off of bed. Tolerating PO's. Up to [facility]. Voiding clear yellow urine. Oxygen at 2lpm. Pt. desats when oxygen is reduced. Resting quietly with daughter at the bedside.

## 2014-10-21 NOTE — Progress Notes (Signed)
Spoke with Dr.Miller regarding patients complain of shortness of breath and wheezing, order received for medical consult.

## 2014-10-22 DIAGNOSIS — L8990 Pressure ulcer of unspecified site, unspecified stage: Secondary | ICD-10-CM | POA: Insufficient documentation

## 2014-10-22 LAB — CBC
HCT: 34 % — ABNORMAL LOW (ref 35.0–47.0)
Hemoglobin: 11.7 g/dL — ABNORMAL LOW (ref 12.0–16.0)
MCH: 30.4 pg (ref 26.0–34.0)
MCHC: 34.4 g/dL (ref 32.0–36.0)
MCV: 88.4 fL (ref 80.0–100.0)
PLATELETS: 179 10*3/uL (ref 150–440)
RBC: 3.84 MIL/uL (ref 3.80–5.20)
RDW: 14.1 % (ref 11.5–14.5)
WBC: 7.8 10*3/uL (ref 3.6–11.0)

## 2014-10-22 LAB — HEMOGLOBIN A1C: Hgb A1c MFr Bld: 5.4 % (ref 4.0–6.0)

## 2014-10-22 NOTE — Progress Notes (Signed)
Physical Therapy Treatment Patient Details Name: Gail Hardy MRN: [id number] DOB: 04-09-1945 Today's Date: 10/22/2014    History of Present Illness admitted for acute hospitalization status post L hip ORIF/nailing for stress fracture (sustained status post fall in June 2016).  Per telephone clarification per Dr. Sabra Heck, patient okay for WBAT to L LE; activity as tolerated (no bedrest restrictions).    PT Comments    Patient was instructed in increased BLE strengthening ROM. Patient required increased cues for increased ROM, particularly on LLE for better strengthening. She reports increased fatigue and was very slow with exercise. Patient demonstrates improved functional mobility being supervision for bed mobility and most transfers. She did require CGA for bedside commode transfers with min VCs for hand placement. Patient ambulated 175 feet with RW, CGA demonstrating reciprocal gait pattern. She requires cues to improve step length and foot clearance. Patient would benefit from additional skilled PT Intervention to improve LE strength, mobility and safety with ADLs. Patient was on room air during treatment session and SPO2 was 93% with gait tasks.  Follow Up Recommendations  Home health PT     Equipment Recommendations       Recommendations for Other Services       Precautions / Restrictions Precautions Precautions: Fall Precaution Comments: s/p L hip nailing procedure Restrictions Weight Bearing Restrictions: No LLE Weight Bearing: Weight bearing as tolerated    Mobility  Bed Mobility Overal bed mobility: Needs Assistance Bed Mobility: Supine to Sit;Sit to Supine Rolling: Supervision   Supine to sit: Supervision Sit to supine: Supervision   General bed mobility comments: Patient requires min VCs for hand placement; able to initiate rolling and scooting well in bed; able to scoot up in bed with B rails supervision;   Transfers Overall transfer level: Needs  assistance Equipment used: Rolling walker (2 wheeled) Transfers: Sit to/from Stand Sit to Stand: Min guard         General transfer comment: Transfers sit<>stand from bed, supervision, transfers sit<>stand from bedside commode CGA with cues for hand placement; patient able to advance LLE forward well during transfers;   Ambulation/Gait Ambulation/Gait assistance: Min guard Ambulation Distance (Feet): 175 Feet Assistive device: Rolling walker (2 wheeled) Gait Pattern/deviations: Step-through pattern;Decreased step length - right;Decreased step length - left;Narrow base of support;Decreased dorsiflexion - right;Decreased dorsiflexion - left Gait velocity: decreased   General Gait Details: Pt requires min VCs to increase erect posture, increase step length and increase DF at heel strike for better foot clearance;    Stairs            Wheelchair Mobility    Modified Rankin (Stroke Patients Only)       Balance                                    Cognition Arousal/Alertness: Lethargic Behavior During Therapy: WFL for tasks assessed/performed Overall Cognitive Status: Within Functional Limits for tasks assessed                      Exercises General Exercises - Lower Extremity Short Arc Quad: AROM;Strengthening;Both;15 reps;Supine Heel Slides: AROM;Strengthening;Both;15 reps;Supine Hip ABduction/ADduction: AROM;Strengthening;Both;15 reps;Supine Other Exercises Other Exercises: Pt required min Vcs for increased ROM to improve strength in BLE; Patient had difficulty initiating LLE movement, but was able to progress ROM with increased repetition. She fatigues after 15 reps;     General Comments  Pertinent Vitals/Pain      Home Living                      Prior Function            PT Goals (current goals can now be found in the care plan section) Acute Rehab PT Goals Patient Stated Goal: to be able to walk without pain PT  Goal Formulation: With patient Time For Goal Achievement: 11/03/14 Potential to Achieve Goals: Good Progress towards PT goals: Progressing toward goals    Frequency  BID    PT Plan Current plan remains appropriate    Co-evaluation             End of Session Equipment Utilized During Treatment: Gait belt Activity Tolerance: Patient tolerated treatment well;Patient limited by fatigue Patient left: with call bell/phone within reach;in bed;with bed alarm set;with SCD's reapplied     Time: 1400-1440 PT Time Calculation (min) (ACUTE ONLY): 40 min  Charges:  $Gait Training: 8-22 mins $Therapeutic Exercise: 8-22 mins $Therapeutic Activity: 8-22 mins                    G Codes:      Hopkins,Jenyfer Trawick PT, DPT 10/22/2014, 2:45 PM

## 2014-10-22 NOTE — Progress Notes (Signed)
Occupational Therapy Treatment Patient Details Name: SONITA MICHIELS MRN: [id number] DOB: 11-05-1945 Today's Date: 10/22/2014    History of present illness     OT comments  Pt making progress - pt plan to go home with [facility] - recommend before using walk in shower when bandages off - have therapy show her best and safest way   Follow Up Recommendations       Equipment Recommendations  Toilet riser    Recommendations for Other Services      Precautions / Restrictions         Mobility Bed Mobility                  Transfers                      Balance                                   ADL                                                Vision                     Perception     Praxis      Cognition   Behavior During Therapy: [facility] for tasks assessed/performed                         Extremity/Trunk Assessment               Exercises Other Exercises Other Exercises: ADL's pt was min A to mod A using AE for donning anddoffing of pants , socks - clothing negotiation and pulling up in standing C - functional mobilty to bathroom and negotiation of door - no LOB - pt CG - she in need for raised toilet seat - BSC goint to be to large per pt    Shoulder Instructions       General Comments      Pertinent Vitals/ Pain          Home Living                                          Prior Functioning/Environment              Frequency       Progress Toward Goals  OT Goals(current goals can now be found in the care plan section)  Progress towards OT goals: Progressing toward goals  Acute Rehab OT Goals Potential to Achieve Goals: Good  Plan Discharge plan remains appropriate    Co-evaluation                 End of Session Equipment Utilized During Treatment: Gait belt;Rolling walker   Activity Tolerance Patient tolerated treatment well   Patient Left  in chair;with call bell/phone within reach;with chair alarm set   Nurse Communication          Time: [phone] OT Time Calculation (min): 34 min  Charges: OT General Charges $OT Visit: 1 Procedure OT Treatments $Self Care/Home Management : 23-37 mins  Camar Guyton OTR/L,CLT 10/22/2014, 1:27  PM

## 2014-10-22 NOTE — Progress Notes (Signed)
Patient weaned off the oxygen this afternoon per MD order, no complain of shortness of breath. Will continue to monitor the patient. Oxygen on room air 93-100%.

## 2014-10-22 NOTE — Progress Notes (Signed)
POD 3 from left hip nailing. VSS. O2 @1L . Pain controlled with PO pain meds. Pt. Had BM yesterday. Up to [facility] [place]. Scheduled to go home tomorrow. Aquaseal dressing clean dry and intact. Will continue to monitor

## 2014-10-22 NOTE — Progress Notes (Signed)
[facility] [place] NAME: Gail Hardy    MR#:  [id number]  DATE OF BIRTH:  January 01, 1946  SUBJECTIVE:  CHIEF COMPLAINT:  No chief complaint on file.   -doesn't feel good this morning. Feels tired and fatigued. -Breathing is better today. Remains on 2 L oxygen.  REVIEW OF SYSTEMS:  Review of Systems  Constitutional: Negative for fever and chills.  Respiratory: Negative for cough, shortness of breath and wheezing.   Cardiovascular: Negative for chest pain and palpitations.  Gastrointestinal: Negative for nausea, vomiting, abdominal pain, diarrhea and constipation.  Genitourinary: Negative for dysuria.  Musculoskeletal: Positive for joint pain.  Neurological: Positive for weakness. Negative for dizziness, seizures and headaches.    DRUG ALLERGIES:   Allergies  Allergen Reactions  . Diazepam     Other reaction(s): Feeling agitated (finding)    VITALS:  Blood pressure 118/64, pulse 103, temperature 98.7 F (37.1 C), temperature source Oral, resp. rate 18, height 5\' 1"  (1.549 m), weight 94.348 kg (208 lb), SpO2 96 %.  PHYSICAL EXAMINATION:  Physical Exam  GENERAL:  69 y.o.-year-old patient lying in the bed with no acute distress.  EYES: Pupils equal, round, reactive to light and accommodation. No scleral icterus. Extraocular muscles intact.  HEENT: Head atraumatic, normocephalic. Oropharynx and nasopharynx clear.  NECK:  Supple, no jugular venous distention. No thyroid enlargement, no tenderness.  LUNGS: Normal breath sounds bilaterally, no wheezing, rales,rhonchi or crepitation. No use of accessory muscles of respiration. Decreased bibasilar breath sounds. CARDIOVASCULAR: S1, S2 normal. No rubs, or gallops. 3/6 systolic murmur present  ABDOMEN: Soft, nontender, nondistended. Bowel sounds present. No organomegaly or mass.  EXTREMITIES: No pedal edema, cyanosis, or clubbing. Tenderness of the left hip and dressing in place  noted NEUROLOGIC: Cranial nerves II through XII are intact. Muscle strength 5/5 in all extremities. Sensation intact. Gait not checked. Left leg movement limited due to pain PSYCHIATRIC: The patient is alert and oriented x 3.  SKIN: No obvious rash, lesion, or ulcer.    LABORATORY PANEL:   CBC  Recent Labs Lab 10/22/14 0328  WBC 7.8  HGB 11.7*  HCT 34.0*  PLT 179   ------------------------------------------------------------------------------------------------------------------  Chemistries   Recent Labs Lab 10/21/14 0444  NA 139  K 4.0  CL 106  CO2 28  GLUCOSE 101*  BUN 12  CREATININE 0.80  CALCIUM 8.4*   ------------------------------------------------------------------------------------------------------------------  Cardiac Enzymes No results for input(s): TROPONINI in the last 168 hours. ------------------------------------------------------------------------------------------------------------------  RADIOLOGY:  Dg Chest 1 View  10/21/2014   CLINICAL DATA:  Weakness and shortness of breath.  Hypoxia.  EXAM: CHEST  1 VIEW  COMPARISON:  01/28/2011  FINDINGS: Heart size and pulmonary vascularity are normal. The lungs are clear. Surgical clips at the gastroesophageal junction, probably from hiatal hernia repair.  No osseous abnormality.  IMPRESSION: Normal chest.   Electronically Signed   By: Lorriane Shire M.D.   On: 10/21/2014 09:38    EKG:   Orders placed or performed during the hospital encounter of 10/19/14  . EKG 12-Lead  . EKG 12-Lead  . EKG 12-Lead  . EKG 12-Lead    ASSESSMENT AND PLAN:   69 year old female with past medical history significant for anemia, depression, history of stroke admitted for fall and left femoral neck fracture. She is postop day 3 today. Medicine consulted for wheezing and hypoxia.  #1. Hypoxia with expiratory wheezing-reactive airway disease. Patient denies any history of COPD. But was a former smoker. Not on  any home oxygen  though. -Breathing is much improved today. Continue duo nebs as needed. -Not on any systemic steroids. Encouraged to do incentive spirometry. -Wean off the oxygen today. -Chest x-ray with no significant findings.  #2 left femoral neck fracture-management per orthopedics -Continue pain management and physical therapy consult  -on Xarelto for DVT prophylaxis -Postoperative day 3 today -Possible discharge home with home health tomorrow  #3 depression and anxiety-continue home medications. -Patient on Celexa, Wellbutrin, Abilify, Xanax and Klonopin  #4 hyperlipidemia-on statin.  #5 DVT prophylaxis-on Xarelto  All the records are reviewed and case discussed with Care Management/Social Workerr. Management plans discussed with the patient, family and they are in agreement.  CODE STATUS: Full code  TOTAL TIME TAKING CARE OF THIS PATIENT: 35 minutes.   POSSIBLE D/C IN 1 DAYS, DEPENDING ON CLINICAL CONDITION.   Gladstone Lighter M.D on 10/22/2014 at 8:36 AM  Between 7am to 6pm - Pager - [phone]  After 6pm go to [email or web address] - password [email or web address] [facility]  Office  [phone]  CC: Primary care physician; Wilhemena Durie, MD

## 2014-10-22 NOTE — Progress Notes (Signed)
  Subjective:  Postop day #3 status post intramedullary fixation left hip.  Patient reports pain as moderate.  Patient is seen lying in a hospital bed. Her husband is at the bedside. Patient complains of moderate hip pain. Patient had a bowel movement.  Objective:   VITALS:   Filed Vitals:   10/22/14 0508 10/22/14 0807 10/22/14 0809 10/22/14 0850  BP: 112/55 112/28 118/64 112/68  Pulse: 97 103 103   Temp: 98.3 F (36.8 C) 98.7 F (37.1 C)    TempSrc: Oral Oral    Resp: 18 18    Height:      Weight:      SpO2: 93% 96%      PHYSICAL EXAM:  Left hip/thigh:   Patient's dressing was changed today with the nurse. Patient had an Aquasol dressing which was changed to a honeycomb dressing. There was drainage on the Aquasol dressing. Patient has edema and ecchymosis in the left thigh.  She had TED stockings and foot pumps place. Leg and thigh compartments are soft and compressible. She has intact sensation to light touch in palpable pedal pulses. She had intact motor function throughout the left lower extremity.   LABS  Results for orders placed or performed during the hospital encounter of 10/19/14 (from the past 24 hour(s))  CBC     Status: Abnormal   Collection Time: 10/22/14  3:28 AM  Result Value Ref Range   WBC 7.8 3.6 - 11.0 K/uL   RBC 3.84 3.80 - 5.20 MIL/uL   Hemoglobin 11.7 (L) 12.0 - 16.0 g/dL   HCT 34.0 (L) 35.0 - 47.0 %   MCV 88.4 80.0 - 100.0 fL   MCH 30.4 26.0 - 34.0 pg   MCHC 34.4 32.0 - 36.0 g/dL   RDW 14.1 11.5 - 14.5 %   Platelets 179 150 - 440 K/uL    Dg Chest 1 View  10/21/2014   CLINICAL DATA:  Weakness and shortness of breath.  Hypoxia.  EXAM: CHEST  1 VIEW  COMPARISON:  01/28/2011  FINDINGS: Heart size and pulmonary vascularity are normal. The lungs are clear. Surgical clips at the gastroesophageal junction, probably from hiatal hernia repair.  No osseous abnormality.  IMPRESSION: Normal chest.   Electronically Signed   By: Lorriane Shire M.D.   On:  10/21/2014 09:38    Assessment/Plan: 3 Days Post-Op   Active Problems:   Stress fracture of hip with delayed healing   Pressure ulcer  Patient is making progress postop. She will continue with physical occupational therapy. She was encouraged to get out of bed today. She was also instructed to continue using her incentive spirometry every hour while awake. Patient may be ready for discharge tomorrow pending PT evaluation and recommendation.Marland Kitchen    Thornton Park , MD 10/22/2014, 11:51 AM

## 2014-10-22 NOTE — Progress Notes (Signed)
Physical Therapy Treatment Patient Details Name: Gail Hardy MRN: [id number] DOB: 06-10-1945 Today's Date: 10/22/2014    History of Present Illness admitted for acute hospitalization status post L hip ORIF/nailing for stress fracture (sustained status post fall in June 2016).  Per telephone clarification per Dr. Sabra Heck, patient okay for WBAT to L LE; activity as tolerated (no bedrest restrictions).    PT Comments    Patient very lethargic. BP lower today at 112/68. Patient also complained of increased LLE hip pain. RN gave pain meds during PT session. Patient able to negotiate stairs today with min guarding. She required min Vcs for correct gait techniques and stair negotiation. Patient still limited with LLE weakness and ROM. She would benefit from additional skilled PT intervention to improve gait safety and return to PLOF.  Follow Up Recommendations  Home health PT     Equipment Recommendations       Recommendations for Other Services       Precautions / Restrictions Precautions Precautions: Fall Precaution Comments: s/p L hip nailing procedure Restrictions Weight Bearing Restrictions: No LLE Weight Bearing: Weight bearing as tolerated    Mobility  Bed Mobility Overal bed mobility: Needs Assistance Bed Mobility: Supine to Sit Rolling: Supervision   Supine to sit: Supervision     General bed mobility comments: Patient required min VCs for hand placement and to scoot forward to edge of bed. Patient able to initiate and move LLE independently with cues.  Transfers Overall transfer level: Needs assistance Equipment used: Rolling walker (2 wheeled) Transfers: Sit to/from Stand Sit to Stand: Min guard         General transfer comment: Patient required min Vcs for hand placement. Also requires min Vcs for advancing LLE forward for less hip discomfort. Patient transferred sit<>Stand x2 reps;  Ambulation/Gait Ambulation/Gait assistance: Min guard Ambulation  Distance (Feet): 100 Feet Assistive device: Rolling walker (2 wheeled) Gait Pattern/deviations: Step-through pattern;Decreased step length - right;Decreased stance time - left;Decreased weight shift to left Gait velocity: decreased   General Gait Details: Patient ambulates at slower gait speed. Step through pattern. Demnstrates forward flexed posture requiring min VCs to improve posture and increase step length.    Stairs Stairs: Yes Stairs assistance: Min guard Stair Management: Two rails Number of Stairs: 4 General stair comments: ascend/descend 4 steps, one step at a time; required min VCs for correct technique to protect LLE.  Wheelchair Mobility    Modified Rankin (Stroke Patients Only)       Balance                                    Cognition Arousal/Alertness: Lethargic Behavior During Therapy: WFL for tasks assessed/performed Overall Cognitive Status: Within Functional Limits for tasks assessed                      Exercises Other Exercises Other Exercises: Patient required min VCs for increased ROM to increase flexibility and reduce stiffness. Patient initially required min A for AAROM on LLE but then able to progress to AROM after 2-3 reps;    General Comments        Pertinent Vitals/Pain Pain Assessment: 0-10 Pain Score: 10-Worst pain ever Pain Location: left hip Pain Descriptors / Indicators: Burning Pain Intervention(s): Limited activity within patient's tolerance;Monitored during session;Repositioned (RN Notified)    Home Living  Prior Function            PT Goals (current goals can now be found in the care plan section) Acute Rehab PT Goals Patient Stated Goal: to be able to walk without pain PT Goal Formulation: With patient Time For Goal Achievement: 11/03/14 Potential to Achieve Goals: Good Progress towards PT goals: Progressing toward goals    Frequency  BID    PT Plan Current plan  remains appropriate    Co-evaluation             End of Session Equipment Utilized During Treatment: Gait belt Activity Tolerance: Patient tolerated treatment well;Patient limited by fatigue Patient left: in chair;with call bell/phone within reach;with chair alarm set     Time: 0830-0902 PT Time Calculation (min) (ACUTE ONLY): 32 min  Charges:  $Gait Training: 8-22 mins $Therapeutic Exercise: 8-22 mins                    G Codes:     Alessandra Grout, PT, DPT, [phone] 10/22/14  9:18 AM   Hopkins,Margaret 10/22/2014, 9:16 AM

## 2014-10-23 MED ORDER — IPRATROPIUM-ALBUTEROL 0.5-2.5 (3) MG/3ML IN SOLN: 3.0000 mL | Freq: Four times a day (QID) | RESPIRATORY_TRACT | Status: DC | PRN

## 2014-10-23 MED ORDER — BUDESONIDE-FORMOTEROL FUMARATE 160-4.5 MCG/ACT IN AERO: 2.0000 | INHALATION_SPRAY | Freq: Two times a day (BID) | RESPIRATORY_TRACT | Status: DC

## 2014-10-23 MED ORDER — RIVAROXABAN 10 MG PO TABS: 10.0000 mg | ORAL_TABLET | Freq: Every day | ORAL | Status: DC

## 2014-10-23 MED ORDER — HYDROCODONE-ACETAMINOPHEN 5-325 MG PO TABS: 1.0000 | ORAL_TABLET | Freq: Four times a day (QID) | ORAL | Status: DC | PRN

## 2014-10-23 MED ORDER — HYDROCODONE-ACETAMINOPHEN 5-325 MG PO TABS: 1.0000 | ORAL_TABLET | ORAL | Status: DC | PRN

## 2014-10-23 NOTE — Progress Notes (Addendum)
[facility] [place] NAME: Gail Hardy    MR#:  [id number]  DATE OF BIRTH:  1945-05-10  SUBJECTIVE:  CHIEF COMPLAINT:   Chief Complaint  Patient presents with  . OT Treatment    -Feels great this morning. Slept better last night - off oxygen, vitals and labs stable. Had bowel movement yesterday - possible discharge today with home health  REVIEW OF SYSTEMS:  Review of Systems  Constitutional: Negative for fever and chills.  Respiratory: Negative for cough, shortness of breath and wheezing.   Cardiovascular: Negative for chest pain and palpitations.  Gastrointestinal: Negative for nausea, vomiting, abdominal pain, diarrhea and constipation.  Genitourinary: Negative for dysuria.  Musculoskeletal: Positive for joint pain.  Neurological: Negative for dizziness, seizures, weakness and headaches.    DRUG ALLERGIES:   Allergies  Allergen Reactions  . Diazepam     Other reaction(s): Feeling agitated (finding)    VITALS:  Blood pressure 102/45, pulse 90, temperature 98.5 F (36.9 C), temperature source Oral, resp. rate 18, height 5\' 1"  (1.549 m), weight 94.348 kg (208 lb), SpO2 93 %.  PHYSICAL EXAMINATION:  Physical Exam  GENERAL:  69 y.o.-year-old patient lying in the bed with no acute distress.  EYES: Pupils equal, round, reactive to light and accommodation. No scleral icterus. Extraocular muscles intact.  HEENT: Head atraumatic, normocephalic. Oropharynx and nasopharynx clear.  NECK:  Supple, no jugular venous distention. No thyroid enlargement, no tenderness.  LUNGS: Normal breath sounds bilaterally, no wheezing, rales,rhonchi or crepitation. No use of accessory muscles of respiration. Decreased bibasilar breath sounds. CARDIOVASCULAR: S1, S2 normal. No rubs, or gallops. 3/6 systolic murmur present  ABDOMEN: Soft, nontender, nondistended. Bowel sounds present. No organomegaly or mass.  EXTREMITIES: No pedal edema,  cyanosis, or clubbing. Tenderness of the left hip and dressing in place noted NEUROLOGIC: Cranial nerves II through XII are intact. Muscle strength 5/5 in all extremities. Sensation intact. Gait not checked. Left leg movement limited due to pain PSYCHIATRIC: The patient is alert and oriented x 3.  SKIN: No obvious rash, lesion, or ulcer.    LABORATORY PANEL:   CBC  Recent Labs Lab 10/22/14 0328  WBC 7.8  HGB 11.7*  HCT 34.0*  PLT 179   ------------------------------------------------------------------------------------------------------------------  Chemistries   Recent Labs Lab 10/21/14 0444  NA 139  K 4.0  CL 106  CO2 28  GLUCOSE 101*  BUN 12  CREATININE 0.80  CALCIUM 8.4*   ------------------------------------------------------------------------------------------------------------------  Cardiac Enzymes No results for input(s): TROPONINI in the last 168 hours. ------------------------------------------------------------------------------------------------------------------  RADIOLOGY:  Dg Chest 1 View  10/21/2014   CLINICAL DATA:  Weakness and shortness of breath.  Hypoxia.  EXAM: CHEST  1 VIEW  COMPARISON:  01/28/2011  FINDINGS: Heart size and pulmonary vascularity are normal. The lungs are clear. Surgical clips at the gastroesophageal junction, probably from hiatal hernia repair.  No osseous abnormality.  IMPRESSION: Normal chest.   Electronically Signed   By: Lorriane Shire M.D.   On: 10/21/2014 09:38    EKG:   Orders placed or performed during the hospital encounter of 10/19/14  . EKG 12-Lead  . EKG 12-Lead  . EKG 12-Lead  . EKG 12-Lead    ASSESSMENT AND PLAN:   69 year old female with past medical history significant for anemia, depression, history of stroke admitted for fall and left femoral neck fracture. She is postop day 3 today. Medicine consulted for wheezing and hypoxia.  #1. Hypoxia with expiratory wheezing-reactive airway disease.  Patient  denies any history of COPD. But was a former smoker. Not on any home oxygen though. -Breathing is much improved. Continue duo nebs as needed. -Not on any systemic steroids. Encouraged to do incentive spirometry. -Weaned off oxygen -Chest x-ray with no significant findings. - can be discharged with an albuterol inhaler prn for the next week.  #2 left femoral neck fracture-management per orthopedics -Continue pain management and physical therapy consult  -on Xarelto for DVT prophylaxis -Postoperative day 4 today -Possible discharge home with home health today  #3 depression and anxiety-continue home medications. -Patient on Celexa, Wellbutrin, Abilify, Xanax and Klonopin  #4 hyperlipidemia-on statin.  #5 DVT prophylaxis-on Xarelto  Medically stable for discharge. Possible discharge today per ortho notes. Will sign off. Please call if any questions. Thank you.  All the records are reviewed and case discussed with Care Management/Social Workerr. Management plans discussed with the patient, family and they are in agreement.  CODE STATUS: Full code  TOTAL TIME TAKING CARE OF THIS PATIENT: 37 minutes.   POSSIBLE D/C TODAY, DEPENDING ON CLINICAL CONDITION.   Gladstone Lighter M.D on 10/23/2014 at 8:12 AM  Between 7am to 6pm - Pager - [phone]  After 6pm go to [email or web address] - password [email or web address] [facility]  Office  [phone]  CC: Primary care physician; Wilhemena Durie, MD

## 2014-10-23 NOTE — Care Management Note (Signed)
Case Management Note  Patient Details  Name: Gail Hardy MRN: [id number] Date of Birth: Jun 10, 1945  Subjective/Objective:     Referral for home health RN and PT faxed and called to [place].                Action/Plan:   Expected Discharge Date:  10/23/14               Expected Discharge Plan:     In-House Referral:     Discharge planning Services  CM Consult  Post Acute Care Choice:  Durable Medical Equipment, Home Health Choice offered to:  Patient, Spouse  DME Arranged:  N/A DME Agency:     HH Arranged:  PT HH Agency:     Status of Service:  In process, will continue to follow  Medicare Important Message Given:    Date Medicare IM Given:    Medicare IM give by:    Date Additional Medicare IM Given:    Additional Medicare Important Message give by:     If discussed at [place] of Stay Meetings, dates discussed:    Additional Comments:  [name] A, RN 10/23/2014, 1:28 PM

## 2014-10-23 NOTE — Progress Notes (Signed)
  Subjective:  Patient improving postop. Patient reports pain as mild.  Patient's husband is at the bedside. They state they're ready to go home. I discussed the case with Bhavini, her nurse, who agrees that she is doing well.    Objective:   VITALS:   Filed Vitals:   10/22/14 2036 10/23/14 0509 10/23/14 0722 10/23/14 0902  BP:  100/70 102/45   Pulse:  86 90   Temp:  97.8 F (36.6 C) 98.5 F (36.9 C)   TempSrc:  Oral Oral   Resp:  18 18   Height:      Weight:      SpO2: 92% 92% 93% 93%    PHYSICAL EXAM:  Patient had her dressing changed daily by the RN. There is no active drainage currently. She has ecchymosis over the left hip but her thigh compartments are, soft and compressible. She has intact sensation to light touch, palpable pedal pulses and 5 out of 5 strength in the left lower leg muscle groups. She has no calf tenderness or lower leg edema.   LABS  No results found for this or any previous visit (from the past 24 hour(s)).  No results found.  Assessment/Plan: 4 Days Post-Op   Active Problems:   Stress fracture of hip with delayed healing   Pressure ulcer  Patient has progressed appropriately postop. She will be discharged home today. She will remain on Xarelto for DVT prophylaxis given her history of blood clots. Patient will have home health PT. They should check and change her dressing as needed. Patient will follow up with Dr. Earnestine Leys in clinic and 10-14 days.   Thornton Park , MD 10/23/2014, 12:57 PM

## 2014-10-23 NOTE — Discharge Summary (Signed)
Physician Discharge Summary  Patient ID: Gail Hardy MRN: [id number] DOB/AGE: 1945-07-18 69 y.o.  Admit date: 10/19/2014 Discharge date: 10/23/2014  Admission Diagnoses:  STRESS FRACTURE OF LEFT NECK FEMUR  Discharge Diagnoses:  STRESS FRACTURE OF LEFT NECK FEMUR S/P INTRAMEDULLARY FIXATION  Active Problems:   Stress fracture of hip with delayed healing   Pressure ulcer   Past Medical History  Diagnosis Date  . Aneurysm 2011  . Blood transfusion without reported diagnosis 2013,2014    x 2  . Glaucoma   . Hyperlipidemia   . Anemia   . Hernia   . Depression   . Pulmonary emboli 2011  . Cancer   . Family history of adverse reaction to anesthesia     daughter nausea and vomiting  . Stroke 2010    Surgeries: Procedure(s): INTRAMEDULLARY (IM) NAIL INTERTROCHANTRIC on 10/19/2014   Consultants (if any): Treatment Team:  Epifanio Lesches, MD Gladstone Lighter, MD  Discharged Condition: Improved  Hospital Course: Gail Hardy is an 69 y.o. female who was admitted 10/19/2014 with a diagnosis of  STRESS FRACTURE OF LEFT NECK FEMUR  and went to the operating room on 10/19/2014 and underwent intramedullary fixation of the left hip stress fracture.    She was given perioperative antibiotics:  Anti-infectives    Start     Dose/Rate Route Frequency Ordered Stop   10/19/14 1400  clindamycin (CLEOCIN) IVPB 600 mg     600 mg 100 mL/hr over 30 Minutes Intravenous 3 times per day 10/19/14 1201 10/20/14 0624   10/19/14 1215  ceFAZolin (ANCEF) IVPB 2 g/50 mL premix     2 g 100 mL/hr over 30 Minutes Intravenous Every 6 hours 10/19/14 1201 10/19/14 1758   10/19/14 0645  clindamycin (CLEOCIN) IVPB 900 mg  Status:  Discontinued     900 mg 100 mL/hr over 30 Minutes Intravenous  Once 10/19/14 0635 10/19/14 1112   10/19/14 0645  ceFAZolin (ANCEF) IVPB 2 g/50 mL premix  Status:  Discontinued     2 g 100 mL/hr over 30 Minutes Intravenous  Once 10/19/14 0635 10/19/14 1112   10/19/14 0559   ceFAZolin (ANCEF) 2-3 GM-% IVPB SOLR    Comments:  JOYCE, HEATHER: cabinet override      10/19/14 0559 10/19/14 1759   10/19/14 0559  clindamycin (CLEOCIN) 900 MG/50ML IVPB    Comments:  JOYCE, HEATHER: cabinet override      10/19/14 0559 10/19/14 1759    .  She was given sequential compression devices, TED stockings and Xarelto for DVT prophylaxis.  She benefited maximally from the hospital stay and there were no complications.    Recent vital signs:  Filed Vitals:   10/23/14 0722  BP: 102/45  Pulse: 90  Temp: 98.5 F (36.9 C)  Resp: 18    Recent laboratory studies:  Lab Results  Component Value Date   HGB 11.7* 10/22/2014   HGB 12.0 10/21/2014   HGB 12.6 10/20/2014   Lab Results  Component Value Date   WBC 7.8 10/22/2014   PLT 179 10/22/2014   Lab Results  Component Value Date   INR 0.96 10/13/2014   Lab Results  Component Value Date   NA 139 10/21/2014   K 4.0 10/21/2014   CL 106 10/21/2014   CO2 28 10/21/2014   BUN 12 10/21/2014   CREATININE 0.80 10/21/2014   GLUCOSE 101* 10/21/2014    Discharge Medications:     Medication List    TAKE these medications  budesonide-formoterol 160-4.5 MCG/ACT inhaler  Commonly known as:  SYMBICORT  Inhale 2 puffs into the lungs 2 (two) times daily.      ASK your doctor about these medications        ABILIFY 30 MG tablet  Generic drug:  ARIPiprazole  Take 15 mg by mouth daily.     ALPRAZolam 0.5 MG tablet  Commonly known as:  XANAX  Take 1 tablet by mouth as needed.     buPROPion 150 MG 12 hr tablet  Commonly known as:  WELLBUTRIN SR  TAKE 1 TABLET BY MOUTH ONCE A DAY     citalopram 20 MG tablet  Commonly known as:  CELEXA  Take 40 mg by mouth every morning.     clonazePAM 1 MG tablet  Commonly known as:  KLONOPIN  Take 1 mg by mouth 2 (two) times daily. As needed for anxiety     Cyanocobalamin 1000 MCG Caps  Take 1 capsule by mouth daily.     LOMOTIL 2.5-0.025 MG per tablet  Generic  drug:  diphenoxylate-atropine  Take 1 tablet by mouth as needed.     diphenoxylate-atropine 2.5-0.025 MG per tablet  Commonly known as:  LOMOTIL  TAKE ONE TABLET BY MOUTH EVERY 6 HOURS AS NEEDED FOR DIARRHEA     ferrous sulfate 325 (65 FE) MG tablet  Take 325 mg by mouth daily with breakfast.     HYDROcodone-acetaminophen 10-325 MG per tablet  Commonly known as:  NORCO  Take 1 tablet by mouth every 8 (eight) hours as needed.     latanoprost 0.005 % ophthalmic solution  Commonly known as:  XALATAN  Place 1 drop into both eyes at bedtime.     ondansetron 4 MG disintegrating tablet  Commonly known as:  ZOFRAN-ODT  Take 1 tablet by mouth as needed.     phentermine 37.5 MG tablet  Commonly known as:  ADIPEX-P  Take 1 tablet (37.5 mg total) by mouth daily.     simvastatin 20 MG tablet  Commonly known as:  ZOCOR  Take 1 tablet by mouth at bedtime.     temazepam 30 MG capsule  Commonly known as:  RESTORIL  TAKE 1 CAPSULE BY MOUTH ONCE DAILY        Diagnostic Studies: Dg Chest 1 View  10/21/2014   CLINICAL DATA:  Weakness and shortness of breath.  Hypoxia.  EXAM: CHEST  1 VIEW  COMPARISON:  01/28/2011  FINDINGS: Heart size and pulmonary vascularity are normal. The lungs are clear. Surgical clips at the gastroesophageal junction, probably from hiatal hernia repair.  No osseous abnormality.  IMPRESSION: Normal chest.   Electronically Signed   By: Lorriane Shire M.D.   On: 10/21/2014 09:38   Nm Bone Scan Whole Body  10/06/2014   CLINICAL DATA:  Left thigh pain, worse with weight-bearing. Fall 2 months ago, started having pain status post fall.  EXAM: NUCLEAR MEDICINE WHOLE BODY BONE SCAN  TECHNIQUE: Whole body anterior and posterior images were obtained approximately 3 hours after intravenous injection of radiopharmaceutical.  RADIOPHARMACEUTICALS:  24.1 mCi Technetium-35m MDP IV  COMPARISON:  CT left hip dated 08/29/2014  FINDINGS: Focal radiotracer uptake along the medial aspect of the  distal left femoral neck. No corresponding abnormality on prior CT left hip. This suggests an occult/incomplete left femoral neck fracture.  Mild radiotracer uptake along in anterior right lower rib, possibly the 9th rib. This may also be posttraumatic.  Mild degenerative changes at the left 1st MTP joint, likely  degenerative.  No abnormal accumulation of radiotracer within the axillary or appendicular skeleton to suggest skeletal metastases.  IMPRESSION: Focal radiotracer uptake along the medial aspect of the distal left femoral neck, suggesting an occult/incomplete left femoral neck fracture. Consider left hip MRI for further evaluation.  Mild radiotracer uptake along the anterior right lower 9th rib, possibly reflecting an additional posttraumatic injury.   Electronically Signed   By: Julian Hy M.D.   On: 10/06/2014 12:29   Dg Hip Operative Unilat With Pelvis Left  10/19/2014   CLINICAL DATA:  Left hip surgery.  EXAM: OPERATIVE LEFT HIP (WITH PELVIS IF PERFORMED) 5 VIEWS  TECHNIQUE: Fluoroscopic spot image(s) were submitted for interpretation post-operatively.  COMPARISON:  CT 08/29/2014 .  FINDINGS: Open reduction internal fixation left hip with good anatomic alignment. Hardware intact. Fluoroscopic time 38 seconds. Five images.  IMPRESSION: Head reduction internal fixation left hip with good anatomic alignment.   Electronically Signed   By: Marcello Moores  Register   On: 10/19/2014 09:41    Disposition:   Patient will be discharged home today with home health PT. They will change her dressing as needed   She will remain partial weightbearing on the left lower extremity until follow-up with Dr. Sabra Heck. She will remain on Xarelto for DVT prophylaxis. She will follow up with Dr. Earnestine Leys in 10-14 days.      Follow-up Information    Follow up with Park Breed, MD On 10/31/2014.   Specialty:  Specialist   Why:  Appointment is at 11:00   Contact information:   [place] [address] [phone]        Signed: Thornton Park ,MD 10/23/2014, 1:01 PM

## 2014-10-23 NOTE — Discharge Instructions (Signed)
Patient will be discharged home today with home health PT. They will change her dressing as needed She will remain partial weightbearing on the left lower extremity until follow-up with Dr. Sabra Heck. She will remain on Xarelto for DVT prophylaxis. She will follow up with Dr. Earnestine Leys in 10-14 days.

## 2014-10-23 NOTE — Progress Notes (Signed)
Order to discharge patient to home with home health, spouse and daughter at the bedside, discharge instructions given per MD orders, Rx.slip's for norco and symbicort inhaler given to patient. Patient and family verbalized understanding of discharge instructions given. Discharge via wheelchair with nurse tech- Marliss Coots.

## 2014-10-23 NOTE — Progress Notes (Signed)
Physical Therapy Treatment Patient Details Name: Gail Hardy MRN: [id number] DOB: 12/03/1945 Today's Date: 10/23/2014    History of Present Illness admitted for acute hospitalization status post L hip ORIF/nailing for stress fracture (sustained status post fall in June 2016).  Per telephone clarification per Dr. Sabra Heck, patient okay for WBAT to L LE; activity as tolerated (no bedrest restrictions).    PT Comments    Patient progressing well. She is modified independent in bed mobility and transfers with RW. Patient able to demonstrate better safety awareness with good hand placement and positioning. She is still slow with gait tasks, ambulating at a gait speed of 1.4 feet/sec. Patient would benefit from additional skilled PT intervention to improve gait safety, mobility and LE strength.  Follow Up Recommendations  Home health PT     Equipment Recommendations       Recommendations for Other Services       Precautions / Restrictions Precautions Precautions: Fall Precaution Comments: s/p L hip nailing procedure Restrictions Weight Bearing Restrictions: No LLE Weight Bearing: Weight bearing as tolerated    Mobility  Bed Mobility Overal bed mobility: Modified Independent Bed Mobility: Supine to Sit     Supine to sit: Modified independent (Device/Increase time)     General bed mobility comments: Patient able to transition supine to sit edge of bed, with head of bed flat, without bed rails. she is slow to initiate LLE movement but is able to scoot independently;  Transfers Overall transfer level: Modified independent Equipment used: Rolling walker (2 wheeled) Transfers: Sit to/from Stand Sit to Stand: Modified independent (Device/Increase time)         General transfer comment: Patient transfers sit<>Stand from bed, modified independent with RW. She is slow to initiate transfer but demonstrates good safety awareness with good foot and hand placement. x2  reps  Ambulation/Gait Ambulation/Gait assistance: Supervision Ambulation Distance (Feet): 200 Feet Assistive device: Rolling walker (2 wheeled) Gait Pattern/deviations: Step-through pattern;Decreased step length - right;Decreased step length - left;Narrow base of support Gait velocity: decreased Gait velocity interpretation: <1.8 ft/sec, indicative of risk for recurrent falls General Gait Details: Pt requires min Vcs for increased step length, increase erect posture and to improve foot clearance with better DF at heel strike. She is slow with gait tasks, ambulating at gait speed of 1.43 ft/sec.   Stairs Stairs: Yes Stairs assistance: Supervision Stair Management: Two rails Number of Stairs: 4 General stair comments: Able to ascend/descend 4 steps one step at a time supervision. patient able to verbalize and demonstrate correct technique without cues.  Wheelchair Mobility    Modified Rankin (Stroke Patients Only)       Balance                                    Cognition Arousal/Alertness: Awake/alert Behavior During Therapy: WFL for tasks assessed/performed Overall Cognitive Status: Within Functional Limits for tasks assessed                      Exercises General Exercises - Lower Extremity Ankle Circles/Pumps: AROM;Both;15 reps;Supine Heel Slides: AROM;Both;15 reps;Supine Hip ABduction/ADduction: AROM;Both;15 reps;Supine Straight Leg Raises: AROM;Strengthening;Both;15 reps;Supine Other Exercises Other Exercises: Pt required min Vcs for increased ROM to improve strength in BLE; Patient had difficulty initiating LLE movement, but was able to progress ROM with increased repetition. She fatigues after 15 reps; She had increased difficulty with LLE SLR flexion requiring AAROM;  General Comments        Pertinent Vitals/Pain Pain Assessment: 0-10 Pain Score: 9  Pain Location: left hip Pain Descriptors / Indicators: Aching;Burning;Sore Pain  Intervention(s): Limited activity within patient's tolerance;Monitored during session;Repositioned (reports that she just got pain meds this morning)    Home Living                      Prior Function            PT Goals (current goals can now be found in the care plan section) Acute Rehab PT Goals Patient Stated Goal: to be able to walk without pain PT Goal Formulation: With patient Time For Goal Achievement: 11/03/14 Potential to Achieve Goals: Good Progress towards PT goals: Progressing toward goals    Frequency  BID    PT Plan Current plan remains appropriate    Co-evaluation             End of Session Equipment Utilized During Treatment: Gait belt Activity Tolerance: Patient tolerated treatment well;Patient limited by fatigue;No increased pain Patient left: in chair;with call bell/phone within reach;with chair alarm set     Time: [id number] PT Time Calculation (min) (ACUTE ONLY): 25 min  Charges:  $Gait Training: 8-22 mins $Therapeutic Exercise: 8-22 mins                    G Codes:      Hopkins,Margaret PT, DPT 10/23/2014, 8:57 AM

## 2014-10-31 ENCOUNTER — Other Ambulatory Visit: Payer: Self-pay | Admitting: Family Medicine

## 2014-10-31 DIAGNOSIS — G2581 Restless legs syndrome: Secondary | ICD-10-CM

## 2014-10-31 NOTE — Telephone Encounter (Signed)
Last ov was on 10/13/2014. Last time this medication was refilled was on 06/23/2013 for 12 x.  Thanks,

## 2014-11-04 ENCOUNTER — Telehealth: Payer: Self-pay | Admitting: Family Medicine

## 2014-11-04 NOTE — Telephone Encounter (Signed)
Pt is scheduled for hospital f/u on 11/09/14 @ 10 am. A representative called with pt on the line and stated that pt was discharged from [facility] on 10/23/14 and was treated for a broken hip. Thanks TNP

## 2014-11-05 NOTE — Telephone Encounter (Signed)
Please review. Thanks!  

## 2014-11-08 ENCOUNTER — Other Ambulatory Visit: Payer: Self-pay | Admitting: Family Medicine

## 2014-11-08 NOTE — Telephone Encounter (Signed)
Happy to see., she may be in rehabilitation though

## 2014-11-09 ENCOUNTER — Ambulatory Visit (INDEPENDENT_AMBULATORY_CARE_PROVIDER_SITE_OTHER): Payer: PPO | Admitting: Family Medicine

## 2014-11-09 ENCOUNTER — Encounter: Payer: Self-pay | Admitting: Family Medicine

## 2014-11-09 VITALS — BP 100/60 | HR 88 | Temp 97.0°F | Resp 18

## 2014-11-09 DIAGNOSIS — Z09 Encounter for follow-up examination after completed treatment for conditions other than malignant neoplasm: Secondary | ICD-10-CM

## 2014-11-09 DIAGNOSIS — Z23 Encounter for immunization: Secondary | ICD-10-CM

## 2014-11-09 DIAGNOSIS — M84359G Stress fracture, hip, unspecified, subsequent encounter for fracture with delayed healing: Secondary | ICD-10-CM

## 2014-11-09 NOTE — Progress Notes (Signed)
Patient: Gail Hardy Female    DOB: [birth year]   69 y.o.   MRN: [id number] Visit Date: 11/09/2014  Today's Provider: Wilhemena Durie, MD   Chief Complaint  Patient presents with  . Hospitalization Follow-up   Subjective:    HPI  Follow up Hospitalization  Patient was admitted to [facility] on 10/19/14 and discharged on 10/23/14. She was treated for Stress Fracture of left neck femur S/P Intramedullary fixation . Treatment for this included .home health PT. They will change her dressing as needed She will remain partial weightbearing on the left lower extremity until follow-up with Dr. Sabra Heck. She will remain on Xarelto for DVT prophylaxis. She will follow up with Dr. Earnestine Leys in 10-14 days. She reports fair compliance with treatment. She reports this condition is Improved.  ------------------------------------------------------------------------------------     Allergies  Allergen Reactions  . Diazepam     Other reaction(s): Feeling agitated (finding)   Previous Medications   ABILIFY 30 MG TABLET    Take 15 mg by mouth daily.    ALPRAZOLAM (XANAX) 0.5 MG TABLET    Take 1 tablet by mouth as needed.   BUDESONIDE-FORMOTEROL (SYMBICORT) 160-4.5 MCG/ACT INHALER    Inhale 2 puffs into the lungs 2 (two) times daily.   BUPROPION (WELLBUTRIN SR) 150 MG 12 HR TABLET    TAKE 1 TABLET BY MOUTH ONCE A DAY   CITALOPRAM (CELEXA) 20 MG TABLET    Take 40 mg by mouth every morning.    CLONAZEPAM (KLONOPIN) 1 MG TABLET    TAKE ONE TABLET BY MOUTH EVERY 8 HOURS AS NEEDED FOR ANXIETY   CYANOCOBALAMIN 1000 MCG CAPS    Take 1 capsule by mouth daily.   DIPHENOXYLATE-ATROPINE (LOMOTIL) 2.5-0.025 MG PER TABLET    TAKE ONE TABLET BY MOUTH EVERY 6 HOURS AS NEEDED FOR DIARRHEA   FERROUS SULFATE 325 (65 FE) MG TABLET    Take 325 mg by mouth daily with breakfast.    GABAPENTIN (NEURONTIN) 400 MG CAPSULE    TAKE 1 CAPSULE BY MOUTH 3 TIMES A DAY   HYDROCODONE-ACETAMINOPHEN (NORCO/VICODIN)  5-325 MG PER TABLET    Take 1-2 tablets by mouth every 4 (four) hours as needed for moderate pain.   LATANOPROST (XALATAN) 0.005 % OPHTHALMIC SOLUTION    Place 1 drop into both eyes at bedtime.    PHENTERMINE (ADIPEX-P) 37.5 MG TABLET    Take 1 tablet (37.5 mg total) by mouth daily.   RIVAROXABAN (XARELTO) 10 MG TABS TABLET    Take 1 tablet (10 mg total) by mouth daily.   SIMVASTATIN (ZOCOR) 20 MG TABLET    Take 1 tablet by mouth at bedtime.   TEMAZEPAM (RESTORIL) 30 MG CAPSULE    TAKE 1 CAPSULE BY MOUTH ONCE DAILY    Review of Systems  Constitutional: Negative.   HENT: Negative.   Eyes: Negative.   Respiratory: Negative.   Cardiovascular: Negative.   Gastrointestinal: Negative.   Endocrine: Negative.   Genitourinary: Negative.   Musculoskeletal: Negative.        Stiffness on her  Left leg only  Skin: Negative.   Allergic/Immunologic: Negative.   Neurological: Negative.   Hematological: Negative.   Psychiatric/Behavioral: Negative.     Social History  Substance Use Topics  . Smoking status: Former Smoker -- 10 years    Types: Cigarettes    Quit date: 02/10/1989  . Smokeless tobacco: Never Used  . Alcohol Use: No   Objective:   BP  100/60 mmHg  Pulse 88  Temp(Src) 97 F (36.1 C) (Oral)  Resp 18  Wt   SpO2 97%  Physical Exam  Constitutional: She is oriented to person, place, and time. She appears well-developed and well-nourished.  HENT:  Head: Normocephalic and atraumatic.  Right Ear: External ear normal.  Left Ear: External ear normal.  Nose: Nose normal.  Neck: Neck supple.  Cardiovascular: Normal rate, regular rhythm and normal heart sounds.   Pulmonary/Chest: Effort normal and breath sounds normal.  Abdominal: Soft.  Neurological: She is alert and oriented to person, place, and time.  Skin: Skin is warm and dry.  Psychiatric: She has a normal mood and affect. Her behavior is normal. Judgment and thought content normal.        Assessment & Plan:     1.  Hospital discharge follow-up Stressed the importance of rehabilitation.  2. Stress fracture of hip with delayed healing   3. Need for influenza vaccination  - Flu vaccine HIGH DOSE PF 4. Morbid obesity 5. Bipolar depression Presently controlled 6 DVT prophylaxis      Wilhemena Durie, MD  [facility]

## 2014-11-15 ENCOUNTER — Other Ambulatory Visit: Payer: Self-pay | Admitting: Family Medicine

## 2014-12-01 NOTE — Patient Outreach (Signed)
[place] [place] [facility]) Care Management  12/01/2014  JEILY GUTHRIDGE 1945/06/06 [id number]   Referral from [facility], assigned to Quinn Plowman, RN for patient outreach.  Shabreka Coulon L. Sheddrick Lattanzio, [facility] Care Management Assistant

## 2014-12-13 ENCOUNTER — Other Ambulatory Visit: Payer: Self-pay

## 2014-12-13 NOTE — Patient Outreach (Signed)
[place] [facility]) Care Management  12/13/2014  TERRIKA ZUVER 1945/05/25 [id number]  Telephone call to patient regarding [facility] referral. Unable to reach patient or leave voice message due to phone ringing only.   PLAN; RNCM will attempt 2nd telephone call to patient within  3  Business days.   Quinn Plowman RN,BSN,CCM [place] Coordinator [phone]

## 2014-12-16 ENCOUNTER — Other Ambulatory Visit: Payer: Self-pay

## 2014-12-16 NOTE — Patient Outreach (Signed)
[place] [facility]) Care Management  12/16/2014  Gail Hardy [birth year] [id number]   SUBJECTIVE: Telephone call to patient regarding  [facility] referral.  HIPAA verified with patient. Discussed and offered [facility] care management services to patient. Patient refused services at this time.  Patient states, "I think I'm ok for now."  RNCM offered to mail patient [facility] care management outreach letter and brochure. Patient agreed to mailing.   PLAN; RNCM will forward patient to [place] to close due to refusal of services. RNCM will notify patients primary MD of refusal of services.   Quinn Plowman RN,BSN,CCM [place] Coordinator [phone]

## 2014-12-23 NOTE — Patient Outreach (Signed)
[facility]) Care Management  12/23/2014  [name] 28-Jun-1945 [id number]   Notification received from Quinn Plowman, RN to close case due to patient refusing services.  [name] L. Raidon Swanner, [place] Care Management Assistant

## 2015-02-20 ENCOUNTER — Other Ambulatory Visit: Payer: Self-pay | Admitting: Family Medicine

## 2015-03-15 ENCOUNTER — Ambulatory Visit (INDEPENDENT_AMBULATORY_CARE_PROVIDER_SITE_OTHER): Payer: PPO | Admitting: Family Medicine

## 2015-03-15 ENCOUNTER — Encounter: Payer: Self-pay | Admitting: Family Medicine

## 2015-03-15 VITALS — BP 112/72 | HR 100 | Temp 97.7°F | Resp 14 | Ht 59.75 in | Wt 211.0 lb

## 2015-03-15 DIAGNOSIS — Z0000 Encounter for general adult medical examination without abnormal findings: Secondary | ICD-10-CM

## 2015-03-15 DIAGNOSIS — Z1239 Encounter for other screening for malignant neoplasm of breast: Secondary | ICD-10-CM

## 2015-03-15 DIAGNOSIS — I639 Cerebral infarction, unspecified: Secondary | ICD-10-CM | POA: Insufficient documentation

## 2015-03-15 DIAGNOSIS — E669 Obesity, unspecified: Secondary | ICD-10-CM | POA: Diagnosis not present

## 2015-03-15 MED ORDER — PHENTERMINE HCL 37.5 MG PO TABS: 37.5000 mg | ORAL_TABLET | Freq: Every day | ORAL | Status: DC

## 2015-03-15 NOTE — Progress Notes (Signed)
Patient ID: Gail Hardy, female   DOB: 1945/05/01, 70 y.o.   MRN: [id number]  Visit Date: 03/15/2015  Today's Provider: Wilhemena Durie, MD   Chief Complaint  Patient presents with  . Medicare Wellness   Subjective:   Gail Hardy is a 70 y.o. female who presents today for her Subsequent Annual Wellness Visit. She feels fairly well. She reports exercising none. She reports she is sleeping fairly well.  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 Does not have to repeat EGD.  Prevnar 08/19/2013  Pap smear a long time ago-could not find the record, patient had hysterectomy before but not due to abnormal pap smears she states, she declines having this done today.  Review of Systems  Constitutional: Positive for fatigue and unexpected weight change.  HENT: Positive for rhinorrhea and trouble swallowing.   Respiratory: Positive for choking.   Gastrointestinal: Positive for abdominal distention.  Genitourinary: Positive for frequency.  Musculoskeletal: Positive for arthralgias.  Skin: Positive for rash.  Neurological: Positive for tremors and weakness.  Hematological: Bruises/bleeds easily.  Psychiatric/Behavioral: Positive for suicidal ideas. Negative for self-injury. The patient is nervous/anxious.   All other systems reviewed and are negative.   Patient Active Problem List   Diagnosis Date Noted  . Cerebrovascular accident (CVA) ([place]) 03/15/2015  . Pressure ulcer 10/22/2014  . Stress fracture of hip with delayed healing 10/19/2014  . Allergic rhinitis 09/20/2014  . Anxiety disorder 09/20/2014  . Absolute anemia 09/20/2014  . Back ache 09/20/2014  . Bipolar 1 disorder, depressed ([place]) 09/20/2014  . Body mass index of 60 or higher ([place]) 09/20/2014  . Anemia due to blood loss 09/20/2014  . Aneurysm, cerebral 09/20/2014  . Chronic headache 09/20/2014  . Claustrophobia 09/20/2014  . Cerebral vascular accident ([place]) 09/20/2014   . Acute confusion due to medical condition 09/20/2014  . Clinical depression 09/20/2014  . Abnormal liver enzymes 09/20/2014  . 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 Diaphragmatic hernia 09/20/2014  . Gastric erosion 09/20/2014  . Enterogastritis 09/20/2014  . 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 Hypercholesteremia 09/20/2014  . Cannot sleep 09/20/2014  . Malaise and fatigue 09/20/2014  . Bad memory 09/20/2014  . Altered blood in stool 09/20/2014  . Candida glabrata infection 09/20/2014  . Decreased motor strength 09/20/2014  . Adiposity 09/20/2014  . Arthritis, degenerative 09/20/2014  . Excess weight 09/20/2014  . Paralysis agitans ([place]) 09/20/2014  . Breakdown 09/20/2014  . PE (pulmonary embolism) 09/20/2014  . Hypercholesterolemia without hypertriglyceridemia 09/20/2014  . Restless leg 09/20/2014  . Neuralgia neuritis, sciatic nerve 09/20/2014  . Gastric ulcer 09/20/2014  . Fast heart beat 09/20/2014  . Temporary cerebral vascular dysfunction 09/20/2014  . Has a tremor 09/20/2014  . Adynamia 09/20/2014  . Classical migraine with intractable migraine 08/10/2013  . Dysphagia, unspecified(787.20) 08/05/2012  . Paraesophageal hernia 08/04/2012    Social History   Social History  . Marital Status: Married    Spouse Name: [name]  . Number of Children: 2  . Years of Education: N/A   Occupational History  . Not on file.   Social History Main Topics  . Smoking status: Former Smoker -- 10 years    Types: Cigarettes    Quit date: 02/10/1989  . Smokeless tobacco: Never Used  . Alcohol Use: No  . Drug Use: No  . Sexual Activity: No   Other Topics Concern  . Not on file   Social History Narrative    Past Surgical History  Procedure Laterality Date  . Abdominal hysterectomy    . Cholecystectomy    . Upper gi endoscopy  2013, 2014    [facility]  . Colonoscopy  2013    [facility]  . Cerebral aneurysm repair  2011    stent and coil insertion  . Hernia  repair  2014    lap paraesophageal repair  . Intramedullary (im) nail intertrochanteric Left 10/19/2014    Procedure: INTRAMEDULLARY (IM) NAIL INTERTROCHANTRIC;  Surgeon: Earnestine Leys, MD;  Location: [facility] ORS;  Service: Orthopedics;  Laterality: Left;    Her family history includes COPD in her mother; Emphysema in her father; Heart disease in her brother and mother.    Outpatient Prescriptions Prior to Visit  Medication Sig Dispense Refill  . ABILIFY 30 MG tablet Take 15 mg by mouth daily.     Marland Kitchen buPROPion (WELLBUTRIN SR) 150 MG 12 hr tablet TAKE 1 TABLET BY MOUTH ONCE A DAY (Patient taking differently: TAKE 1 TABLET BY MOUTH ONCE A DAY in am) 30 tablet 12  . clonazePAM (KLONOPIN) 1 MG tablet TAKE ONE TABLET BY MOUTH EVERY 8 HOURS AS NEEDED FOR ANXIETY 90 tablet 5  . Cyanocobalamin 1000 MCG CAPS Take 1 capsule by mouth daily.    . diphenoxylate-atropine (LOMOTIL) 2.5-0.025 MG per tablet TAKE ONE TABLET BY MOUTH EVERY 6 HOURS AS NEEDED FOR DIARRHEA 100 tablet 12  . ferrous sulfate 325 (65 FE) MG tablet Take 325 mg by mouth daily with breakfast.     . gabapentin (NEURONTIN) 400 MG capsule TAKE 1 CAPSULE BY MOUTH 3 TIMES A DAY 90 capsule 12  . latanoprost (XALATAN) 0.005 % ophthalmic solution Place 1 drop into both eyes at bedtime.     . promethazine (PHENERGAN) 25 MG tablet TAKE 1/2 TO 1 TABLET BY MOUTH EVERY 6 HOURS AS NEEDED 100 tablet 3  . simvastatin (ZOCOR) 20 MG tablet Take 1 tablet by mouth at bedtime.    . temazepam (RESTORIL) 30 MG capsule TAKE 1 CAPSULE BY MOUTH ONCE DAILY 30 capsule 5  . HYDROcodone-acetaminophen (NORCO/VICODIN) 5-325 MG per tablet Take 1-2 tablets by mouth every 4 (four) hours as needed for moderate pain. (Patient not taking: Reported on 03/15/2015) 40 tablet 0  . phentermine (ADIPEX-P) 37.5 MG tablet Take 1 tablet (37.5 mg total) by mouth daily. (Patient not taking: Reported on 03/15/2015) 30 tablet 4  . ALPRAZolam (XANAX) 0.5 MG tablet Take 1 tablet by mouth as needed.     . budesonide-formoterol (SYMBICORT) 160-4.5 MCG/ACT inhaler Inhale 2 puffs into the lungs 2 (two) times daily. 1 Inhaler 0  . citalopram (CELEXA) 20 MG tablet Take 40 mg by mouth every morning.     . rivaroxaban (XARELTO) 10 MG TABS tablet Take 1 tablet (10 mg total) by mouth daily. 30 tablet 0   No facility-administered medications prior to visit.    Allergies  Allergen Reactions  . Diazepam     Other reaction(s): Feeling agitated (finding)    Patient Care Team: Jerrol Banana., MD as PCP - General (Family Medicine) Robert Bellow, MD (General Surgery)  Objective:   Vitals:  Filed Vitals:   03/15/15 1006  BP: 112/72  Pulse: 100  Temp: 97.7 F (36.5 C)  Resp: 14  Height: 4' 11.75" (1.518 m)  Weight: 211 lb (95.709 kg)    Physical Exam  Activities of Daily Living In your present state of health, do you have any difficulty performing the following activities: 03/15/2015 10/19/2014  Hearing? Y -  Vision? N -  Difficulty concentrating or making decisions? Y -  Walking or climbing stairs? Y -  Dressing or bathing? Y -  Doing errands, shopping? Y N    Fall Risk Assessment Fall Risk  03/15/2015 09/20/2014  Falls in the past year? Yes Yes  Number falls in past yr: 2 or more 2 or more  Injury with Fall? Yes Yes  Risk Factor Category  High Fall Risk -  Risk for fall due to : - Impaired balance/gait  Follow up Falls prevention discussed -     Depression Screen PHQ 2/9 Scores 03/15/2015 09/20/2014  PHQ - 2 Score 6 6  PHQ- 9 Score 19 9    Cognitive Testing - 6-CIT    Year: 0 4 points  Month: 0 3 points  Memorize "Pia Mau, [address], [place]"  Time (within 1 hour:) 0 3 points  Count backwards from 20: 0 2 4 points  Name months of year: 0 2 4 points  Repeat Address: 0 2 4 6 8 10  points   Total Score: 8/28  Interpretation : Normal (0-7) Abnormal (8-28)    Assessment & Plan:     Annual Wellness Visit  Reviewed patient's Family Medical  History Reviewed and updated list of patient's medical providers Assessment of cognitive impairment was done Assessed patient's functional ability Established a written schedule for health screening [place] Completed and Reviewed  Exercise Activities and Dietary recommendations Goals    . Exercise 150 minutes per week (moderate activity)      Discussed health benefits of physical activity, and encouraged her to engage in regular exercise appropriate for her age and condition.     1. Medicare annual wellness visit, subsequent  2. Adiposity Refill provided. - phentermine (ADIPEX-P) 37.5 MG tablet; Take 1 tablet (37.5 mg total) by mouth daily.  Dispense: 30 tablet; Refill: 4  3. Breast cancer screening - MM Digital Screening; Future 4.s/p repair left hip fracture/repair  May 2016. I have done the exam and reviewed the above chart and it is accurate to the best of my knowledge.  Miguel Aschoff MD [facility] 03/15/2015 10:12 AM

## 2015-03-22 ENCOUNTER — Other Ambulatory Visit: Payer: Self-pay | Admitting: Family Medicine

## 2015-03-22 ENCOUNTER — Ambulatory Visit
Admission: RE | Admit: 2015-03-22 | Discharge: 2015-03-22 | Disposition: A | Discharge status: Home / Self Care | Payer: PPO | Source: Ambulatory Visit | Attending: Family Medicine | Admitting: Family Medicine

## 2015-03-22 DIAGNOSIS — R928 Other abnormal and inconclusive findings on diagnostic imaging of breast: Secondary | ICD-10-CM | POA: Diagnosis not present

## 2015-03-22 DIAGNOSIS — Z1239 Encounter for other screening for malignant neoplasm of breast: Secondary | ICD-10-CM

## 2015-04-03 DIAGNOSIS — H401121 Primary open-angle glaucoma, left eye, mild stage: Secondary | ICD-10-CM | POA: Diagnosis not present

## 2015-04-10 ENCOUNTER — Telehealth: Payer: Self-pay | Admitting: Family Medicine

## 2015-04-10 NOTE — Telephone Encounter (Signed)
Pt would like something in pill form for diarrhea sent to [facility]. Pt stated the diarrhea started Sunday 04/09/15 and she was having it often but today it has slowed down a lot and she has only had it about 3 times today. Please advise. Thanks TNP

## 2015-04-11 NOTE — Telephone Encounter (Signed)
Please review. Thanks!  

## 2015-04-11 NOTE — Telephone Encounter (Signed)
Pt advised on voicemail-aa 

## 2015-04-11 NOTE — Telephone Encounter (Signed)
Increase fluids--should slowly resolve

## 2015-04-12 ENCOUNTER — Other Ambulatory Visit: Payer: Self-pay | Admitting: Family Medicine

## 2015-05-06 ENCOUNTER — Other Ambulatory Visit: Payer: Self-pay | Admitting: Family Medicine

## 2015-06-15 DIAGNOSIS — S7002XA Contusion of left hip, initial encounter: Secondary | ICD-10-CM | POA: Diagnosis not present

## 2015-07-24 ENCOUNTER — Ambulatory Visit: Payer: PPO | Admitting: Family Medicine

## 2015-07-31 ENCOUNTER — Ambulatory Visit (INDEPENDENT_AMBULATORY_CARE_PROVIDER_SITE_OTHER): Payer: PPO | Admitting: Family Medicine

## 2015-07-31 VITALS — BP 100/62 | HR 92 | Temp 98.2°F | Resp 16

## 2015-07-31 DIAGNOSIS — D500 Iron deficiency anemia secondary to blood loss (chronic): Secondary | ICD-10-CM

## 2015-07-31 DIAGNOSIS — R251 Tremor, unspecified: Secondary | ICD-10-CM

## 2015-07-31 DIAGNOSIS — F319 Bipolar disorder, unspecified: Secondary | ICD-10-CM | POA: Diagnosis not present

## 2015-07-31 DIAGNOSIS — E7800 Pure hypercholesterolemia, unspecified: Secondary | ICD-10-CM | POA: Diagnosis not present

## 2015-07-31 DIAGNOSIS — F418 Other specified anxiety disorders: Secondary | ICD-10-CM | POA: Diagnosis not present

## 2015-07-31 DIAGNOSIS — G4700 Insomnia, unspecified: Secondary | ICD-10-CM

## 2015-07-31 DIAGNOSIS — F329 Major depressive disorder, single episode, unspecified: Secondary | ICD-10-CM

## 2015-07-31 DIAGNOSIS — F32A Depression, unspecified: Secondary | ICD-10-CM

## 2015-07-31 DIAGNOSIS — E669 Obesity, unspecified: Secondary | ICD-10-CM | POA: Diagnosis not present

## 2015-07-31 MED ORDER — PHENTERMINE HCL 37.5 MG PO TABS: 37.5000 mg | ORAL_TABLET | Freq: Every day | ORAL | Status: DC

## 2015-07-31 MED ORDER — TEMAZEPAM 30 MG PO CAPS: 30.0000 mg | ORAL_CAPSULE | Freq: Every day | ORAL | Status: DC

## 2015-07-31 NOTE — Progress Notes (Signed)
Patient ID: Gail Hardy, female   DOB: 18-Nov-1945, 70 y.o.   MRN: [id number]    Subjective:  HPI  Patient is here for 4 months follow up. Last visit was in February 2016 for Wellness Visit. Depression: patient is still taking Abilify, Bupropion, Celexa, Klonopin about 3 times a week. She has gained weight and is depressed over that right now. Depression screen [facility] 2/9 07/31/2015 03/15/2015 09/20/2014  Decreased Interest 0 3 3  Down, Depressed, Hopeless 3 3 3   PHQ - 2 Score 3 6 6   Altered sleeping 3 1 0  Tired, decreased energy 2 3 3   Change in appetite 3 3 0  Feeling bad or failure about yourself  3 3 0  Trouble concentrating 0 1 0  Moving slowly or fidgety/restless 0 1 0  Suicidal thoughts 0 1 0  PHQ-9 Score 14 19 9   Difficult doing work/chores Very difficult Somewhat difficult Very difficult     Insomnia: Takes Temazepam about 4 times a week.  Hyperlipidemia: Patient has not been taking her Simvastatin in a long time she states.   Obesity: She is taking Phentermine still.  Prior to Admission medications   Medication Sig Start Date End Date Taking? Authorizing Provider  ABILIFY 30 MG tablet Take 15 mg by mouth daily.  07/02/12  Yes Historical Provider, MD  buPROPion (WELLBUTRIN SR) 150 MG 12 hr tablet TAKE 1 TABLET BY MOUTH ONCE A DAY Patient taking differently: TAKE 1 TABLET BY MOUTH ONCE A DAY in am 09/04/14  Yes Richard Maceo Pro., MD  citalopram (CELEXA) 40 MG tablet TAKE 1 TABLET BY MOUTH ONCE A DAY 05/06/15  Yes Richard Maceo Pro., MD  clonazePAM (KLONOPIN) 1 MG tablet TAKE 1 TABLET BY MOUTH EVERY 8 HOURS AS NEEDED FOR ANXIETY 04/12/15  Yes Jerrol Banana., MD  diphenoxylate-atropine (LOMOTIL) 2.5-0.025 MG per tablet TAKE ONE TABLET BY MOUTH EVERY 6 HOURS AS NEEDED FOR DIARRHEA 09/20/14  Yes Jerrol Banana., MD  gabapentin (NEURONTIN) 400 MG capsule TAKE 1 CAPSULE BY MOUTH 3 TIMES A DAY 10/31/14  Yes Richard Maceo Pro., MD  latanoprost (XALATAN) 0.005 %  ophthalmic solution Place 1 drop into both eyes at bedtime.  08/01/12  Yes Historical Provider, MD  phentermine (ADIPEX-P) 37.5 MG tablet Take 1 tablet (37.5 mg total) by mouth daily. 03/15/15  Yes Richard Maceo Pro., MD  promethazine (PHENERGAN) 25 MG tablet TAKE 1/2 TO 1 TABLET BY MOUTH EVERY 6 HOURS AS NEEDED 11/16/14  Yes Jerrol Banana., MD  temazepam (RESTORIL) 30 MG capsule TAKE 1 CAPSULE BY MOUTH ONCE DAILY 02/20/15  Yes Jerrol Banana., MD    Patient Active Problem List   Diagnosis Date Noted  . Cerebrovascular accident (CVA) ([place]) 03/15/2015  . Pressure ulcer 10/22/2014  . Stress fracture of hip with delayed healing 10/19/2014  . Allergic rhinitis 09/20/2014  . Anxiety disorder 09/20/2014  . Absolute anemia 09/20/2014  . Back ache 09/20/2014  . Bipolar 1 disorder, depressed ([place]) 09/20/2014  . Body mass index of 60 or higher ([place]) 09/20/2014  . Anemia due to blood loss 09/20/2014  . Aneurysm, cerebral 09/20/2014  . Chronic headache 09/20/2014  . Claustrophobia 09/20/2014  . Cerebral vascular accident ([place]) 09/20/2014  . Acute confusion due to medical condition 09/20/2014  . Clinical depression 09/20/2014  . Abnormal liver enzymes 09/20/2014  . 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 Diaphragmatic hernia 09/20/2014  . Gastric erosion 09/20/2014  . Enterogastritis 09/20/2014  .  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 Hypercholesteremia 09/20/2014  . Cannot sleep 09/20/2014  . Malaise and fatigue 09/20/2014  . Bad memory 09/20/2014  . Altered blood in stool 09/20/2014  . Candida glabrata infection 09/20/2014  . Decreased motor strength 09/20/2014  . Adiposity 09/20/2014  . Arthritis, degenerative 09/20/2014  . Excess weight 09/20/2014  . Paralysis agitans ([place]) 09/20/2014  . Breakdown 09/20/2014  . PE (pulmonary embolism) 09/20/2014  . Hypercholesterolemia without hypertriglyceridemia 09/20/2014  . Restless leg 09/20/2014  . Neuralgia neuritis, sciatic  nerve 09/20/2014  . Gastric ulcer 09/20/2014  . Fast heart beat 09/20/2014  . Temporary cerebral vascular dysfunction 09/20/2014  . Has a tremor 09/20/2014  . Adynamia 09/20/2014  . Classical migraine with intractable migraine 08/10/2013  . Dysphagia, unspecified(787.20) 08/05/2012  . Paraesophageal hernia 08/04/2012    Past Medical History  Diagnosis Date  . Aneurysm ([employer]) 2011  . Blood transfusion without reported diagnosis 2013,2014    x 2  . Glaucoma   . Hyperlipidemia   . Anemia   . Hernia   . Depression   . Pulmonary emboli ([place]) 2011  . Cancer ([place])   . Family history of adverse reaction to anesthesia     daughter nausea and vomiting  . Stroke [facility]) 2010    Social History   Social History  . Marital Status: Married    Spouse Name: [name]  . Number of Children: 2  . Years of Education: N/A   Occupational History  . Not on file.   Social History Main Topics  . Smoking status: Former Smoker -- 10 years    Types: Cigarettes    Quit date: 02/10/1989  . Smokeless tobacco: Never Used  . Alcohol Use: No  . Drug Use: No  . Sexual Activity: No   Other Topics Concern  . Not on file   Social History Narrative    Allergies  Allergen Reactions  . Diazepam     Other reaction(s): Feeling agitated (finding)    Review of Systems  Constitutional: Positive for malaise/fatigue.  Respiratory: Negative.   Cardiovascular: Negative.   Musculoskeletal: Positive for joint pain.  Neurological: Negative.   Psychiatric/Behavioral: Positive for depression. The patient is nervous/anxious.   Stable.  Immunization History  Administered Date(s) Administered  . Influenza, High Dose Seasonal PF 11/09/2014   Objective:  BP 100/62 mmHg  Pulse 92  Temp(Src) 98.2 F (36.8 C)  Resp 16  Wt   Physical Exam  Constitutional: She is oriented to person, place, and time and well-developed, well-nourished, and in no distress.  HENT:  Head: Normocephalic and atraumatic.  Eyes:  Conjunctivae are normal. Pupils are equal, round, and reactive to light.  Neck: Normal range of motion. Neck supple.  Cardiovascular: Normal rate, regular rhythm, normal heart sounds and intact distal pulses.   No murmur heard. Pulmonary/Chest: Effort normal and breath sounds normal. No respiratory distress. She has no wheezes.  Neurological: She is alert and oriented to person, place, and time.    Lab Results  Component Value Date   WBC 7.8 10/22/2014   HGB 11.7* 10/22/2014   HCT 34.0* 10/22/2014   PLT 179 10/22/2014   GLUCOSE 101* 10/21/2014   INR 0.96 10/13/2014   HGBA1C 5.4 10/21/2014    CMP     Component Value Date/Time   NA 139 10/21/2014 0444   NA 139 10/13/2012 1345   K 4.0 10/21/2014 0444   K 3.6 10/13/2012 1345   CL 106 10/21/2014 0444   CL 107 10/13/2012  1345   CO2 28 10/21/2014 0444   CO2 27 10/13/2012 1345   GLUCOSE 101* 10/21/2014 0444   GLUCOSE 80 10/13/2012 1345   BUN 12 10/21/2014 0444   BUN 18 10/13/2012 1345   CREATININE 0.80 10/21/2014 0444   CREATININE 0.73 10/13/2012 1345   CALCIUM 8.4* 10/21/2014 0444   CALCIUM 9.0 10/13/2012 1345   PROT 7.7 06/02/2012 1327   ALBUMIN 3.7 06/02/2012 1327   AST 15 06/02/2012 1327   ALT 22 06/02/2012 1327   ALKPHOS 111 06/02/2012 1327   BILITOT 0.2 06/02/2012 1327   GFRNONAA >60 10/21/2014 0444   GFRNONAA >60 10/13/2012 1345   GFRAA >60 10/21/2014 0444   GFRAA >60 10/13/2012 1345    Assessment and Plan :  1. Adiposity Refill given, discussed with patient weaning off Phentermine. Will re address on the next visit again. Discussed eating certain amount of food and exercising, - CBC with Differential/Platelet - Comprehensive metabolic panel - HgB 123456 - phentermine (ADIPEX-P) 37.5 MG tablet; Take 1 tablet (37.5 mg total) by mouth daily.  Dispense: 30 tablet; Refill: 2 Patient has great difficulty keeping the weight off because of the side effects of the stabilizer/Abilify 2. Hypercholesteremia Check labs. -  Lipid Panel With LDL/HDL Ratio  3. Has a tremor Stable.  4. Clinical depression/bipolar depression Stable. - TSH  5. Cannot sleep Refill given,  6. Bipolar 1 disorder, depressed ([place]) 7. Anemia  Check labs. 8. Other specified anxiety disorders  Patient was seen and examined by Dr. Eulas Post and note was scribed by Theressa Millard, [place].    Miguel Aschoff MD [facility] 07/31/2015 11:18 AM

## 2015-08-01 LAB — CBC WITH DIFFERENTIAL/PLATELET
Basophils Absolute: 0 10*3/uL (ref 0.0–0.2)
Basos: 1 %
EOS (ABSOLUTE): 0.2 10*3/uL (ref 0.0–0.4)
Eos: 3 %
Hematocrit: 42.5 % (ref 34.0–46.6)
Hemoglobin: 14.4 g/dL (ref 11.1–15.9)
Immature Grans (Abs): 0 10*3/uL (ref 0.0–0.1)
Immature Granulocytes: 0 %
Lymphocytes Absolute: 3.3 10*3/uL — ABNORMAL HIGH (ref 0.7–3.1)
Lymphs: 50 %
MCH: 29.2 pg (ref 26.6–33.0)
MCHC: 33.9 g/dL (ref 31.5–35.7)
MCV: 86 fL (ref 79–97)
Monocytes Absolute: 0.6 10*3/uL (ref 0.1–0.9)
Monocytes: 10 %
Neutrophils Absolute: 2.3 10*3/uL (ref 1.4–7.0)
Neutrophils: 36 %
Platelets: 250 10*3/uL (ref 150–379)
RBC: 4.93 x10E6/uL (ref 3.77–5.28)
RDW: 14.7 % (ref 12.3–15.4)
WBC: 6.4 10*3/uL (ref 3.4–10.8)

## 2015-08-01 LAB — LIPID PANEL WITH LDL/HDL RATIO
Cholesterol, Total: 283 mg/dL — ABNORMAL HIGH (ref 100–199)
HDL: 72 mg/dL (ref >39)
LDL Calculated: 167 mg/dL — ABNORMAL HIGH (ref 0–99)
LDl/HDL Ratio: 2.3 ratio units (ref 0.0–3.2)
Triglycerides: 222 mg/dL — ABNORMAL HIGH (ref 0–149)
VLDL Cholesterol Cal: 44 mg/dL — ABNORMAL HIGH (ref 5–40)

## 2015-08-01 LAB — COMPREHENSIVE METABOLIC PANEL
ALBUMIN: 4.3 g/dL (ref 3.6–4.8)
ALK PHOS: 99 IU/L (ref 39–117)
ALT: 23 IU/L (ref 0–32)
AST: 20 IU/L (ref 0–40)
Albumin/Globulin Ratio: 1.3 (ref 1.2–2.2)
BILIRUBIN TOTAL: 0.3 mg/dL (ref 0.0–1.2)
BUN / CREAT RATIO: 21 (ref 12–28)
BUN: 16 mg/dL (ref 8–27)
CHLORIDE: 102 mmol/L (ref 96–106)
CO2: 23 mmol/L (ref 18–29)
Calcium: 9.3 mg/dL (ref 8.7–10.3)
Creatinine, Ser: 0.77 mg/dL (ref 0.57–1.00)
GFR calc Af Amer: 91 mL/min/{1.73_m2} (ref >59)
GFR calc non Af Amer: 79 mL/min/{1.73_m2} (ref >59)
GLOBULIN, TOTAL: 3.2 g/dL (ref 1.5–4.5)
GLUCOSE: 83 mg/dL (ref 65–99)
POTASSIUM: 4.6 mmol/L (ref 3.5–5.2)
SODIUM: 142 mmol/L (ref 134–144)
Total Protein: 7.5 g/dL (ref 6.0–8.5)

## 2015-08-01 LAB — TSH: TSH: 1.12 u[IU]/mL (ref 0.450–4.500)

## 2015-08-01 LAB — HEMOGLOBIN A1C
Est. average glucose Bld gHb Est-mCnc: 105 mg/dL
Hgb A1c MFr Bld: 5.3 % (ref 4.8–5.6)

## 2015-08-08 DIAGNOSIS — M7072 Other bursitis of hip, left hip: Secondary | ICD-10-CM | POA: Diagnosis not present

## 2015-09-07 ENCOUNTER — Ambulatory Visit: Payer: PPO | Admitting: Family Medicine

## 2015-09-07 DIAGNOSIS — S7002XA Contusion of left hip, initial encounter: Secondary | ICD-10-CM | POA: Diagnosis not present

## 2015-09-08 ENCOUNTER — Other Ambulatory Visit: Payer: Self-pay | Admitting: Family Medicine

## 2015-09-14 ENCOUNTER — Ambulatory Visit: Payer: PPO | Admitting: Family Medicine

## 2015-09-15 ENCOUNTER — Emergency Department: Payer: PPO

## 2015-09-15 ENCOUNTER — Emergency Department
Admission: EM | Admit: 2015-09-15 | Discharge: 2015-09-15 | Disposition: A | Discharge status: Home / Self Care | Payer: PPO | Attending: Student in an Organized Health Care Education/Training Program | Admitting: Student in an Organized Health Care Education/Training Program

## 2015-09-15 ENCOUNTER — Encounter: Payer: Self-pay | Admitting: Emergency Medicine

## 2015-09-15 DIAGNOSIS — S59912A Unspecified injury of left forearm, initial encounter: Secondary | ICD-10-CM | POA: Diagnosis not present

## 2015-09-15 DIAGNOSIS — S8002XA Contusion of left knee, initial encounter: Secondary | ICD-10-CM

## 2015-09-15 DIAGNOSIS — M79632 Pain in left forearm: Secondary | ICD-10-CM | POA: Diagnosis not present

## 2015-09-15 DIAGNOSIS — Y999 Unspecified external cause status: Secondary | ICD-10-CM | POA: Insufficient documentation

## 2015-09-15 DIAGNOSIS — Z859 Personal history of malignant neoplasm, unspecified: Secondary | ICD-10-CM | POA: Insufficient documentation

## 2015-09-15 DIAGNOSIS — S0990XA Unspecified injury of head, initial encounter: Secondary | ICD-10-CM

## 2015-09-15 DIAGNOSIS — S8992XA Unspecified injury of left lower leg, initial encounter: Secondary | ICD-10-CM | POA: Diagnosis not present

## 2015-09-15 DIAGNOSIS — S300XXA Contusion of lower back and pelvis, initial encounter: Secondary | ICD-10-CM | POA: Diagnosis not present

## 2015-09-15 DIAGNOSIS — S40022A Contusion of left upper arm, initial encounter: Secondary | ICD-10-CM | POA: Insufficient documentation

## 2015-09-15 DIAGNOSIS — Z87891 Personal history of nicotine dependence: Secondary | ICD-10-CM | POA: Diagnosis not present

## 2015-09-15 DIAGNOSIS — Z79899 Other long term (current) drug therapy: Secondary | ICD-10-CM | POA: Insufficient documentation

## 2015-09-15 DIAGNOSIS — Y929 Unspecified place or not applicable: Secondary | ICD-10-CM | POA: Diagnosis not present

## 2015-09-15 DIAGNOSIS — Y939 Activity, unspecified: Secondary | ICD-10-CM | POA: Insufficient documentation

## 2015-09-15 DIAGNOSIS — M25552 Pain in left hip: Secondary | ICD-10-CM | POA: Diagnosis not present

## 2015-09-15 DIAGNOSIS — S4992XA Unspecified injury of left shoulder and upper arm, initial encounter: Secondary | ICD-10-CM | POA: Diagnosis not present

## 2015-09-15 DIAGNOSIS — M25512 Pain in left shoulder: Secondary | ICD-10-CM | POA: Diagnosis not present

## 2015-09-15 DIAGNOSIS — W06XXXA Fall from bed, initial encounter: Secondary | ICD-10-CM | POA: Diagnosis not present

## 2015-09-15 DIAGNOSIS — S79912A Unspecified injury of left hip, initial encounter: Secondary | ICD-10-CM | POA: Diagnosis not present

## 2015-09-15 DIAGNOSIS — Z791 Long term (current) use of non-steroidal anti-inflammatories (NSAID): Secondary | ICD-10-CM | POA: Diagnosis not present

## 2015-09-15 DIAGNOSIS — M79621 Pain in right upper arm: Secondary | ICD-10-CM | POA: Diagnosis not present

## 2015-09-15 DIAGNOSIS — W19XXXA Unspecified fall, initial encounter: Secondary | ICD-10-CM

## 2015-09-15 DIAGNOSIS — S4991XA Unspecified injury of right shoulder and upper arm, initial encounter: Secondary | ICD-10-CM | POA: Diagnosis not present

## 2015-09-15 MED ORDER — ONDANSETRON 4 MG PO TBDP
4.0000 mg | ORAL_TABLET | Freq: Once | ORAL | Status: AC
  Administered 2015-09-15: 4 mg via ORAL
  Filled 2015-09-15: qty 1

## 2015-09-15 MED ORDER — ONDANSETRON HCL 4 MG/2ML IJ SOLN: 4.0000 mg | Freq: Once | INTRAMUSCULAR | Status: DC

## 2015-09-15 MED ORDER — HYDROCODONE-ACETAMINOPHEN 5-325 MG PO TABS: 1.0000 | ORAL_TABLET | ORAL | 0 refills | Status: DC | PRN

## 2015-09-15 MED ORDER — HYDROMORPHONE HCL 1 MG/ML IJ SOLN
0.5000 mg | Freq: Once | INTRAMUSCULAR | Status: AC
  Administered 2015-09-15: 0.5 mg via INTRAVENOUS
  Filled 2015-09-15: qty 1

## 2015-09-15 NOTE — ED Notes (Signed)
Discharge instructions reviewed with patient. Patient verbalized understanding. Patient ambulated to lobby without difficulty.   

## 2015-09-15 NOTE — ED Provider Notes (Signed)
[facility] Emergency Department Provider Note  ____________________________________________  Time seen: Approximately 7:28 PM  I have reviewed the triage vital signs and the nursing notes.   HISTORY  Chief Complaint Fall    HPI Gail Hardy is a 70 y.o. female presents for evaluation of falling at home 5 days ago. Patient complains of headache, left-sided pelvic pain. Left shoulder pain, left arm pain. Left knee pain. Describes pain as 10 over 10 nonradiating. Denies any loss consciousness or numbness or tingling.   Past Medical History:  Diagnosis Date  . Anemia   . Aneurysm ([place]) 2011  . Blood transfusion without reported diagnosis 2013,2014   x 2  . Cancer ([place])   . Depression   . Family history of adverse reaction to anesthesia    daughter nausea and vomiting  . Glaucoma   . Hernia   . Hyperlipidemia   . Pulmonary emboli ([place]) 2011  . Stroke [facility]) 2010    Patient Active Problem List   Diagnosis Date Noted  . Cerebrovascular accident (CVA) ([place]) 03/15/2015  . Pressure ulcer 10/22/2014  . Stress fracture of hip with delayed healing 10/19/2014  . Allergic rhinitis 09/20/2014  . Anxiety disorder 09/20/2014  . Absolute anemia 09/20/2014  . Back ache 09/20/2014  . Bipolar 1 disorder, depressed ([place]) 09/20/2014  . Body mass index of 60 or higher ([place]) 09/20/2014  . Anemia due to blood loss 09/20/2014  . Aneurysm, cerebral 09/20/2014  . Chronic headache 09/20/2014  . Claustrophobia 09/20/2014  . Cerebral vascular accident ([place]) 09/20/2014  . Acute confusion due to medical condition 09/20/2014  . Clinical depression 09/20/2014  . Abnormal liver enzymes 09/20/2014  . 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 Diaphragmatic hernia 09/20/2014  . Gastric erosion 09/20/2014  . Enterogastritis 09/20/2014  . 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 Hypercholesteremia 09/20/2014  . Cannot sleep 09/20/2014  . Malaise and fatigue 09/20/2014   . Bad memory 09/20/2014  . Altered blood in stool 09/20/2014  . Candida glabrata infection 09/20/2014  . Decreased motor strength 09/20/2014  . Adiposity 09/20/2014  . Arthritis, degenerative 09/20/2014  . Excess weight 09/20/2014  . Paralysis agitans ([place]) 09/20/2014  . Breakdown 09/20/2014  . PE (pulmonary embolism) 09/20/2014  . Hypercholesterolemia without hypertriglyceridemia 09/20/2014  . Restless leg 09/20/2014  . Neuralgia neuritis, sciatic nerve 09/20/2014  . Gastric ulcer 09/20/2014  . Fast heart beat 09/20/2014  . Temporary cerebral vascular dysfunction 09/20/2014  . Has a tremor 09/20/2014  . Adynamia 09/20/2014  . Classical migraine with intractable migraine 08/10/2013  . Dysphagia, unspecified(787.20) 08/05/2012  . Paraesophageal hernia 08/04/2012    Past Surgical History:  Procedure Laterality Date  . ABDOMINAL HYSTERECTOMY    . BREAST EXCISIONAL BIOPSY Right    pt states she had "lumps removed from breast as a teen"  . CEREBRAL ANEURYSM REPAIR  2011   stent and coil insertion  . CHOLECYSTECTOMY    . COLONOSCOPY  2013   Ifthikhar  . HERNIA REPAIR  2014   lap paraesophageal repair  . INTRAMEDULLARY (IM) NAIL INTERTROCHANTERIC Left 10/19/2014   Procedure: INTRAMEDULLARY (IM) NAIL INTERTROCHANTRIC;  Surgeon: Earnestine Leys, MD;  Location: [facility] ORS;  Service: Orthopedics;  Laterality: Left;  . UPPER GI ENDOSCOPY  2013, 2014   [name]    Prior to Admission medications   Medication Sig Start Date End Date Taking? Authorizing Provider  ARIPiprazole (ABILIFY) 30 MG tablet TAKE 1 TABLET BY MOUTH ONCE A DAY 09/08/15   Jerrol Banana., MD  buPROPion (WELLBUTRIN SR) 150 MG 12 hr tablet TAKE 1 TABLET BY MOUTH ONCE A DAY 09/08/15   Richard Maceo Pro., MD  citalopram (CELEXA) 40 MG tablet TAKE 1 TABLET BY MOUTH ONCE A DAY 05/06/15   Richard Maceo Pro., MD  clonazePAM (KLONOPIN) 1 MG tablet TAKE 1 TABLET BY MOUTH EVERY 8 HOURS AS NEEDED FOR ANXIETY 04/12/15    Jerrol Banana., MD  diphenoxylate-atropine (LOMOTIL) 2.5-0.025 MG per tablet TAKE ONE TABLET BY MOUTH EVERY 6 HOURS AS NEEDED FOR DIARRHEA 09/20/14   Jerrol Banana., MD  gabapentin (NEURONTIN) 400 MG capsule TAKE 1 CAPSULE BY MOUTH 3 TIMES A DAY 10/31/14   Richard Maceo Pro., MD  HYDROcodone-acetaminophen (NORCO) 5-325 MG tablet Take 1 tablet by mouth every 4 (four) hours as needed for moderate pain. 09/15/15   Pierce Crane Renato Spellman, PA-C  latanoprost (XALATAN) 0.005 % ophthalmic solution Place 1 drop into both eyes at bedtime.  08/01/12   Historical Provider, MD  phentermine (ADIPEX-P) 37.5 MG tablet Take 1 tablet (37.5 mg total) by mouth daily. 07/31/15   Richard Maceo Pro., MD  promethazine (PHENERGAN) 25 MG tablet TAKE 1/2 TO 1 TABLET BY MOUTH EVERY 6 HOURS AS NEEDED 11/16/14   Jerrol Banana., MD  temazepam (RESTORIL) 30 MG capsule Take 1 capsule (30 mg total) by mouth daily. 07/31/15   Richard Maceo Pro., MD    Allergies Diazepam  Family History  Problem Relation Age of Onset  . Emphysema Father   . COPD Mother   . Heart disease Mother   . Heart disease Brother     Social History Social History  Substance Use Topics  . Smoking status: Former Smoker    Years: 10.00    Types: Cigarettes    Quit date: 02/10/1989  . Smokeless tobacco: Never Used  . Alcohol use No    Review of Systems Constitutional: No fever/chills Eyes: No visual changes. Cardiovascular: Denies chest pain. Respiratory: Denies shortness of breath. Musculoskeletal: Positive for shoulder pain, left hip pain, left arm and elbow pain. Left knee pain positive. Skin: Negative for rash. Neurological: Negative for headaches, focal weakness or numbness.  10-point ROS otherwise negative.  ____________________________________________   PHYSICAL EXAM:  VITAL SIGNS: ED Triage Vitals [09/15/15 1907]  Enc Vitals Group     BP 116/68     Pulse Rate 98     Resp 16     Temp 98.1 F (36.7 C)      Temp Source Oral     SpO2 96 %     Weight 219 lb (99.3 kg)     Height 4\' 11"  (1.499 m)     Head Circumference      Peak Flow      Pain Score 8     Pain Loc      Pain Edu?      Excl. in [place]?     Constitutional: Alert and oriented. Well appearing and in no acute distress. Mouth/Throat: Mucous membranes are moist.  Oropharynx non-erythematous. Neck: No stridor.   Cardiovascular: Normal rate, regular rhythm. Grossly normal heart sounds.  Good peripheral circulation. Respiratory: Normal respiratory effort.  No retractions. Lungs CTAB. Musculoskeletal: No lower extremity tenderness nor edema.  No joint effusions. Neurologic:  Normal speech and language. No gross focal neurologic deficits are appreciated. No gait instability. Skin:  Skin is warm, dry and intact. No rash noted. Psychiatric: Mood and affect are normal. Speech and behavior are normal.  ____________________________________________   LABS (all labs ordered are listed, but only abnormal results are displayed)  Labs Reviewed - No data to display ____________________________________________  EKG   ____________________________________________  RADIOLOGY  No acute osseous or intracranial findings. ____________________________________________   PROCEDURES  Procedure(s) performed: None  Critical Care performed: No  ____________________________________________   INITIAL IMPRESSION / ASSESSMENT AND PLAN / ED COURSE  Pertinent labs & imaging results that were available during my care of the patient were reviewed by me and considered in my medical decision making (see chart for details).  Condition discharged home with prescription for hydrocodone 5/325. She is follow up with orthopedic doctor or primary care as directed.  Clinical Course    ____________________________________________   FINAL CLINICAL IMPRESSION(S) / ED DIAGNOSES  Final diagnoses:  Fall, initial encounter  Head injury, initial encounter   Pelvic contusion, initial encounter  Knee contusion, left, initial encounter  Arm contusion, left, initial encounter     This chart was dictated using voice recognition software/Dragon. Despite best efforts to proofread, errors can occur which can change the meaning. Any change was purely unintentional.    Arlyss Repress, PA-C 09/15/15 2204    Merlyn Lot, MD 09/16/15 [phone]

## 2015-09-15 NOTE — ED Triage Notes (Signed)
Pt states fell out of bed approx 5 days ago. Pt complains of left arm and leg pain. Cms intact to extremities. Pt appears in on acute distress.

## 2015-09-18 ENCOUNTER — Ambulatory Visit: Payer: PPO | Admitting: Family Medicine

## 2015-09-27 ENCOUNTER — Ambulatory Visit (INDEPENDENT_AMBULATORY_CARE_PROVIDER_SITE_OTHER): Payer: PPO | Admitting: Family Medicine

## 2015-09-27 ENCOUNTER — Other Ambulatory Visit: Payer: Self-pay

## 2015-09-27 ENCOUNTER — Encounter: Payer: Self-pay | Admitting: Family Medicine

## 2015-09-27 VITALS — BP 110/72 | HR 116 | Temp 98.6°F | Resp 14 | Wt 223.0 lb

## 2015-09-27 DIAGNOSIS — J0100 Acute maxillary sinusitis, unspecified: Secondary | ICD-10-CM

## 2015-09-27 DIAGNOSIS — W19XXXS Unspecified fall, sequela: Secondary | ICD-10-CM

## 2015-09-27 DIAGNOSIS — M719 Bursopathy, unspecified: Secondary | ICD-10-CM

## 2015-09-27 MED ORDER — AMOXICILLIN-POT CLAVULANATE 875-125 MG PO TABS: 1.0000 | ORAL_TABLET | Freq: Two times a day (BID) | ORAL | 0 refills | Status: DC

## 2015-09-27 NOTE — Telephone Encounter (Signed)
Pt called after her OV today requesting that you call in Diflucan for discharge. [employer]. Renaldo Fiddler, CMA

## 2015-09-27 NOTE — Progress Notes (Signed)
Patient: Gail Hardy Female    DOB: 06-06-1945   70 y.o.   MRN: [id number] Visit Date: 09/27/2015  Today's Provider: Wilhemena Durie, MD   Chief Complaint  Patient presents with  . Hospitalization Follow-up  . Fall   Subjective:    HPI Patient comes in today for a hospital follow up. Patient was seen in the ER on 09/15/2015 due to a fall. Patient reports that she had several xrays, and they were all normal. She reports that she also had a head CT which was normal. Patient reports that she has had another fall since then a few days ago. She reports that she currently has lower back pain that is more on the left side. Patient reports that the pain medication that was given in the ER (Norco) has not helped.  Patient also mentions that she is finding food particles in her mucus when she blows her nose. Patient reports that this happens with liquids and solids. Patient reports that this has been going on for "quite some time".  Patient is also requesting another refill on Phentermine 37.5mg . She feels that it has not been as affective as it was in the beginning, but it still helps with appetite.     Allergies  Allergen Reactions  . Diazepam     Other reaction(s): Feeling agitated (finding)   Current Meds  Medication Sig  . ARIPiprazole (ABILIFY) 30 MG tablet TAKE 1 TABLET BY MOUTH ONCE A DAY  . buPROPion (WELLBUTRIN SR) 150 MG 12 hr tablet TAKE 1 TABLET BY MOUTH ONCE A DAY  . citalopram (CELEXA) 40 MG tablet TAKE 1 TABLET BY MOUTH ONCE A DAY  . clonazePAM (KLONOPIN) 1 MG tablet TAKE 1 TABLET BY MOUTH EVERY 8 HOURS AS NEEDED FOR ANXIETY  . diphenoxylate-atropine (LOMOTIL) 2.5-0.025 MG per tablet TAKE ONE TABLET BY MOUTH EVERY 6 HOURS AS NEEDED FOR DIARRHEA  . gabapentin (NEURONTIN) 400 MG capsule TAKE 1 CAPSULE BY MOUTH 3 TIMES A DAY  . HYDROcodone-acetaminophen (NORCO) 5-325 MG tablet Take 1 tablet by mouth every 4 (four) hours as needed for moderate pain.  Marland Kitchen latanoprost  (XALATAN) 0.005 % ophthalmic solution Place 1 drop into both eyes at bedtime.   . phentermine (ADIPEX-P) 37.5 MG tablet Take 1 tablet (37.5 mg total) by mouth daily.  . promethazine (PHENERGAN) 25 MG tablet TAKE 1/2 TO 1 TABLET BY MOUTH EVERY 6 HOURS AS NEEDED  . temazepam (RESTORIL) 30 MG capsule Take 1 capsule (30 mg total) by mouth daily.    Review of Systems  Constitutional: Positive for activity change and fatigue. Negative for chills, diaphoresis, fever and unexpected weight change.  HENT: Positive for congestion, postnasal drip, rhinorrhea and sinus pressure. Negative for trouble swallowing and voice change.   Eyes: Negative.   Respiratory: Negative.   Cardiovascular: Positive for leg swelling.  Gastrointestinal: Negative.   Endocrine: Negative.   Genitourinary: Negative.   Musculoskeletal: Positive for arthralgias, back pain, gait problem and myalgias.  Allergic/Immunologic: Negative.   Neurological: Positive for tremors.  Psychiatric/Behavioral: The patient is nervous/anxious.     Social History  Substance Use Topics  . Smoking status: Former Smoker    Years: 10.00    Types: Cigarettes    Quit date: 02/10/1989  . Smokeless tobacco: Never Used  . Alcohol use No   Objective:   BP 110/72 (BP Location: Right Arm, Patient Position: Sitting, Cuff Size: Normal)   Pulse (!) 116   Temp 98.6 F (  37 C)   Resp 14   Wt 223 lb (101.2 kg)   BMI 45.04 kg/m   Physical Exam  Constitutional: She is oriented to person, place, and time. She appears well-developed and well-nourished.  HENT:  Head: Normocephalic and atraumatic.  Right Ear: External ear normal.  Left Ear: External ear normal.  Nose: Nose normal.  Maxillary sinus tenderness.   Eyes: Conjunctivae are normal. Pupils are equal, round, and reactive to light.  Neck: Normal range of motion. No thyromegaly present.  Cardiovascular: Normal rate, regular rhythm and normal heart sounds.   Pulmonary/Chest: Effort normal and  breath sounds normal.  Abdominal: Soft.  Lymphadenopathy:    She has no cervical adenopathy.  Neurological: She is alert and oriented to person, place, and time. Coordination abnormal.  Skin: Skin is warm and dry.  Psychiatric: She has a normal mood and affect. Her behavior is normal.  She is tender over the left greater trochanter. She has trouble moving due to her obesity. Arthritis stiffness also contributes to immobility. Parkinsonian symptoms secondary to side effects from her Abilify.      Assessment & Plan:     1. Fall, sequela  obesity, deconditioning, mental illness,and significant arthritisIncrease her risk of falls daily. Physical therapy could help  2. Acute maxillary sinusitis, recurrence not specified Treat as below. If not improved, refer to ENT. I'm not sure what to make of her sinus complaints. - amoxicillin-clavulanate (AUGMENTIN) 875-125 MG tablet; Take 1 tablet by mouth 2 (two) times daily.  Dispense: 20 tablet; Refill: 0  3. Bursitis May use heating pad to help with pain. She has seen orthopedics. May benefit from another visit. 4. Bipolar depression  This is controlled with Abilify but I think this contributes to her obesity. 5. Morbid obesity This is a major problem for the patient but I'm not sure she can overcome it. She requests phentermine but it has not been helping her and it is not she uses as an ongoing therapy.       Marshell Dilauro Cranford Mon, MD  [facility]

## 2015-09-28 MED ORDER — FLUCONAZOLE 150 MG PO TABS: 150.0000 mg | ORAL_TABLET | Freq: Once | ORAL | 1 refills | Status: AC

## 2015-10-02 ENCOUNTER — Other Ambulatory Visit: Payer: Self-pay

## 2015-10-02 NOTE — Telephone Encounter (Signed)
Refill request from [place] for Lomotil, please review-aa

## 2015-10-03 MED ORDER — DIPHENOXYLATE-ATROPINE 2.5-0.025 MG PO TABS: 1.0000 | ORAL_TABLET | Freq: Four times a day (QID) | ORAL | 5 refills | Status: DC | PRN

## 2015-10-04 DIAGNOSIS — H401121 Primary open-angle glaucoma, left eye, mild stage: Secondary | ICD-10-CM | POA: Diagnosis not present

## 2015-10-10 ENCOUNTER — Other Ambulatory Visit: Payer: Self-pay

## 2015-10-10 MED ORDER — CLONAZEPAM 1 MG PO TABS: 1.0000 mg | ORAL_TABLET | Freq: Three times a day (TID) | ORAL | 4 refills | Status: DC | PRN

## 2015-10-10 NOTE — Telephone Encounter (Signed)
Refill request from [place] for [employer]

## 2015-10-12 ENCOUNTER — Telehealth: Payer: Self-pay | Admitting: Family Medicine

## 2015-10-12 NOTE — Telephone Encounter (Signed)
Please review-aa 

## 2015-10-12 NOTE — Telephone Encounter (Signed)
Pt was seen 2 weeks ago and was having a brown discharge.  Pt states Dr Rosanna Randy gave a pill to take but it has not cleared up.  Pt is asking if there is something else she can take.  [employer].  [id number]

## 2015-10-19 NOTE — Telephone Encounter (Signed)
If she is not having symptoms with abdominal think I would do anything. May need appointment to have this evaluated at some point in time.

## 2015-10-19 NOTE — Telephone Encounter (Signed)
Spoke with patient and she states she is having discharge still but no other symptoms, she does not want to see another provider so appointment made for 9/18 and patient advised if issue gets worse to come in and see another provider before that.

## 2015-10-23 DIAGNOSIS — M25552 Pain in left hip: Secondary | ICD-10-CM | POA: Diagnosis not present

## 2015-10-23 DIAGNOSIS — S7002XD Contusion of left hip, subsequent encounter: Secondary | ICD-10-CM | POA: Diagnosis not present

## 2015-10-23 DIAGNOSIS — S8002XA Contusion of left knee, initial encounter: Secondary | ICD-10-CM | POA: Diagnosis not present

## 2015-10-31 ENCOUNTER — Encounter: Payer: Self-pay | Admitting: Family Medicine

## 2015-10-31 ENCOUNTER — Ambulatory Visit (INDEPENDENT_AMBULATORY_CARE_PROVIDER_SITE_OTHER): Payer: PPO | Admitting: Family Medicine

## 2015-10-31 VITALS — BP 118/72 | HR 98 | Temp 98.0°F | Resp 16 | Wt 221.0 lb

## 2015-10-31 DIAGNOSIS — N760 Acute vaginitis: Secondary | ICD-10-CM | POA: Diagnosis not present

## 2015-10-31 DIAGNOSIS — Z23 Encounter for immunization: Secondary | ICD-10-CM

## 2015-10-31 MED ORDER — METRONIDAZOLE 250 MG PO TABS: 250.0000 mg | ORAL_TABLET | Freq: Three times a day (TID) | ORAL | 0 refills | Status: DC

## 2015-10-31 NOTE — Progress Notes (Signed)
Subjective:  HPI Pt is here today for vaginal discharge. She describes it as a brown colored discharge. Pt reports that he does have an odor. Pt denies abdominal pain, pelvic pain, itching or irritation. She says it is a constant discharge. She states that she was given fluconazole about a month ago for this and it did not help. She is requesting flagyl for this today. She has no abdominal pain or pelvic pain. No vaginal bleeding. No change in her bowel or urine habits. She is just concerned and anxious about the vaginal discharge.  Prior to Admission medications   Medication Sig Start Date End Date Taking? Authorizing Provider  amoxicillin-clavulanate (AUGMENTIN) 875-125 MG tablet Take 1 tablet by mouth 2 (two) times daily. 09/27/15   Jerrol Banana., MD  ARIPiprazole (ABILIFY) 30 MG tablet TAKE 1 TABLET BY MOUTH ONCE A DAY 09/08/15   Jerrol Banana., MD  buPROPion [facility] SR) 150 MG 12 hr tablet TAKE 1 TABLET BY MOUTH ONCE A DAY 09/08/15   Richard Maceo Pro., MD  citalopram (CELEXA) 40 MG tablet TAKE 1 TABLET BY MOUTH ONCE A DAY 05/06/15   Richard Maceo Pro., MD  clonazePAM (KLONOPIN) 1 MG tablet Take 1 tablet (1 mg total) by mouth every 8 (eight) hours as needed. for anxiety 10/10/15   Jerrol Banana., MD  diphenoxylate-atropine (LOMOTIL) 2.5-0.025 MG tablet Take 1 tablet by mouth 4 (four) times daily as needed for diarrhea or loose stools. 10/03/15   Jerrol Banana., MD  gabapentin (NEURONTIN) 400 MG capsule TAKE 1 CAPSULE BY MOUTH 3 TIMES A DAY 10/31/14   Richard Maceo Pro., MD  HYDROcodone-acetaminophen [facility]) 5-325 MG tablet Take 1 tablet by mouth every 4 (four) hours as needed for moderate pain. 09/15/15   Pierce Crane Beers, PA-C  latanoprost (XALATAN) 0.005 % ophthalmic solution Place 1 drop into both eyes at bedtime.  08/01/12   Historical Provider, MD  phentermine (ADIPEX-P) 37.5 MG tablet Take 1 tablet (37.5 mg total) by mouth daily. 07/31/15   Richard Maceo Pro., MD  promethazine (PHENERGAN) 25 MG tablet TAKE 1/2 TO 1 TABLET BY MOUTH EVERY 6 HOURS AS NEEDED 11/16/14   Jerrol Banana., MD  temazepam (RESTORIL) 30 MG capsule Take 1 capsule (30 mg total) by mouth daily. 07/31/15   Richard Maceo Pro., MD    Patient Active Problem List   Diagnosis Date Noted  . Cerebrovascular accident (CVA) ([place]) 03/15/2015  . Pressure ulcer 10/22/2014  . Stress fracture of hip with delayed healing 10/19/2014  . Allergic rhinitis 09/20/2014  . Anxiety disorder 09/20/2014  . Absolute anemia 09/20/2014  . Back ache 09/20/2014  . Bipolar 1 disorder, depressed ([place]) 09/20/2014  . Body mass index of 60 or higher ([place]) 09/20/2014  . Anemia due to blood loss 09/20/2014  . Aneurysm, cerebral 09/20/2014  . Chronic headache 09/20/2014  . Claustrophobia 09/20/2014  . Cerebral vascular accident ([place]) 09/20/2014  . Acute confusion due to medical condition 09/20/2014  . Clinical depression 09/20/2014  . Abnormal liver enzymes 09/20/2014  . 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 Diaphragmatic hernia 09/20/2014  . Gastric erosion 09/20/2014  . Enterogastritis 09/20/2014  . 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 Hypercholesteremia 09/20/2014  . Cannot sleep 09/20/2014  . Malaise and fatigue 09/20/2014  . Bad memory 09/20/2014  . Altered blood in stool 09/20/2014  . Candida glabrata infection 09/20/2014  . Decreased motor strength 09/20/2014  . Adiposity  09/20/2014  . Arthritis, degenerative 09/20/2014  . Excess weight 09/20/2014  . Paralysis agitans ([place]) 09/20/2014  . Breakdown 09/20/2014  . PE (pulmonary embolism) 09/20/2014  . Hypercholesterolemia without hypertriglyceridemia 09/20/2014  . Restless leg 09/20/2014  . Neuralgia neuritis, sciatic nerve 09/20/2014  . Gastric ulcer 09/20/2014  . Fast heart beat 09/20/2014  . Temporary cerebral vascular dysfunction 09/20/2014  . Has a tremor 09/20/2014  . Adynamia 09/20/2014  . Classical  migraine with intractable migraine 08/10/2013  . Dysphagia, unspecified(787.20) 08/05/2012  . Paraesophageal hernia 08/04/2012    Past Medical History:  Diagnosis Date  . Anemia   . Aneurysm ([place]) 2011  . Blood transfusion without reported diagnosis 2013,2014   x 2  . Cancer ([place])   . Depression   . Family history of adverse reaction to anesthesia    daughter nausea and vomiting  . Glaucoma   . Hernia   . Hyperlipidemia   . Pulmonary emboli ([place]) 2011  . Stroke [facility]) 2010    Social History   Social History  . Marital status: Married    Spouse name: [name]  . Number of children: 2  . Years of education: N/A   Occupational History  . Not on file.   Social History Main Topics  . Smoking status: Former Smoker    Years: 10.00    Types: Cigarettes    Quit date: 02/10/1989  . Smokeless tobacco: Never Used  . Alcohol use No  . Drug use: No  . Sexual activity: No   Other Topics Concern  . Not on file   Social History Narrative  . No narrative on file    Allergies  Allergen Reactions  . Diazepam     Other reaction(s): Feeling agitated (finding)    Review of Systems  Constitutional: Negative.   HENT: Negative.   Eyes: Negative.   Respiratory: Negative.   Cardiovascular: Negative.   Gastrointestinal: Negative.   Genitourinary: Negative.        Brown colored vaginal discharge.   Musculoskeletal: Negative.   Skin: Negative.   Neurological: Negative.     Immunization History  Administered Date(s) Administered  . Influenza, High Dose Seasonal PF 11/09/2014   Objective:  BP 118/72 (BP Location: Left Arm, Patient Position: Sitting, Cuff Size: Large)   Pulse 98   Temp 98 F (36.7 C) (Oral)   Resp 16   Wt 221 lb (100.2 kg)   BMI 44.64 kg/m   Physical Exam  Constitutional: She is oriented to person, place, and time and well-developed, well-nourished, and in no distress.  HENT:  Head: Normocephalic and atraumatic.  Eyes: Conjunctivae and EOM are  normal. Pupils are equal, round, and reactive to light.  Neck: Normal range of motion. Neck supple.  Cardiovascular: Normal rate, regular rhythm, normal heart sounds and intact distal pulses.   Pulmonary/Chest: Effort normal and breath sounds normal.  Abdominal: Soft. Bowel sounds are normal.  Genitourinary:  Genitourinary Comments: Very minimal cream-colored discharge in vagina. Exam otherwise completely unremarkable. Wet prep is negative.  Musculoskeletal: Normal range of motion.  Neurological: She is alert and oriented to person, place, and time. She has normal reflexes. Gait normal. GCS score is 15.  Skin: Skin is warm and dry.  Psychiatric: Mood, memory, affect and judgment normal.    Lab Results  Component Value Date   WBC 6.4 07/31/2015   HGB 11.7 (L) 10/22/2014   HCT 42.5 07/31/2015   PLT 250 07/31/2015   GLUCOSE 83 07/31/2015   CHOL 283 (  H) 07/31/2015   TRIG 222 (H) 07/31/2015   HDL 72 07/31/2015   LDLCALC 167 (H) 07/31/2015   TSH 1.120 07/31/2015   INR 0.96 10/13/2014   HGBA1C 5.3 07/31/2015    CMP     Component Value Date/Time   NA 142 07/31/2015 1217   NA 139 10/13/2012 1345   K 4.6 07/31/2015 1217   K 3.6 10/13/2012 1345   CL 102 07/31/2015 1217   CL 107 10/13/2012 1345   CO2 23 07/31/2015 1217   CO2 27 10/13/2012 1345   GLUCOSE 83 07/31/2015 1217   GLUCOSE 101 (H) 10/21/2014 0444   GLUCOSE 80 10/13/2012 1345   BUN 16 07/31/2015 1217   BUN 18 10/13/2012 1345   CREATININE 0.77 07/31/2015 1217   CREATININE 0.73 10/13/2012 1345   CALCIUM 9.3 07/31/2015 1217   CALCIUM 9.0 10/13/2012 1345   PROT 7.5 07/31/2015 1217   PROT 7.7 06/02/2012 1327   ALBUMIN 4.3 07/31/2015 1217   ALBUMIN 3.7 06/02/2012 1327   AST 20 07/31/2015 1217   AST 15 06/02/2012 1327   ALT 23 07/31/2015 1217   ALT 22 06/02/2012 1327   ALKPHOS 99 07/31/2015 1217   ALKPHOS 111 06/02/2012 1327   BILITOT 0.3 07/31/2015 1217   BILITOT 0.2 06/02/2012 1327   GFRNONAA 79 07/31/2015 1217    GFRNONAA >60 10/13/2012 1345   GFRAA 91 07/31/2015 1217   GFRAA >60 10/13/2012 1345    Assessment and Plan :  1. Vaginitis Patient which wishes to have this treated although this appears to be normal at this time. After discussion with her we'll do a short course of treatment with low-dose Flagyl. I really think this will be a self-limited problem. - metroNIDAZOLE (FLAGYL) 250 MG tablet; Take 1 tablet (250 mg total) by mouth 3 (three) times daily.  Dispense: 9 tablet; Refill: 0  2. Need for influenza vaccination  - Flu vaccine HIGH DOSE PF  3. Obesity  4. Bipolar disorder Controlled as long as she takes Abilify. HPI, Exam, and A&P Transcribed under the direction and in the presence of Richard L. Cranford Mon, MD  Electronically Signed: Webb Laws, [place] MD [facility] 10/31/2015 2:49 PM

## 2015-11-01 ENCOUNTER — Ambulatory Visit: Payer: PPO | Admitting: Family Medicine

## 2015-11-10 ENCOUNTER — Other Ambulatory Visit: Payer: Self-pay | Admitting: Family Medicine

## 2015-11-10 DIAGNOSIS — G2581 Restless legs syndrome: Secondary | ICD-10-CM

## 2016-01-10 ENCOUNTER — Encounter: Payer: Self-pay | Admitting: Family Medicine

## 2016-01-10 ENCOUNTER — Ambulatory Visit (INDEPENDENT_AMBULATORY_CARE_PROVIDER_SITE_OTHER): Payer: PPO | Admitting: Family Medicine

## 2016-01-10 VITALS — BP 130/80 | HR 88 | Temp 97.8°F | Resp 16 | Wt 218.0 lb

## 2016-01-10 DIAGNOSIS — N760 Acute vaginitis: Secondary | ICD-10-CM | POA: Diagnosis not present

## 2016-01-10 DIAGNOSIS — F339 Major depressive disorder, recurrent, unspecified: Secondary | ICD-10-CM

## 2016-01-10 DIAGNOSIS — E669 Obesity, unspecified: Secondary | ICD-10-CM | POA: Diagnosis not present

## 2016-01-10 MED ORDER — METRONIDAZOLE 250 MG PO TABS: 250.0000 mg | ORAL_TABLET | Freq: Two times a day (BID) | ORAL | 0 refills | Status: DC

## 2016-01-10 MED ORDER — AMPHETAMINE-DEXTROAMPHETAMINE 10 MG PO TABS: 10.0000 mg | ORAL_TABLET | Freq: Two times a day (BID) | ORAL | 0 refills | Status: DC

## 2016-01-10 NOTE — Progress Notes (Signed)
Subjective:  HPI Pt is here for follow up of depression. She reports that she wants to stay in the bed all the time. She is depressed most of the time because of her weight. " I don't have any clothes that I can wear."  Husband reports that she is used to being 118 instead of 218. Also the time of year makes her depressed. She fell at their home the other night when she got out of bed. She reports that her left leg gave away and she fell on the foot board of the bed. She has bruises on her arm, back buttocks and side.   She is also having vaginal discharge. She was in for this back in September and wet prep was negative, and she had very minimal cream-colored discharge and otherwise the exam was unremarkable. Pt reports that the flagyl made it better, but never fully went away. She reports that it got better but she still had to wear a pad.   Prior to Admission medications   Medication Sig Start Date End Date Taking? Authorizing Provider  ARIPiprazole (ABILIFY) 30 MG tablet TAKE 1 TABLET BY MOUTH ONCE A DAY 09/08/15   Jerrol Banana., MD  buPROPion [facility] SR) 150 MG 12 hr tablet TAKE 1 TABLET BY MOUTH ONCE A DAY 09/08/15   Richard Maceo Pro., MD  citalopram (CELEXA) 40 MG tablet TAKE 1 TABLET BY MOUTH ONCE A DAY 05/06/15   Richard Maceo Pro., MD  clonazePAM (KLONOPIN) 1 MG tablet Take 1 tablet (1 mg total) by mouth every 8 (eight) hours as needed. for anxiety 10/10/15   Jerrol Banana., MD  diphenoxylate-atropine (LOMOTIL) 2.5-0.025 MG tablet Take 1 tablet by mouth 4 (four) times daily as needed for diarrhea or loose stools. 10/03/15   Jerrol Banana., MD  gabapentin (NEURONTIN) 400 MG capsule TAKE 1 CAPSULE BY MOUTH 3 TIMES A DAY 11/13/15   Richard Maceo Pro., MD  HYDROcodone-acetaminophen [facility]) 5-325 MG tablet Take 1 tablet by mouth every 4 (four) hours as needed for moderate pain. 09/15/15   Pierce Crane Beers, PA-C  latanoprost (XALATAN) 0.005 % ophthalmic solution  Place 1 drop into both eyes at bedtime.  08/01/12   Historical Provider, MD  meloxicam (MOBIC) 15 MG tablet  10/23/15   Historical Provider, MD  metroNIDAZOLE (FLAGYL) 250 MG tablet Take 1 tablet (250 mg total) by mouth 3 (three) times daily. 10/31/15   Richard Maceo Pro., MD  phentermine (ADIPEX-P) 37.5 MG tablet Take 1 tablet (37.5 mg total) by mouth daily. 07/31/15   Richard Maceo Pro., MD  promethazine (PHENERGAN) 25 MG tablet TAKE 1/2 TO 1 TABLET BY MOUTH EVERY 6 HOURS AS NEEDED 11/16/14   Jerrol Banana., MD  temazepam (RESTORIL) 30 MG capsule Take 1 capsule (30 mg total) by mouth daily. Patient not taking: Reported on 10/31/2015 07/31/15   Jerrol Banana., MD    Patient Active Problem List   Diagnosis Date Noted  . Cerebrovascular accident (CVA) ([place]) 03/15/2015  . Pressure ulcer 10/22/2014  . Stress fracture of hip with delayed healing 10/19/2014  . Allergic rhinitis 09/20/2014  . Anxiety disorder 09/20/2014  . Absolute anemia 09/20/2014  . Back ache 09/20/2014  . Bipolar 1 disorder, depressed ([place]) 09/20/2014  . Body mass index of 60 or higher 09/20/2014  . Anemia due to blood loss 09/20/2014  . Aneurysm, cerebral 09/20/2014  . Chronic headache 09/20/2014  . Claustrophobia  09/20/2014  . Cerebral vascular accident ([place]) 09/20/2014  . Acute confusion due to medical condition 09/20/2014  . Clinical depression 09/20/2014  . Abnormal liver enzymes 09/20/2014  . 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 Diaphragmatic hernia 09/20/2014  . Gastric erosion 09/20/2014  . Enterogastritis 09/20/2014  . 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 Hypercholesteremia 09/20/2014  . Cannot sleep 09/20/2014  . Malaise and fatigue 09/20/2014  . Bad memory 09/20/2014  . Altered blood in stool 09/20/2014  . Candida glabrata infection 09/20/2014  . Decreased motor strength 09/20/2014  . Adiposity 09/20/2014  . Arthritis, degenerative 09/20/2014  . Excess weight 09/20/2014  .  Paralysis agitans ([place]) 09/20/2014  . Breakdown 09/20/2014  . PE (pulmonary embolism) 09/20/2014  . Hypercholesterolemia without hypertriglyceridemia 09/20/2014  . Restless leg 09/20/2014  . Neuralgia neuritis, sciatic nerve 09/20/2014  . Gastric ulcer 09/20/2014  . Fast heart beat 09/20/2014  . Temporary cerebral vascular dysfunction 09/20/2014  . Has a tremor 09/20/2014  . Adynamia 09/20/2014  . Classical migraine with intractable migraine 08/10/2013  . Dysphagia, unspecified(787.20) 08/05/2012  . Paraesophageal hernia 08/04/2012    Past Medical History:  Diagnosis Date  . Anemia   . Aneurysm ([place]) 2011  . Blood transfusion without reported diagnosis 2013,2014   x 2  . Cancer ([place])   . Depression   . Family history of adverse reaction to anesthesia    daughter nausea and vomiting  . Glaucoma   . Hernia   . Hyperlipidemia   . Pulmonary emboli ([place]) 2011  . Stroke [facility]) 2010    Social History   Social History  . Marital status: Married    Spouse name: [name]  . Number of children: 2  . Years of education: N/A   Occupational History  . Not on file.   Social History Main Topics  . Smoking status: Former Smoker    Years: 10.00    Types: Cigarettes    Quit date: 02/10/1989  . Smokeless tobacco: Never Used  . Alcohol use No  . Drug use: No  . Sexual activity: No   Other Topics Concern  . Not on file   Social History Narrative  . No narrative on file    Allergies  Allergen Reactions  . Diazepam     Other reaction(s): Feeling agitated (finding)    Review of Systems  Constitutional: Positive for malaise/fatigue.  HENT: Negative.   Eyes: Negative.   Respiratory: Negative.   Cardiovascular: Negative.   Gastrointestinal: Negative.   Genitourinary: Negative.   Musculoskeletal: Positive for myalgias.  Skin: Negative.   Neurological: Positive for weakness.  Endo/Heme/Allergies: Negative.   Psychiatric/Behavioral: Positive for depression.     Immunization History  Administered Date(s) Administered  . Influenza, High Dose Seasonal PF 11/09/2014, 10/31/2015    Objective:  BP 130/80 (BP Location: Right Arm, Patient Position: Sitting, Cuff Size: Large)   Pulse 88   Temp 97.8 F (36.6 C) (Oral)   Resp 16   Wt 218 lb (98.9 kg)   BMI 44.03 kg/m   Physical Exam  Constitutional: She is well-developed, well-nourished, and in no distress.  HENT:  Head: Normocephalic and atraumatic.  Eyes: Conjunctivae and EOM are normal. Pupils are equal, round, and reactive to light.  Neck: Normal range of motion. Neck supple.  Cardiovascular: Normal rate, regular rhythm, normal heart sounds and intact distal pulses.   Pulmonary/Chest: Effort normal and breath sounds normal.  Abdominal: Soft. There is no tenderness.  Musculoskeletal: Normal range of motion.  She has some  bruising of her right buttocks and back. Otherwise the fall resulted in no significant injuries.  Neurological: She is alert.  Chronic rest tremor of the hands especially.  Skin: Skin is warm and dry.  Psychiatric: Memory and affect normal.    Lab Results  Component Value Date   WBC 6.4 07/31/2015   HGB 11.7 (L) 10/22/2014   HCT 42.5 07/31/2015   PLT 250 07/31/2015   GLUCOSE 83 07/31/2015   CHOL 283 (H) 07/31/2015   TRIG 222 (H) 07/31/2015   HDL 72 07/31/2015   LDLCALC 167 (H) 07/31/2015   TSH 1.120 07/31/2015   INR 0.96 10/13/2014   HGBA1C 5.3 07/31/2015    CMP     Component Value Date/Time   NA 142 07/31/2015 1217   NA 139 10/13/2012 1345   K 4.6 07/31/2015 1217   K 3.6 10/13/2012 1345   CL 102 07/31/2015 1217   CL 107 10/13/2012 1345   CO2 23 07/31/2015 1217   CO2 27 10/13/2012 1345   GLUCOSE 83 07/31/2015 1217   GLUCOSE 101 (H) 10/21/2014 0444   GLUCOSE 80 10/13/2012 1345   BUN 16 07/31/2015 1217   BUN 18 10/13/2012 1345   CREATININE 0.77 07/31/2015 1217   CREATININE 0.73 10/13/2012 1345   CALCIUM 9.3 07/31/2015 1217   CALCIUM 9.0  10/13/2012 1345   PROT 7.5 07/31/2015 1217   PROT 7.7 06/02/2012 1327   ALBUMIN 4.3 07/31/2015 1217   ALBUMIN 3.7 06/02/2012 1327   AST 20 07/31/2015 1217   AST 15 06/02/2012 1327   ALT 23 07/31/2015 1217   ALT 22 06/02/2012 1327   ALKPHOS 99 07/31/2015 1217   ALKPHOS 111 06/02/2012 1327   BILITOT 0.3 07/31/2015 1217   BILITOT 0.2 06/02/2012 1327   GFRNONAA 79 07/31/2015 1217   GFRNONAA >60 10/13/2012 1345   GFRAA 91 07/31/2015 1217   GFRAA >60 10/13/2012 1345    Assessment and Plan :  1. Acute vaginitis May after repeat pelvic exam and wet prep on next visit if this persists. - metroNIDAZOLE (FLAGYL) 250 MG tablet; Take 1 tablet (250 mg total) by mouth 2 (two) times daily.  Dispense: 10 tablet; Refill: 0  2. Recurrent major depressive disorder, remission status unspecified (HCC)  - amphetamine-dextroamphetamine (ADDERALL) 10 MG tablet; Take 1 tablet (10 mg total) by mouth 2 (two) times daily.  Dispense: 60 tablet; Refill: 0  3. Class 2 obesity in adult, unspecified BMI, unspecified obesity type, unspecified whether serious comorbidity present Will try Adderall as a stimulant. Follow-up weight on next visit. 4. Bipolar depression As above, try Adderall. Hopefully this will help her depression was seen to be a little worse today. 5. Recent fall with contusions HPI, Exam, and A&P Transcribed under the direction and in the presence of Richard L. Cranford Mon, MD  Electronically Signed: Webb Laws, [place] MD [facility] 01/10/2016 3:44 PM

## 2016-01-24 ENCOUNTER — Encounter: Payer: Self-pay | Admitting: Family Medicine

## 2016-01-24 ENCOUNTER — Ambulatory Visit (INDEPENDENT_AMBULATORY_CARE_PROVIDER_SITE_OTHER): Payer: PPO | Admitting: Family Medicine

## 2016-01-24 VITALS — BP 116/84 | HR 98 | Temp 98.1°F | Resp 16 | Wt 221.2 lb

## 2016-01-24 DIAGNOSIS — F319 Bipolar disorder, unspecified: Secondary | ICD-10-CM | POA: Diagnosis not present

## 2016-01-24 DIAGNOSIS — F339 Major depressive disorder, recurrent, unspecified: Secondary | ICD-10-CM

## 2016-01-24 DIAGNOSIS — E669 Obesity, unspecified: Secondary | ICD-10-CM | POA: Diagnosis not present

## 2016-01-24 MED ORDER — AMPHETAMINE-DEXTROAMPHETAMINE 10 MG PO TABS: 10.0000 mg | ORAL_TABLET | Freq: Two times a day (BID) | ORAL | 0 refills | Status: DC

## 2016-01-24 NOTE — Progress Notes (Signed)
Patient: Gail Hardy Female    DOB: 17-Sep-1945   70 y.o.   MRN: [id number] Visit Date: 01/24/2016  Today's Provider: Wilhemena Durie, MD   Chief Complaint  Patient presents with  . Depression  . Obesity  . Follow-up   Subjective:    HPI  Patient is here for a 2 week follow up of depression and obesity. Patient started Adderall on 01/10/2016. She reports symptoms have improved during the day, but she has breakthrough symptoms of hunger at night. She has started drinking slim fast to help with the weight loss. Patient's weight is 221.2lb today.  Wt Readings from Last 3 Encounters:  01/24/16 221 lb 3.2 oz (100.3 kg)  01/10/16 218 lb (98.9 kg)  10/31/15 221 lb (100.2 kg)     Previous Medications   ARIPIPRAZOLE (ABILIFY) 30 MG TABLET    TAKE 1 TABLET BY MOUTH ONCE A DAY   BUPROPION (WELLBUTRIN SR) 150 MG 12 HR TABLET    TAKE 1 TABLET BY MOUTH ONCE A DAY   CITALOPRAM (CELEXA) 40 MG TABLET    TAKE 1 TABLET BY MOUTH ONCE A DAY   CLONAZEPAM (KLONOPIN) 1 MG TABLET    Take 1 tablet (1 mg total) by mouth every 8 (eight) hours as needed. for anxiety   DIPHENOXYLATE-ATROPINE (LOMOTIL) 2.5-0.025 MG TABLET    Take 1 tablet by mouth 4 (four) times daily as needed for diarrhea or loose stools.   GABAPENTIN (NEURONTIN) 400 MG CAPSULE    TAKE 1 CAPSULE BY MOUTH 3 TIMES A DAY   HYDROCODONE-ACETAMINOPHEN (NORCO) 5-325 MG TABLET    Take 1 tablet by mouth every 4 (four) hours as needed for moderate pain.   LATANOPROST (XALATAN) 0.005 % OPHTHALMIC SOLUTION    Place 1 drop into both eyes at bedtime.    MELOXICAM (MOBIC) 15 MG TABLET       METRONIDAZOLE (FLAGYL) 250 MG TABLET    Take 1 tablet (250 mg total) by mouth 2 (two) times daily.   PHENTERMINE (ADIPEX-P) 37.5 MG TABLET    Take 1 tablet (37.5 mg total) by mouth daily.   PROMETHAZINE (PHENERGAN) 25 MG TABLET    TAKE 1/2 TO 1 TABLET BY MOUTH EVERY 6 HOURS AS NEEDED   TEMAZEPAM (RESTORIL) 30 MG CAPSULE    Take 1 capsule (30 mg total) by mouth  daily.    Review of Systems  Constitutional: Negative.   Eyes: Negative.   Respiratory: Negative.   Cardiovascular: Negative.   Endocrine: Negative.   Genitourinary: Negative.   Allergic/Immunologic: Negative.   Hematological: Negative.   Psychiatric/Behavioral: Positive for dysphoric mood.    Social History  Substance Use Topics  . Smoking status: Former Smoker    Years: 10.00    Types: Cigarettes    Quit date: 02/10/1989  . Smokeless tobacco: Never Used  . Alcohol use No   Objective:   BP 116/84 (BP Location: Right Arm, Patient Position: Sitting, Cuff Size: Normal)   Pulse 98   Temp 98.1 F (36.7 C) (Oral)   Resp 16   Wt 221 lb 3.2 oz (100.3 kg)   BMI 44.68 kg/m   Physical Exam  Constitutional: She is oriented to person, place, and time. She appears well-developed and well-nourished.  HENT:  Head: Normocephalic and atraumatic.  Right Ear: External ear normal.  Left Ear: External ear normal.  Mouth/Throat: Oropharynx is clear and moist.  Eyes: Conjunctivae and EOM are normal. Pupils are equal, round, and reactive to light.  Neck:  Normal range of motion. Neck supple.  Cardiovascular: Normal rate, regular rhythm, normal heart sounds and intact distal pulses.   Pulmonary/Chest: Effort normal and breath sounds normal.  Abdominal: Soft. Bowel sounds are normal.  Musculoskeletal: Normal range of motion.  Neurological: She is alert and oriented to person, place, and time. She has normal reflexes.  Skin: Skin is warm and dry.  Psychiatric: She has a normal mood and affect. Her behavior is normal. Judgment and thought content normal.        Assessment & Plan:     1. Bipolar 1 disorder, depressed (HCC) Symptoms stable. Continue Adderall BID. Take first tablet at 10 am and second tablet at 4 pm. Gave patient 3 RX for Adderall.   2. Class 2 obesity in adult, unspecified BMI, unspecified obesity type, unspecified whether serious comorbidity present Symptoms stable  during the day and breakthrough symptoms of hunger. Change time of day taking medication to first tablet at 10 am and second tablet at 4 pm. Gave patient 3 RX for Adderall.  Try to fast 1 day per week- no calorie intake only fluids.   Follow up: Return in about 3 months (around 04/23/2016).  I have done the exam and reviewed the chart and it is accurate to the best of my knowledge. Development worker, community has been used and  any errors in dictation or transcription are unintentional. Miguel Aschoff M.D. [facility]

## 2016-02-07 ENCOUNTER — Other Ambulatory Visit: Payer: Self-pay | Admitting: Family Medicine

## 2016-02-07 NOTE — Telephone Encounter (Signed)
Last ov 01/24/16, last filled 07/31/15. Please review. Thank you. sd

## 2016-02-07 NOTE — Telephone Encounter (Signed)
Called into [facility]

## 2016-03-04 ENCOUNTER — Other Ambulatory Visit: Payer: Self-pay | Admitting: Family Medicine

## 2016-03-04 NOTE — Telephone Encounter (Signed)
LOV 01/24/2016. Renaldo Fiddler, CMA

## 2016-04-01 ENCOUNTER — Other Ambulatory Visit: Payer: Self-pay | Admitting: Family Medicine

## 2016-04-01 DIAGNOSIS — H401121 Primary open-angle glaucoma, left eye, mild stage: Secondary | ICD-10-CM | POA: Diagnosis not present

## 2016-04-01 NOTE — Telephone Encounter (Signed)
Pt advised-aa 

## 2016-04-01 NOTE — Telephone Encounter (Signed)
Patient called and states she needs her Adderall refilled. Her next f/u with Dr Rosanna Randy is in march but states she wont have enough medication to last till then. Patient is going to eye doctor at 1:30 and wanted to try and get this RX at that time also so she does not have to come out again. I advised patient I can see  If someone can get this filled for Dr Rosanna Randy since he is not here. Please review-aa

## 2016-04-01 NOTE — Telephone Encounter (Signed)
Rx printed x 1

## 2016-04-24 ENCOUNTER — Ambulatory Visit (INDEPENDENT_AMBULATORY_CARE_PROVIDER_SITE_OTHER): Payer: PPO | Admitting: Family Medicine

## 2016-04-24 VITALS — BP 108/82 | HR 112 | Temp 97.8°F | Resp 16 | Wt 213.0 lb

## 2016-04-24 DIAGNOSIS — F418 Other specified anxiety disorders: Secondary | ICD-10-CM | POA: Diagnosis not present

## 2016-04-24 DIAGNOSIS — D500 Iron deficiency anemia secondary to blood loss (chronic): Secondary | ICD-10-CM | POA: Diagnosis not present

## 2016-04-24 DIAGNOSIS — F339 Major depressive disorder, recurrent, unspecified: Secondary | ICD-10-CM | POA: Diagnosis not present

## 2016-04-24 DIAGNOSIS — E669 Obesity, unspecified: Secondary | ICD-10-CM

## 2016-04-24 DIAGNOSIS — M7062 Trochanteric bursitis, left hip: Secondary | ICD-10-CM | POA: Diagnosis not present

## 2016-04-24 DIAGNOSIS — E7800 Pure hypercholesterolemia, unspecified: Secondary | ICD-10-CM

## 2016-04-24 MED ORDER — AMPHETAMINE-DEXTROAMPHETAMINE 10 MG PO TABS: 10.0000 mg | ORAL_TABLET | Freq: Two times a day (BID) | ORAL | 0 refills | Status: DC

## 2016-04-24 MED ORDER — MELOXICAM 7.5 MG PO TABS: 7.5000 mg | ORAL_TABLET | Freq: Two times a day (BID) | ORAL | 0 refills | Status: DC | PRN

## 2016-04-24 NOTE — Progress Notes (Signed)
Gail Hardy  MRN: [id number] DOB: 24-Dec-1945  Subjective:  HPI  Patient is here for 3 months follow up. LOV was 01/24/16. Last labs were done on 07/31/15-stable. Patient is taking Adderall 2 tablets daily and has improved the things she is eating.  She has lost 9 lbs since then and more per her scale at home. Wt Readings from Last 3 Encounters:  04/24/16 213 lb (96.6 kg)  01/24/16 221 lb 3.2 oz (100.3 kg)  01/10/16 218 lb (98.9 kg)   PHQ9 updated today and score is 18. Patient states she just wants to stay in her room and not wanting to do much. She is taking Abilify, Wellbutrin, Celexa. Klonopin on her list but patient states she is not taking a nerve pill like that abut would like to try something. Depression screen [facility] 2/9 04/24/2016 07/31/2015 03/15/2015  Decreased Interest 2 0 3  Down, Depressed, Hopeless 2 3 3   PHQ - 2 Score 4 3 6   Altered sleeping 3 3 1   Tired, decreased energy 3 2 3   Change in appetite 2 3 3   Feeling bad or failure about yourself  3 3 3   Trouble concentrating 0 0 1  Moving slowly or fidgety/restless 0 0 1  Suicidal thoughts 3 0 1  PHQ-9 Score 18 14 19   Difficult doing work/chores - Very difficult Somewhat difficult   She is taking Temazepam at night and is able to sleep with taking medication. Patient is having issues with hip. Pain present off and on and would like to discuss trying to take something for it.  Patient Active Problem List   Diagnosis Date Noted  . Cerebrovascular accident (CVA) ([place]) 03/15/2015  . Pressure ulcer 10/22/2014  . Stress fracture of hip with delayed healing 10/19/2014  . Allergic rhinitis 09/20/2014  . Anxiety disorder 09/20/2014  . Absolute anemia 09/20/2014  . Back ache 09/20/2014  . Bipolar 1 disorder, depressed ([place]) 09/20/2014  . Body mass index of 60 or higher 09/20/2014  . Anemia due to blood loss 09/20/2014  . Aneurysm, cerebral 09/20/2014  . Chronic headache 09/20/2014  . Claustrophobia 09/20/2014  .  Cerebral vascular accident ([place]) 09/20/2014  . Acute confusion due to medical condition 09/20/2014  . Clinical depression 09/20/2014  . Abnormal liver enzymes 09/20/2014  . 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 Diaphragmatic hernia 09/20/2014  . Gastric erosion 09/20/2014  . Enterogastritis 09/20/2014  . 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 Hypercholesteremia 09/20/2014  . Cannot sleep 09/20/2014  . Malaise and fatigue 09/20/2014  . Bad memory 09/20/2014  . Altered blood in stool 09/20/2014  . Candida glabrata infection 09/20/2014  . Decreased motor strength 09/20/2014  . Adiposity 09/20/2014  . Arthritis, degenerative 09/20/2014  . Excess weight 09/20/2014  . Paralysis agitans ([place]) 09/20/2014  . Breakdown 09/20/2014  . PE (pulmonary embolism) 09/20/2014  . Hypercholesterolemia without hypertriglyceridemia 09/20/2014  . Restless leg 09/20/2014  . Neuralgia neuritis, sciatic nerve 09/20/2014  . Gastric ulcer 09/20/2014  . Fast heart beat 09/20/2014  . Temporary cerebral vascular dysfunction 09/20/2014  . Has a tremor 09/20/2014  . Adynamia 09/20/2014  . Classical migraine with intractable migraine 08/10/2013  . Dysphagia, unspecified(787.20) 08/05/2012  . Paraesophageal hernia 08/04/2012    Past Medical History:  Diagnosis Date  . Anemia   . Aneurysm ([place]) 2011  . Blood transfusion without reported diagnosis 2013,2014   x 2  . Cancer ([place])   . Depression   . Family history of adverse reaction to anesthesia  daughter nausea and vomiting  . Glaucoma   . Hernia   . Hyperlipidemia   . Pulmonary emboli ([place]) 2011  . Stroke [facility]) 2010    Social History   Social History  . Marital status: Married    Spouse name: [name]  . Number of children: 2  . Years of education: N/A   Occupational History  . Not on file.   Social History Main Topics  . Smoking status: Former Smoker    Years: 10.00    Types: Cigarettes    Quit date: 02/10/1989  . Smokeless  tobacco: Never Used  . Alcohol use No  . Drug use: No  . Sexual activity: No   Other Topics Concern  . Not on file   Social History Narrative  . No narrative on file    Outpatient Encounter Prescriptions as of 04/24/2016  Medication Sig Note  . amphetamine-dextroamphetamine (ADDERALL) 10 MG tablet TAKE 1 TABLET BY MOUTH TWICE A DAY   . ARIPiprazole (ABILIFY) 30 MG tablet TAKE 1 TABLET BY MOUTH ONCE A DAY   . buPROPion (WELLBUTRIN SR) 150 MG 12 hr tablet TAKE 1 TABLET BY MOUTH ONCE DAILY   . citalopram (CELEXA) 40 MG tablet TAKE 1 TABLET BY MOUTH ONCE A DAY   . gabapentin (NEURONTIN) 400 MG capsule TAKE 1 CAPSULE BY MOUTH 3 TIMES A DAY   . latanoprost (XALATAN) 0.005 % ophthalmic solution Place 1 drop into both eyes at bedtime.    . temazepam (RESTORIL) 30 MG capsule TAKE 1 CAPSULE BY MOUTH ONCE DAILY   . [DISCONTINUED] clonazePAM (KLONOPIN) 1 MG tablet TAKE 1 TABLET BY MOUTH EVERY 8 HOURS AS NEEDED FOR ANXIETY   . [DISCONTINUED] diphenoxylate-atropine (LOMOTIL) 2.5-0.025 MG tablet Take 1 tablet by mouth 4 (four) times daily as needed for diarrhea or loose stools.   . [DISCONTINUED] HYDROcodone-acetaminophen (NORCO) 5-325 MG tablet Take 1 tablet by mouth every 4 (four) hours as needed for moderate pain.   . [DISCONTINUED] meloxicam (MOBIC) 15 MG tablet  10/31/2015: Received from: External Pharmacy  . [DISCONTINUED] metroNIDAZOLE (FLAGYL) 250 MG tablet Take 1 tablet (250 mg total) by mouth 2 (two) times daily.   . [DISCONTINUED] phentermine (ADIPEX-P) 37.5 MG tablet Take 1 tablet (37.5 mg total) by mouth daily.   . [DISCONTINUED] promethazine (PHENERGAN) 25 MG tablet TAKE 1/2 TO 1 TABLET BY MOUTH EVERY 6 HOURS AS NEEDED    No facility-administered encounter medications on file as of 04/24/2016.     Allergies  Allergen Reactions  . Diazepam     Other reaction(s): Feeling agitated (finding)    Review of Systems  Constitutional: Positive for malaise/fatigue.  Respiratory: Negative.     Cardiovascular: Negative.   Musculoskeletal: Positive for back pain, joint pain and myalgias.  Neurological: Positive for tingling, tremors and weakness.  Psychiatric/Behavioral: Positive for depression. Negative for hallucinations, substance abuse and suicidal ideas. The patient is nervous/anxious and has insomnia.     Objective:  BP 108/82   Pulse (!) 112   Temp 97.8 F (36.6 C)   Resp 16   Wt 213 lb (96.6 kg)   BMI 43.02 kg/m   Physical Exam  Constitutional: She is well-developed, well-nourished, and in no distress.  HENT:  Head: Normocephalic and atraumatic.  Eyes: Conjunctivae are normal. Pupils are equal, round, and reactive to light.  Neck: Normal range of motion. Neck supple.  Cardiovascular: Normal rate, regular rhythm, normal heart sounds and intact distal pulses.   No murmur heard. Pulmonary/Chest: Effort normal  and breath sounds normal. No respiratory distress. She has no wheezes.  Musculoskeletal:  Tender over the left great trochanteric area  Neurological: She is alert.    Assessment and Plan :  1. Class 2 obesity in adult, unspecified BMI, unspecified obesity type, unspecified whether serious comorbidity present Weight is better. Patient is taking Adderall daily. Refills given.  2. Recurrent major depressive disorder, remission status unspecified (HCC)/Bipolar Worse. PHQ 9 score 18 worse from last year in 2017. Patient states she is not having any suicidal thoughts or plan. Patient just feels like she would be better off dead sometimes.  Adderall seems to also help depression. 3. Trochanteric bursitis of left hip I think this is an issue. Advised patient to continue doing exercises at home and walking. Try Meloxicam but only for about 2 1/2 weeks due to possible side effects of long term use with this Medication.  4. Anemia due to blood loss Check labs today.  5. Other specified anxiety disorders Worsening. Discussed this in details. Patient is maxed out on  current medications that should help anxiety. If this does not get better discussed with patient referral to psychiatrist will be beneficial at that time.  6. Hypercholesteremia Check labs today. HPI, Exam and A&P transcribed under direction and in the presence of Miguel Aschoff, MD. I have done the exam and reviewed the chart and it is accurate to the best of my knowledge. Development worker, community has been used and  any errors in dictation or transcription are unintentional. Miguel Aschoff M.D. [facility]

## 2016-04-25 LAB — CBC WITH DIFFERENTIAL/PLATELET
BASOS: 0 %
Basophils Absolute: 0 10*3/uL (ref 0.0–0.2)
EOS (ABSOLUTE): 0.2 10*3/uL (ref 0.0–0.4)
EOS: 2 %
HEMATOCRIT: 45.3 % (ref 34.0–46.6)
HEMOGLOBIN: 15.8 g/dL (ref 11.1–15.9)
Immature Grans (Abs): 0 10*3/uL (ref 0.0–0.1)
Immature Granulocytes: 0 %
LYMPHS ABS: 3.3 10*3/uL — AB (ref 0.7–3.1)
Lymphs: 45 %
MCH: 30.2 pg (ref 26.6–33.0)
MCHC: 34.9 g/dL (ref 31.5–35.7)
MCV: 87 fL (ref 79–97)
MONOCYTES: 10 %
Monocytes Absolute: 0.7 10*3/uL (ref 0.1–0.9)
NEUTROS ABS: 3.2 10*3/uL (ref 1.4–7.0)
Neutrophils: 43 %
Platelets: 253 10*3/uL (ref 150–379)
RBC: 5.24 x10E6/uL (ref 3.77–5.28)
RDW: 15.1 % (ref 12.3–15.4)
WBC: 7.5 10*3/uL (ref 3.4–10.8)

## 2016-04-25 LAB — COMPREHENSIVE METABOLIC PANEL
ALK PHOS: 97 IU/L (ref 39–117)
ALT: 22 IU/L (ref 0–32)
AST: 24 IU/L (ref 0–40)
Albumin/Globulin Ratio: 1.6 (ref 1.2–2.2)
Albumin: 4.7 g/dL (ref 3.5–4.8)
BUN/Creatinine Ratio: 22 (ref 12–28)
BUN: 19 mg/dL (ref 8–27)
Bilirubin Total: 0.5 mg/dL (ref 0.0–1.2)
CALCIUM: 9.5 mg/dL (ref 8.7–10.3)
CO2: 25 mmol/L (ref 18–29)
CREATININE: 0.88 mg/dL (ref 0.57–1.00)
Chloride: 101 mmol/L (ref 96–106)
GFR calc Af Amer: 77 mL/min/{1.73_m2} (ref >59)
GFR, EST NON AFRICAN AMERICAN: 67 mL/min/{1.73_m2} (ref >59)
GLOBULIN, TOTAL: 2.9 g/dL (ref 1.5–4.5)
GLUCOSE: 95 mg/dL (ref 65–99)
Potassium: 4.2 mmol/L (ref 3.5–5.2)
SODIUM: 142 mmol/L (ref 134–144)
Total Protein: 7.6 g/dL (ref 6.0–8.5)

## 2016-04-25 LAB — LIPID PANEL WITH LDL/HDL RATIO
Cholesterol, Total: 260 mg/dL — ABNORMAL HIGH (ref 100–199)
HDL: 87 mg/dL (ref >39)
LDL Calculated: 154 mg/dL — ABNORMAL HIGH (ref 0–99)
LDL/HDL RATIO: 1.8 ratio (ref 0.0–3.2)
TRIGLYCERIDES: 97 mg/dL (ref 0–149)
VLDL Cholesterol Cal: 19 mg/dL (ref 5–40)

## 2016-04-25 LAB — TSH: TSH: 1.59 u[IU]/mL (ref 0.450–4.500)

## 2016-05-27 ENCOUNTER — Other Ambulatory Visit: Payer: Self-pay | Admitting: Family Medicine

## 2016-05-28 DIAGNOSIS — S7002XD Contusion of left hip, subsequent encounter: Secondary | ICD-10-CM | POA: Diagnosis not present

## 2016-05-28 DIAGNOSIS — M25552 Pain in left hip: Secondary | ICD-10-CM | POA: Diagnosis not present

## 2016-07-25 ENCOUNTER — Encounter: Payer: Self-pay | Admitting: Family Medicine

## 2016-07-25 ENCOUNTER — Ambulatory Visit (INDEPENDENT_AMBULATORY_CARE_PROVIDER_SITE_OTHER): Payer: PPO | Admitting: Family Medicine

## 2016-07-25 VITALS — BP 128/76 | HR 100 | Resp 16 | Wt 210.0 lb

## 2016-07-25 DIAGNOSIS — Z6841 Body Mass Index (BMI) 40.0 and over, adult: Secondary | ICD-10-CM | POA: Diagnosis not present

## 2016-07-25 DIAGNOSIS — E669 Obesity, unspecified: Secondary | ICD-10-CM

## 2016-07-25 DIAGNOSIS — IMO0001 Reserved for inherently not codable concepts without codable children: Secondary | ICD-10-CM

## 2016-07-25 DIAGNOSIS — M1610 Unilateral primary osteoarthritis, unspecified hip: Secondary | ICD-10-CM | POA: Diagnosis not present

## 2016-07-25 MED ORDER — TEMAZEPAM 30 MG PO CAPS
30.0000 mg | ORAL_CAPSULE | Freq: Every day | ORAL | 5 refills | Status: DC
Start: 2016-07-25 — End: 2017-01-30

## 2016-07-25 MED ORDER — AMPHETAMINE-DEXTROAMPHETAMINE 10 MG PO TABS: 10.0000 mg | ORAL_TABLET | Freq: Two times a day (BID) | ORAL | 0 refills | Status: DC

## 2016-07-25 NOTE — Progress Notes (Signed)
Patient: Gail Hardy Female    DOB: Dec 11, 1945   71 y.o.   MRN: [id number] Visit Date: 07/25/2016  Today's Provider: Wilhemena Durie, MD   Chief Complaint  Patient presents with  . Depression  . Anxiety  . Hip Pain   Subjective:    HPI Patient comes in today for a follow up. Patient was last seen in the office about 3 months ago. She scored a 18 on her PHQ9 on the last visit. She states that she feels much better emotionally. She states that her only issue is her hip pain. On the last visit she was treated with Meloxicam and it did not help.      Allergies  Allergen Reactions  . Diazepam     Other reaction(s): Feeling agitated (finding)     Current Outpatient Prescriptions:  .  amphetamine-dextroamphetamine (ADDERALL) 10 MG tablet, Take 1 tablet (10 mg total) by mouth 2 (two) times daily., Disp: 60 tablet, Rfl: 0 .  ARIPiprazole (ABILIFY) 30 MG tablet, TAKE 1 TABLET BY MOUTH ONCE A DAY, Disp: 30 tablet, Rfl: 5 .  buPROPion (WELLBUTRIN SR) 150 MG 12 hr tablet, TAKE 1 TABLET BY MOUTH ONCE DAILY, Disp: 30 tablet, Rfl: 5 .  citalopram (CELEXA) 40 MG tablet, TAKE 1 TABLET BY MOUTH ONCE A DAY, Disp: 30 tablet, Rfl: 11 .  gabapentin (NEURONTIN) 400 MG capsule, TAKE 1 CAPSULE BY MOUTH 3 TIMES A DAY, Disp: 270 capsule, Rfl: 3 .  latanoprost (XALATAN) 0.005 % ophthalmic solution, Place 1 drop into both eyes at bedtime. , Disp: , Rfl:  .  temazepam (RESTORIL) 30 MG capsule, TAKE 1 CAPSULE BY MOUTH ONCE DAILY, Disp: 30 capsule, Rfl: 5 .  amphetamine-dextroamphetamine (ADDERALL) 10 MG tablet, Take 1 tablet (10 mg total) by mouth 2 (two) times daily., Disp: 60 tablet, Rfl: 0 .  meloxicam (MOBIC) 7.5 MG tablet, Take 1 tablet (7.5 mg total) by mouth 2 (two) times daily as needed for pain. (Patient not taking: Reported on 07/25/2016), Disp: 60 tablet, Rfl: 0  Review of Systems  Constitutional: Negative.   HENT: Negative.   Eyes: Negative.   Respiratory: Negative.     Cardiovascular: Negative.   Gastrointestinal: Negative.   Endocrine: Negative.   Genitourinary: Negative.   Musculoskeletal: Negative.   Allergic/Immunologic: Negative.   Neurological: Positive for tremors.  Hematological: Negative.     Social History  Substance Use Topics  . Smoking status: Former Smoker    Years: 10.00    Types: Cigarettes    Quit date: 02/10/1989  . Smokeless tobacco: Never Used  . Alcohol use No   Objective:   BP 128/76 (BP Location: Left Arm, Patient Position: Sitting, Cuff Size: Normal)   Pulse 100   Resp 16   Wt 210 lb (95.3 kg)   SpO2 95%   BMI 42.41 kg/m  Vitals:   07/25/16 1435  BP: 128/76  Pulse: 100  Resp: 16  SpO2: 95%  Weight: 210 lb (95.3 kg)     Physical Exam  Constitutional: She is oriented to person, place, and time. She appears well-developed and well-nourished.  HENT:  Head: Normocephalic and atraumatic.  Right Ear: External ear normal.  Left Ear: External ear normal.  Nose: Nose normal.  Eyes: Conjunctivae are normal. No scleral icterus.  Neck: No thyromegaly present.  Cardiovascular: Normal rate, regular rhythm and normal heart sounds.   Pulmonary/Chest: Effort normal and breath sounds normal.  Abdominal: Soft. Bowel sounds are normal.  Neurological: She is alert and oriented to person, place, and time.  Skin: Skin is warm and dry.  Psychiatric: She has a normal mood and affect. Her behavior is normal. Judgment and thought content normal.        Assessment & Plan:     Bipolar Depression Improved with PHQ9 better at 3 today. Obesity OA/Chronic  Back/hip pain. Pt requests narcotics--refused today. She needs to f/u with ortho.     I have done the exam and reviewed the above chart and it is accurate to the best of my knowledge. Development worker, community has been used in this note in any air is in the dictation or transcription are unintentional.  Wilhemena Durie, MD  [place]

## 2016-07-31 ENCOUNTER — Ambulatory Visit (INDEPENDENT_AMBULATORY_CARE_PROVIDER_SITE_OTHER): Payer: PPO

## 2016-07-31 ENCOUNTER — Other Ambulatory Visit: Payer: Self-pay | Admitting: Family Medicine

## 2016-07-31 VITALS — BP 104/72 | HR 88 | Temp 98.8°F | Ht 59.0 in | Wt 210.0 lb

## 2016-07-31 DIAGNOSIS — Z0000 Encounter for general adult medical examination without abnormal findings: Secondary | ICD-10-CM

## 2016-07-31 NOTE — Patient Instructions (Signed)
[name] , Thank you for taking time to come for your Medicare Wellness Visit. I appreciate your ongoing commitment to your health goals. Please review the following plan we discussed and let me know if I can assist you in the future.   Screening recommendations/referrals: Colonoscopy: completed in last 5 years per patient, declined new referral Mammogram: completed 03/22/15, due 03/2016 Bone Density: declined Recommended yearly ophthalmology/optometry visit for glaucoma screening and checkup Recommended yearly dental visit for hygiene and checkup  Vaccinations: Influenza vaccine: up to date, due 10/2016 Pneumococcal vaccine: declined Pneumovax 23 Tdap vaccine: declined Shingles vaccine: declined   Advanced directives: On file  Conditions/risks identified: Fall risk prevention; Recommend increasing water intake to 4 glasses a day.   Next appointment: 10/01/16 @ 1:45 PM   Preventive Care 65 Years and Older, Female Preventive care refers to lifestyle choices and visits with your health care provider that can promote health and wellness. What does preventive care include?  A yearly physical exam. This is also called an annual well check.  Dental exams once or twice a year.  Routine eye exams. Ask your health care provider how often you should have your eyes checked.  Personal lifestyle choices, including:  Daily care of your teeth and gums.  Regular physical activity.  Eating a healthy diet.  Avoiding tobacco and drug use.  Limiting alcohol use.  Practicing safe sex.  Taking low-dose aspirin every day.  Taking vitamin and mineral supplements as recommended by your health care provider. What happens during an annual well check? The services and screenings done by your health care provider during your annual well check will depend on your age, overall health, lifestyle risk factors, and family history of disease. Counseling  Your health care provider may ask you  questions about your:  Alcohol use.  Tobacco use.  Drug use.  Emotional well-being.  Home and relationship well-being.  Sexual activity.  Eating habits.  History of falls.  Memory and ability to understand (cognition).  Work and work Statistician.  Reproductive health. Screening  You may have the following tests or measurements:  Height, weight, and BMI.  Blood pressure.  Lipid and cholesterol levels. These may be checked every 5 years, or more frequently if you are over 22 years old.  Skin check.  Lung cancer screening. You may have this screening every year starting at age 80 if you have a 30-pack-year history of smoking and currently smoke or have quit within the past 15 years.  Fecal occult blood test (FOBT) of the stool. You may have this test every year starting at age 97.  Flexible sigmoidoscopy or colonoscopy. You may have a sigmoidoscopy every 5 years or a colonoscopy every 10 years starting at age 38.  Hepatitis C blood test.  Hepatitis B blood test.  Sexually transmitted disease (STD) testing.  Diabetes screening. This is done by checking your blood sugar (glucose) after you have not eaten for a while (fasting). You may have this done every 1-3 years.  Bone density scan. This is done to screen for osteoporosis. You may have this done starting at age 48.  Mammogram. This may be done every 1-2 years. Talk to your health care provider about how often you should have regular mammograms. Talk with your health care provider about your test results, treatment options, and if necessary, the need for more tests. Vaccines  Your health care provider may recommend certain vaccines, such as:  Influenza vaccine. This is recommended every year.  Tetanus,  diphtheria, and acellular pertussis (Tdap, Td) vaccine. You may need a Td booster every 10 years.  Zoster vaccine. You may need this after age 32.  Pneumococcal 13-valent conjugate (PCV13) vaccine. One dose is  recommended after age 83.  Pneumococcal polysaccharide (PPSV23) vaccine. One dose is recommended after age 62. Talk to your health care provider about which screenings and vaccines you need and how often you need them. This information is not intended to replace advice given to you by your health care provider. Make sure you discuss any questions you have with your health care provider. Document Released: 02/24/2015 Document Revised: 10/18/2015 Document Reviewed: 11/29/2014 Elsevier Interactive Patient Education  2017 [place] Prevention in the Home Falls can cause injuries. They can happen to people of all ages. There are many things you can do to make your home safe and to help prevent falls. What can I do on the outside of my home?  Regularly fix the edges of walkways and driveways and fix any cracks.  Remove anything that might make you trip as you walk through a door, such as a raised step or threshold.  Trim any bushes or trees on the path to your home.  Use bright outdoor lighting.  Clear any walking paths of anything that might make someone trip, such as rocks or tools.  Regularly check to see if handrails are loose or broken. Make sure that both sides of any steps have handrails.  Any raised decks and porches should have guardrails on the edges.  Have any leaves, snow, or ice cleared regularly.  Use sand or salt on walking paths during winter.  Clean up any spills in your garage right away. This includes oil or grease spills. What can I do in the bathroom?  Use night lights.  Install grab bars by the toilet and in the tub and shower. Do not use towel bars as grab bars.  Use non-skid mats or decals in the tub or shower.  If you need to sit down in the shower, use a plastic, non-slip stool.  Keep the floor dry. Clean up any water that spills on the floor as soon as it happens.  Remove soap buildup in the tub or shower regularly.  Attach bath mats  securely with double-sided non-slip rug tape.  Do not have throw rugs and other things on the floor that can make you trip. What can I do in the bedroom?  Use night lights.  Make sure that you have a light by your bed that is easy to reach.  Do not use any sheets or blankets that are too big for your bed. They should not hang down onto the floor.  Have a firm chair that has side arms. You can use this for support while you get dressed.  Do not have throw rugs and other things on the floor that can make you trip. What can I do in the kitchen?  Clean up any spills right away.  Avoid walking on wet floors.  Keep items that you use a lot in easy-to-reach places.  If you need to reach something above you, use a strong step stool that has a grab bar.  Keep electrical cords out of the way.  Do not use floor polish or wax that makes floors slippery. If you must use wax, use non-skid floor wax.  Do not have throw rugs and other things on the floor that can make you trip. What can I do with  my stairs?  Do not leave any items on the stairs.  Make sure that there are handrails on both sides of the stairs and use them. Fix handrails that are broken or loose. Make sure that handrails are as long as the stairways.  Check any carpeting to make sure that it is firmly attached to the stairs. Fix any carpet that is loose or worn.  Avoid having throw rugs at the top or bottom of the stairs. If you do have throw rugs, attach them to the floor with carpet tape.  Make sure that you have a light switch at the top of the stairs and the bottom of the stairs. If you do not have them, ask someone to add them for you. What else can I do to help prevent falls?  Wear shoes that:  Do not have high heels.  Have rubber bottoms.  Are comfortable and fit you well.  Are closed at the toe. Do not wear sandals.  If you use a stepladder:  Make sure that it is fully opened. Do not climb a closed  stepladder.  Make sure that both sides of the stepladder are locked into place.  Ask someone to hold it for you, if possible.  Clearly mark and make sure that you can see:  Any grab bars or handrails.  First and last steps.  Where the edge of each step is.  Use tools that help you move around (mobility aids) if they are needed. These include:  Canes.  Walkers.  Scooters.  Crutches.  Turn on the lights when you go into a dark area. Replace any light bulbs as soon as they burn out.  Set up your furniture so you have a clear path. Avoid moving your furniture around.  If any of your floors are uneven, fix them.  If there are any pets around you, be aware of where they are.  Review your medicines with your doctor. Some medicines can make you feel dizzy. This can increase your chance of falling. Ask your doctor what other things that you can do to help prevent falls. This information is not intended to replace advice given to you by your health care provider. Make sure you discuss any questions you have with your health care provider. Document Released: 11/24/2008 Document Revised: 07/06/2015 Document Reviewed: 03/04/2014 Elsevier Interactive Patient Education  2017 [employer].

## 2016-07-31 NOTE — Progress Notes (Signed)
Subjective:   Gail Hardy is a 71 y.o. female who presents for Medicare Annual (Subsequent) preventive examination.  Review of Systems:  N/A  Cardiac Risk Factors include: advanced age (>87men, >65 women);obesity (BMI >30kg/m2);dyslipidemia     Objective:     Vitals: BP 104/72 (BP Location: Right Arm)   Pulse 88   Temp 98.8 F (37.1 C) (Oral)   Ht 4\' 11"  (1.499 m)   Wt 210 lb (95.3 kg)   BMI 42.41 kg/m   Body mass index is 42.41 kg/m.   Tobacco History  Smoking Status  . Former Smoker  . Years: 10.00  . Types: Cigarettes  . Quit date: 02/10/1989  Smokeless Tobacco  . Never Used     Counseling given: Not Answered   Past Medical History:  Diagnosis Date  . Anemia   . Aneurysm ([place]) 2011  . Blood transfusion without reported diagnosis 2013,2014   x 2  . Cancer ([place])   . Depression   . Family history of adverse reaction to anesthesia    daughter nausea and vomiting  . Glaucoma   . Hernia   . Hyperlipidemia   . Pulmonary emboli ([place]) 2011  . Stroke [facility]) 2010   Past Surgical History:  Procedure Laterality Date  . ABDOMINAL HYSTERECTOMY    . BREAST EXCISIONAL BIOPSY Right    pt states she had "lumps removed from breast as a teen"  . CEREBRAL ANEURYSM REPAIR  2011   stent and coil insertion  . CHOLECYSTECTOMY    . COLONOSCOPY  2013   [facility]  . HERNIA REPAIR  2014   lap paraesophageal repair  . INTRAMEDULLARY (IM) NAIL INTERTROCHANTERIC Left 10/19/2014   Procedure: INTRAMEDULLARY (IM) NAIL INTERTROCHANTRIC;  Surgeon: Earnestine Leys, MD;  Location: [facility] ORS;  Service: Orthopedics;  Laterality: Left;  . UPPER GI ENDOSCOPY  2013, 2014   [facility]   Family History  Problem Relation Age of Onset  . Emphysema Father   . COPD Mother   . Heart disease Mother   . Heart disease Brother    History  Sexual Activity  . Sexual activity: No    Outpatient Encounter Prescriptions as of 07/31/2016  Medication Sig  . amphetamine-dextroamphetamine  (ADDERALL) 10 MG tablet Take 1 tablet (10 mg total) by mouth 2 (two) times daily.  . ARIPiprazole (ABILIFY) 30 MG tablet TAKE 1 TABLET BY MOUTH ONCE A DAY  . buPROPion (WELLBUTRIN SR) 150 MG 12 hr tablet TAKE 1 TABLET BY MOUTH ONCE DAILY  . citalopram (CELEXA) 40 MG tablet TAKE 1 TABLET BY MOUTH ONCE A DAY  . gabapentin (NEURONTIN) 400 MG capsule TAKE 1 CAPSULE BY MOUTH 3 TIMES A DAY  . latanoprost (XALATAN) 0.005 % ophthalmic solution Place 1 drop into both eyes at bedtime.   . temazepam (RESTORIL) 30 MG capsule Take 1 capsule (30 mg total) by mouth daily.  . clonazePAM (KLONOPIN) 1 MG tablet   . meloxicam (MOBIC) 7.5 MG tablet Take 1 tablet (7.5 mg total) by mouth 2 (two) times daily as needed for pain. (Patient not taking: Reported on 07/25/2016)  . [DISCONTINUED] amphetamine-dextroamphetamine (ADDERALL) 10 MG tablet Take 1 tablet (10 mg total) by mouth 2 (two) times daily.  . [DISCONTINUED] amphetamine-dextroamphetamine (ADDERALL) 10 MG tablet Take 1 tablet (10 mg total) by mouth 2 times daily at 12 noon and 4 pm.   No facility-administered encounter medications on file as of 07/31/2016.     Activities of Daily Living In your present state of health,  do you have any difficulty performing the following activities: 07/31/2016  Hearing? N  Vision? Y  Difficulty concentrating or making decisions? Y  Walking or climbing stairs? Y  Dressing or bathing? Y  Doing errands, shopping? Y  Preparing Food and eating ? Y  Using the Toilet? N  In the past six months, have you accidently leaked urine? N  Do you have problems with loss of bowel control? N  Managing your Medications? N  Managing your Finances? N  Housekeeping or managing your Housekeeping? Y  Some recent data might be hidden    Patient Care Team: Jerrol Banana., MD as PCP - General (Family Medicine) Bary Castilla, Forest Gleason, MD (General Surgery) Birder Robson, MD as Referring Physician (Ophthalmology) Earnestine Leys, MD as  Consulting Physician (Specialist)    Assessment:     Exercise Activities and Dietary recommendations Current Exercise Habits: Home exercise routine, Type of exercise: stretching;strength training/weights, Time (Minutes): 10, Frequency (Times/Week): 1, Weekly Exercise (Minutes/Week): 10, Intensity: Mild  Goals    . Exercise 150 minutes per week (moderate activity)    . Increase water intake          Recommend increasing water intake to 4 glasses a day.       Fall Risk Fall Risk  07/31/2016 07/31/2015 03/15/2015 09/20/2014  Falls in the past year? Yes Yes Yes Yes  Number falls in past yr: 1 1 2  or more 2 or more  Injury with Fall? No - Yes Yes  Risk Factor Category  - - High Fall Risk -  Risk for fall due to : - - - Impaired balance/gait  Follow up Falls prevention discussed - Falls prevention discussed -   Depression Screen PHQ 2/9 Scores 07/31/2016 04/24/2016 07/31/2015 03/15/2015  PHQ - 2 Score 1 4 3 6   PHQ- 9 Score - 18 14 19      Cognitive Function     6CIT Screen 07/31/2016  What Year? 0 points  What month? 0 points  What time? 0 points  Count back from 20 4 points  Months in reverse 4 points  Repeat phrase 4 points  Total Score 12    Immunization History  Administered Date(s) Administered  . Influenza, High Dose Seasonal PF 11/09/2014, 10/31/2015  . Pneumococcal Conjugate-13 08/19/2013   Screening Tests Health Maintenance  Topic Date Due  . PNA vac Low Risk Adult (2 of 2 - PPSV23) 09/30/2016 (Originally 08/20/2014)  . COLONOSCOPY  07/12/2017 (Originally 08/14/1995)  . Hepatitis C Screening  07/12/2017 (Originally [birth year])  . TETANUS/TDAP  02/11/2026 (Originally 08/13/1964)  . INFLUENZA VACCINE  09/11/2016  . MAMMOGRAM  03/21/2017  . DEXA SCAN  Completed      Plan:  I have personally reviewed and addressed the Medicare Annual Wellness questionnaire and have noted the following in the patient's chart:  A. Medical and social history B. Use of alcohol, tobacco or  illicit drugs  C. Current medications and supplements D. Functional ability and status E.  Nutritional status F.  Physical activity G. Advance directives H. List of other physicians I.  Hospitalizations, surgeries, and ER visits in previous 12 months J.  [place] such as hearing and vision if needed, cognitive and depression L. Referrals and appointments - none  In addition, I have reviewed and discussed with patient certain preventive protocols, quality metrics, and best practice recommendations. A written personalized care plan for preventive services as well as general preventive health recommendations were provided to patient.  See attached scanned  questionnaire for additional information.   Signed,  Fabio Neighbors, LPN Nurse Health Advisor   MD Recommendations: Pt declined colonoscopy referral, Hepatitis C screening, tetanus vaccine and Pneumovax 23 vaccine today. Pt would like to receive the Pneumovax 23 at next OV on 10/01/16.

## 2016-08-06 DIAGNOSIS — S7002XD Contusion of left hip, subsequent encounter: Secondary | ICD-10-CM | POA: Diagnosis not present

## 2016-08-06 DIAGNOSIS — M25552 Pain in left hip: Secondary | ICD-10-CM | POA: Diagnosis not present

## 2016-08-19 ENCOUNTER — Other Ambulatory Visit: Payer: Self-pay | Admitting: Family Medicine

## 2016-08-21 ENCOUNTER — Other Ambulatory Visit: Payer: Self-pay | Admitting: Specialist

## 2016-08-21 DIAGNOSIS — M25552 Pain in left hip: Secondary | ICD-10-CM

## 2016-08-26 ENCOUNTER — Other Ambulatory Visit: Payer: Self-pay | Admitting: Family Medicine

## 2016-09-02 ENCOUNTER — Encounter
Admission: RE | Admit: 2016-09-02 | Discharge: 2016-09-02 | Disposition: A | Discharge status: Home / Self Care | Payer: PPO | Source: Ambulatory Visit | Attending: Specialist | Admitting: Specialist

## 2016-09-02 ENCOUNTER — Other Ambulatory Visit: Payer: Self-pay | Admitting: Family Medicine

## 2016-09-02 DIAGNOSIS — M25552 Pain in left hip: Secondary | ICD-10-CM | POA: Diagnosis not present

## 2016-09-02 MED ORDER — TECHNETIUM TC 99M MEDRONATE IV KIT
23.4580 | PACK | Freq: Once | INTRAVENOUS | Status: AC | PRN
  Administered 2016-09-02: 23.458 via INTRAVENOUS

## 2016-09-04 NOTE — Telephone Encounter (Signed)
Pt called regarding her rx's that she needs refilled by [facility]  Bupropion 150mg   Aripiprazole 30mg   Clonazepam 1 mg  Pt's call back I [phone]  Thanks Con Memos

## 2016-09-04 NOTE — Telephone Encounter (Signed)
Please review-aa 

## 2016-09-05 ENCOUNTER — Other Ambulatory Visit: Payer: Self-pay | Admitting: Family Medicine

## 2016-09-05 NOTE — Telephone Encounter (Signed)
This was approved the other day but was never called in, Called in [place]

## 2016-09-05 NOTE — Telephone Encounter (Signed)
Pt needs refill on her clonazepam 1 mg  [facility]

## 2016-09-11 DIAGNOSIS — M48062 Spinal stenosis, lumbar region with neurogenic claudication: Secondary | ICD-10-CM | POA: Diagnosis not present

## 2016-09-16 ENCOUNTER — Other Ambulatory Visit: Payer: Self-pay | Admitting: Specialist

## 2016-09-16 DIAGNOSIS — M48062 Spinal stenosis, lumbar region with neurogenic claudication: Secondary | ICD-10-CM

## 2016-09-24 ENCOUNTER — Ambulatory Visit
Admission: RE | Admit: 2016-09-24 | Discharge: 2016-09-24 | Disposition: A | Discharge status: Home / Self Care | Payer: PPO | Source: Ambulatory Visit | Attending: Specialist | Admitting: Specialist

## 2016-09-24 DIAGNOSIS — M48061 Spinal stenosis, lumbar region without neurogenic claudication: Secondary | ICD-10-CM | POA: Diagnosis not present

## 2016-09-24 DIAGNOSIS — M48062 Spinal stenosis, lumbar region with neurogenic claudication: Secondary | ICD-10-CM

## 2016-09-24 DIAGNOSIS — M5127 Other intervertebral disc displacement, lumbosacral region: Secondary | ICD-10-CM | POA: Diagnosis not present

## 2016-09-26 DIAGNOSIS — M48062 Spinal stenosis, lumbar region with neurogenic claudication: Secondary | ICD-10-CM | POA: Diagnosis not present

## 2016-09-30 DIAGNOSIS — H2512 Age-related nuclear cataract, left eye: Secondary | ICD-10-CM | POA: Diagnosis not present

## 2016-10-01 ENCOUNTER — Ambulatory Visit (INDEPENDENT_AMBULATORY_CARE_PROVIDER_SITE_OTHER): Payer: PPO | Admitting: Family Medicine

## 2016-10-01 ENCOUNTER — Ambulatory Visit: Payer: PPO | Admitting: Family Medicine

## 2016-10-01 VITALS — BP 120/74 | HR 80 | Temp 98.1°F | Resp 18 | Wt 214.0 lb

## 2016-10-01 DIAGNOSIS — M1610 Unilateral primary osteoarthritis, unspecified hip: Secondary | ICD-10-CM

## 2016-10-01 DIAGNOSIS — F319 Bipolar disorder, unspecified: Secondary | ICD-10-CM

## 2016-10-01 DIAGNOSIS — M48061 Spinal stenosis, lumbar region without neurogenic claudication: Secondary | ICD-10-CM

## 2016-10-01 DIAGNOSIS — R251 Tremor, unspecified: Secondary | ICD-10-CM

## 2016-10-01 NOTE — Progress Notes (Signed)
Gail Hardy  MRN: [id number] DOB: [birth year]  Subjective:  HPI  Patient is here for 2 months follow up for hip pain. Last office visit was on 07/25/16 for routine follow up and then saw [facility] for Wellness visit on 07/31/16. Last lab work was done routine on 04/24/16.  HIP PAIN: patient has seen Dr Sabra Heck twice since last visit with [place]. She had CT scan of the left hip and MRI lumbar area done -she has bulging disc per results. Patient was put on Prednisone course and given Tylenol.Codeine. She takes Tylenol/Codeine as needed when pain gets severe. Medication does not help. She does have appointment to see Dr Cari Caraway at [facility] this week Thursday 23rd to follow up on this pain.  Pain is described as an ache and mainly in the left buttocks and goes down left leg. She is unable to put weight on the left side due to the pain. Pain is worse with sitting and walking but does get better with laying down. No falls. Patient is using a walker to ambulate.  Wt Readings from Last 3 Encounters:  10/01/16 214 lb (97.1 kg)  07/31/16 210 lb (95.3 kg)  07/25/16 210 lb (95.3 kg)   Depression screen [facility] 2/9 07/31/2016 04/24/2016 07/31/2015  Decreased Interest 1 2 0  Down, Depressed, Hopeless 0 2 3  PHQ - 2 Score 1 4 3   Altered sleeping - 3 3  Tired, decreased energy - 3 2  Change in appetite - 2 3  Feeling bad or failure about yourself  - 3 3  Trouble concentrating - 0 0  Moving slowly or fidgety/restless - 0 0  Suicidal thoughts - 3 0  PHQ-9 Score - 18 14  Difficult doing work/chores - - Very difficult    Patient Active Problem List   Diagnosis Date Noted  . Cerebrovascular accident (CVA) ([place]) 03/15/2015  . Pressure ulcer 10/22/2014  . Stress fracture of hip with delayed healing 10/19/2014  . Allergic rhinitis 09/20/2014  . Anxiety disorder 09/20/2014  . Absolute anemia 09/20/2014  . Back ache 09/20/2014  . Bipolar 1 disorder, depressed ([place]) 09/20/2014  . Body mass index of  60 or higher 09/20/2014  . Anemia due to blood loss 09/20/2014  . Aneurysm, cerebral 09/20/2014  . Chronic headache 09/20/2014  . Claustrophobia 09/20/2014  . Cerebral vascular accident ([place]) 09/20/2014  . Acute confusion due to medical condition 09/20/2014  . Clinical depression 09/20/2014  . Abnormal liver enzymes 09/20/2014  . 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 Diaphragmatic hernia 09/20/2014  . Gastric erosion 09/20/2014  . Enterogastritis 09/20/2014  . 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 Hypercholesteremia 09/20/2014  . Cannot sleep 09/20/2014  . Malaise and fatigue 09/20/2014  . Bad memory 09/20/2014  . Altered blood in stool 09/20/2014  . Candida glabrata infection 09/20/2014  . Decreased motor strength 09/20/2014  . Adiposity 09/20/2014  . Arthritis, degenerative 09/20/2014  . Excess weight 09/20/2014  . Paralysis agitans ([place]) 09/20/2014  . Breakdown 09/20/2014  . PE (pulmonary embolism) 09/20/2014  . Hypercholesterolemia without hypertriglyceridemia 09/20/2014  . Restless leg 09/20/2014  . Neuralgia neuritis, sciatic nerve 09/20/2014  . Gastric ulcer 09/20/2014  . Fast heart beat 09/20/2014  . Temporary cerebral vascular dysfunction 09/20/2014  . Has a tremor 09/20/2014  . Adynamia 09/20/2014  . Classical migraine with intractable migraine 08/10/2013  . Dysphagia, unspecified(787.20) 08/05/2012  . Paraesophageal hernia 08/04/2012    Past Medical History:  Diagnosis Date  . Anemia   . Aneurysm ([place])  2011  . Blood transfusion without reported diagnosis 2013,2014   x 2  . Cancer ([place])   . Depression   . Family history of adverse reaction to anesthesia    daughter nausea and vomiting  . Glaucoma   . Hernia   . Hyperlipidemia   . Pulmonary emboli ([place]) 2011  . Stroke [facility]) 2010    Social History   Social History  . Marital status: Married    Spouse name: [name]  . Number of children: 2  . Years of education: N/A   Occupational History    . Not on file.   Social History Main Topics  . Smoking status: Former Smoker    Years: 10.00    Types: Cigarettes    Quit date: 02/10/1989  . Smokeless tobacco: Never Used  . Alcohol use No  . Drug use: No  . Sexual activity: No   Other Topics Concern  . Not on file   Social History Narrative  . No narrative on file    Outpatient Encounter Prescriptions as of 10/01/2016  Medication Sig  . acetaminophen-codeine (TYLENOL #3) 300-30 MG tablet   . amphetamine-dextroamphetamine (ADDERALL) 10 MG tablet Take 1 tablet (10 mg total) by mouth 2 (two) times daily.  . ARIPiprazole (ABILIFY) 30 MG tablet TAKE 1 TABLET BY MOUTH ONCE DAILY  . buPROPion (WELLBUTRIN SR) 150 MG 12 hr tablet TAKE 1 TABLET BY MOUTH ONCE A DAY  . citalopram (CELEXA) 40 MG tablet TAKE 1 TABLET BY MOUTH ONCE A DAY  . clonazePAM (KLONOPIN) 1 MG tablet TAKE 1 TABLET BY MOUTH EVERY 8 HOURS AS NEEDED FOR ANXIETY  . gabapentin (NEURONTIN) 400 MG capsule TAKE 1 CAPSULE BY MOUTH 3 TIMES A DAY  . latanoprost (XALATAN) 0.005 % ophthalmic solution Place 1 drop into both eyes at bedtime.   . promethazine (PHENERGAN) 25 MG tablet TAKE 1/2 TO 1 TABLET BY MOUTH EVERY 6 HOURS AS NEEDED  . temazepam (RESTORIL) 30 MG capsule Take 1 capsule (30 mg total) by mouth daily.  . [DISCONTINUED] meloxicam (MOBIC) 7.5 MG tablet Take 1 tablet (7.5 mg total) by mouth 2 (two) times daily as needed for pain. (Patient not taking: Reported on 07/25/2016)   No facility-administered encounter medications on file as of 10/01/2016.     Allergies  Allergen Reactions  . Diazepam     Other reaction(s): Feeling agitated (finding)    Review of Systems  Constitutional: Positive for malaise/fatigue.  Respiratory: Negative.   Cardiovascular: Negative.   Gastrointestinal: Negative.   Musculoskeletal: Positive for back pain and joint pain. Negative for falls.  Neurological: Positive for tremors and weakness. Negative for dizziness and headaches.   Endo/Heme/Allergies: Negative.   Psychiatric/Behavioral: Negative.     Objective:  BP 120/74   Pulse 80   Temp 98.1 F (36.7 C)   Resp 18   Wt 214 lb (97.1 kg)   BMI 43.22 kg/m   Physical Exam  Constitutional: She is oriented to person, place, and time and well-developed, well-nourished, and in no distress.  HENT:  Head: Normocephalic and atraumatic.  Eyes: Conjunctivae are normal. No scleral icterus.  Neck: No thyromegaly present.  Cardiovascular: Normal rate, regular rhythm, normal heart sounds and intact distal pulses.  Exam reveals no gallop.   No murmur heard. Pulmonary/Chest: Effort normal and breath sounds normal. No respiratory distress. She has no wheezes.  Neurological: She is alert and oriented to person, place, and time. Gait abnormal.  Resting trmor of hands.  Skin: Skin is  warm and dry.  Psychiatric: Mood, memory, affect and judgment normal.   Assessment and Plan :  1. Spinal stenosis of lumbar region, unspecified whether neurogenic claudication present Patient has appointment scheduled with Dr Cari Caraway this week. I am unwilling to give her narcotics due to her multiple  other medications. 2. Primary osteoarthritis of hip, unspecified laterality  3. Bipolar 1 disorder, depressed ([place]) Stable.  4. Has a tremor  HPI, Exam and A&P transcribed by Theressa Millard, RMA under direction and in the presence of Miguel Aschoff, MD. I have done the exam and reviewed the chart and it is accurate to the best of my knowledge. Development worker, community has been used and  any errors in dictation or transcription are unintentional. Miguel Aschoff M.D. [facility]

## 2016-10-03 DIAGNOSIS — M5416 Radiculopathy, lumbar region: Secondary | ICD-10-CM | POA: Diagnosis not present

## 2016-10-07 ENCOUNTER — Telehealth: Payer: Self-pay | Admitting: Family Medicine

## 2016-10-07 NOTE — Telephone Encounter (Signed)
Pt is requesting something called into [facility] for left leg pain.She states she was recently in office for this problem

## 2016-10-07 NOTE — Telephone Encounter (Signed)
Try topical capzasin 3-4 times daily.

## 2016-10-07 NOTE — Telephone Encounter (Signed)
Please review-aa 

## 2016-10-08 DIAGNOSIS — M1189 Other specified crystal arthropathies, multiple sites: Secondary | ICD-10-CM | POA: Insufficient documentation

## 2016-10-08 DIAGNOSIS — M25552 Pain in left hip: Secondary | ICD-10-CM

## 2016-10-08 DIAGNOSIS — Z8739 Personal history of other diseases of the musculoskeletal system and connective tissue: Secondary | ICD-10-CM | POA: Insufficient documentation

## 2016-10-08 DIAGNOSIS — M25512 Pain in left shoulder: Secondary | ICD-10-CM

## 2016-10-08 DIAGNOSIS — Z79899 Other long term (current) drug therapy: Secondary | ICD-10-CM | POA: Insufficient documentation

## 2016-10-08 DIAGNOSIS — M1612 Unilateral primary osteoarthritis, left hip: Secondary | ICD-10-CM | POA: Insufficient documentation

## 2016-10-08 DIAGNOSIS — Z79891 Long term (current) use of opiate analgesic: Secondary | ICD-10-CM | POA: Insufficient documentation

## 2016-10-08 DIAGNOSIS — F1190 Opioid use, unspecified, uncomplicated: Secondary | ICD-10-CM | POA: Insufficient documentation

## 2016-10-08 DIAGNOSIS — M47816 Spondylosis without myelopathy or radiculopathy, lumbar region: Secondary | ICD-10-CM | POA: Insufficient documentation

## 2016-10-08 DIAGNOSIS — M159 Polyosteoarthritis, unspecified: Secondary | ICD-10-CM | POA: Insufficient documentation

## 2016-10-08 DIAGNOSIS — M1712 Unilateral primary osteoarthritis, left knee: Secondary | ICD-10-CM | POA: Insufficient documentation

## 2016-10-08 DIAGNOSIS — M545 Low back pain: Secondary | ICD-10-CM

## 2016-10-08 DIAGNOSIS — R209 Unspecified disturbances of skin sensation: Secondary | ICD-10-CM | POA: Insufficient documentation

## 2016-10-08 DIAGNOSIS — G894 Chronic pain syndrome: Secondary | ICD-10-CM | POA: Insufficient documentation

## 2016-10-08 DIAGNOSIS — M25562 Pain in left knee: Secondary | ICD-10-CM

## 2016-10-08 DIAGNOSIS — G8929 Other chronic pain: Secondary | ICD-10-CM | POA: Insufficient documentation

## 2016-10-08 DIAGNOSIS — M150 Primary generalized (osteo)arthritis: Secondary | ICD-10-CM

## 2016-10-08 MED ORDER — CAPSAICIN 0.025 % EX CREA: TOPICAL_CREAM | Freq: Three times a day (TID) | CUTANEOUS | 0 refills | Status: DC

## 2016-10-08 NOTE — Progress Notes (Deleted)
Patient's Name: Gail Hardy  MRN: [id number]  Referring Provider: Meade Maw, MD  DOB: Apr 03, 1945  PCP: Jerrol Banana., MD  DOS: 10/09/2016  Note by: Gaspar Cola, MD  Service setting: Ambulatory outpatient  Specialty: Interventional Pain Management  Location: [facility] (AMB) Pain Management Facility  Visit type: Initial Patient Evaluation  Patient type: New Patient   Primary Reason(s) for Visit: Encounter for initial evaluation of one or more chronic problems (new to examiner) potentially causing chronic pain, and posing a threat to normal musculoskeletal function. (Level of risk: High) CC: No chief complaint on file.  HPI  Gail Hardy is a 71 y.o. year old, female patient, who comes today to see [place] for the first time for an initial evaluation of her chronic pain. She has Paraesophageal hernia; Dysphagia, unspecified(787.20); Allergic rhinitis; Anxiety disorder; Absolute anemia; Bipolar 1 disorder, depressed ([place]); Body mass index of 60 or higher; Anemia due to blood loss; Aneurysm, cerebral; Chronic headache; Claustrophobia; Cerebral vascular accident ([place]); Acute confusion due to medical condition; Clinical depression; Abnormal liver enzymes; Acquired bronchoesophageal fistula; Diaphragmatic hernia; Gastric erosion; Enterogastritis; Bergmann's syndrome; Hypercholesteremia; Cannot sleep; Malaise and fatigue; Bad memory; Altered blood in stool; Classical migraine with intractable migraine; Candida glabrata infection; Decreased motor strength; Adiposity; Excess weight; Paralysis agitans ([place]); Breakdown; PE (pulmonary embolism); Hypercholesterolemia without hypertriglyceridemia; Restless leg; Neuralgia neuritis, sciatic nerve; Gastric ulcer; Fast heart beat; Temporary cerebral vascular dysfunction; Has a tremor; Adynamia; Stress fracture of hip with delayed healing; Pressure ulcer; Cerebrovascular accident (CVA) ([place]); History of calcium pyrophosphate deposition disease (CPPD);  Pseudogout involving multiple joints; Osteoarthritis; Lumbar facet hypertrophy; Spondylosis of lumbar spine; Chronic low back pain (Bilateral); Chronic shoulder pain (Left); Long term prescription benzodiazepine use; Long term prescription opiate use; Long term (current) use of opiate analgesic; Opiate use; Chronic knee pain (Left); Osteoarthritis of knee (Left); Chronic hip pain (Left); Osteoarthritis of hip (Left); Chronic pain syndrome; and Disturbance of skin sensation on her problem list. Today she comes in for evaluation of her No chief complaint on file.  Pain Assessment: Location:     Radiating:   Onset:   Duration:   Quality:   Severity:  /10 (self-reported pain score)  Note: Reported level is compatible with observation.                   Effect on ADL:   Timing:   Modifying factors:    Onset and Duration: {Hx; Onset and Duration:210120511} Cause of pain: {Hx; Cause:210120521} Severity: {Pain Severity:210120502} Timing: {Symptoms; Timing:210120501} Aggravating Factors: {Causes; Aggravating pain factors:210120507} Alleviating Factors: {Causes; Alleviating Factors:210120500} Associated Problems: {Hx; Associated problems:210120515} Quality of Pain: {Hx; Symptom quality or Descriptor:210120531} Previous Examinations or Tests: {Hx; Previous examinations or test:210120529} Previous Treatments: {Hx; Previous Treatment:210120503}  The patient comes into the clinics today for the first time for a chronic pain management evaluation. ***  Today I took the time to provide the patient with information regarding my pain practice. The patient was informed that my practice is divided into two sections: an interventional pain management section, as well as a completely separate and distinct medication management section. I explained that I have procedure days for my interventional therapies, and evaluation days for follow-ups and medication management. Because of the amount of documentation  required during both, they are kept separated. This means that there is the possibility that she may be scheduled for a procedure on one day, and medication management the next. I have also informed her that because of staffing and  facility limitations, I no longer take patients for medication management only. To illustrate the reasons for this, I gave the patient the example of surgeons, and how inappropriate it would be to refer a patient to his/her care, just to write for the post-surgical antibiotics on a surgery done by a different surgeon.   Because interventional pain management is my board-certified specialty, the patient was informed that joining my practice means that they are open to any and all interventional therapies. I made it clear that this does not mean that they will be forced to have any procedures done. What this means is that I believe interventional therapies to be essential part of the diagnosis and proper management of chronic pain conditions. Therefore, patients not interested in these interventional alternatives will be better served under the care of a different practitioner.  The patient was also made aware of my Comprehensive Pain Management Safety Guidelines where by joining my practice, they limit all of their nerve blocks and joint injections to those done by our practice, for as long as we are retained to manage their care.   Historic Controlled Substance Pharmacotherapy Review  PMP and historical list of controlled substances: Tylenol 3; clonazepam 1 mg; dextroamphetamine 10 mg; temazepam 30 mg; hydrocodone/APAP 5/325; phentermine 37.5 mg; hydrocodone/APAP 10/325; alprazolam 2 mg; oxycodone 10 mg; Ambien 10 mg Highest opioid analgesic regimen found: Oxycodone IR 10 mg 1 tablet by mouth every 6 hours (40 mg/day oxycodone) (60 MME/day)  Most recent opioid analgesic: Tylenol w/ Codeine (Tylenol No. 3) one tablet by mouth every 6 hours (18 MME/day) (last prescription written  on 10/07/2016) Current opioid analgesics: Tylenol w/ Codeine (Tylenol No. 3) one tablet by mouth every 6 hours (18 MME/day) (last prescription written on 10/07/2016)  Highest recorded MME/day: 60 mg/day MME/day: 18 mg/day Medications: The patient did not bring the medication(s) to the appointment, as requested in our "New Patient Package" Pharmacodynamics: Desired effects: Analgesia: The patient reports >50% benefit. Reported improvement in function: The patient reports medication allows her to accomplish basic ADLs. Clinically meaningful improvement in function (CMIF): Sustained CMIF goals met Perceived effectiveness: Described as relatively effective, allowing for increase in activities of daily living (ADL) Undesirable effects: Side-effects or Adverse reactions: None reported Historical Monitoring: The patient  reports that she does not use drugs. List of all UDS Test(s): No results found for: MDMA, COCAINSCRNUR, PCPSCRNUR, PCPQUANT, CANNABQUANT, THCU, [place] List of all Serum Drug Screening Test(s):  No results found for: AMPHSCRSER, BARBSCRSER, BENZOSCRSER, COCAINSCRSER, PCPSCRSER, PCPQUANT, THCSCRSER, CANNABQUANT, OPIATESCRSER, OXYSCRSER, PROPOXSCRSER Historical Background Evaluation: [place] PDMP: Six (6) year initial data search conducted.             [place] Department of public safety, offender search: Editor, commissioning Information) Non-contributory Risk Assessment Profile: Aberrant behavior: None observed or detected today Risk factors for fatal opioid overdose: None identified today Fatal overdose hazard ratio (HR): Calculation deferred Non-fatal overdose hazard ratio (HR): Calculation deferred Risk of opioid abuse or dependence: 0.7-3.0% with doses ? 36 MME/day and 6.1-26% with doses ? 120 MME/day. Substance use disorder (SUD) risk level: Pending results of Medical Psychology Evaluation for SUD Opioid risk tool (ORT) (Total Score):    ORT Scoring interpretation table:  Score <3 = Low Risk for  SUD  Score between 4-7 = Moderate Risk for SUD  Score >8 = High Risk for Opioid Abuse   PHQ-2 Depression Scale:  Total score:    PHQ-2 Scoring interpretation table: (Score and probability of major depressive disorder)  Score 0 =  No depression  Score 1 = 15.4% Probability  Score 2 = 21.1% Probability  Score 3 = 38.4% Probability  Score 4 = 45.5% Probability  Score 5 = 56.4% Probability  Score 6 = 78.6% Probability   PHQ-9 Depression Scale:  Total score:    PHQ-9 Scoring interpretation table:  Score 0-4 = No depression  Score 5-9 = Mild depression  Score 10-14 = Moderate depression  Score 15-19 = Moderately severe depression  Score 20-27 = Severe depression (2.4 times higher risk of SUD and 2.89 times higher risk of overuse)   Pharmacologic Plan: Pending ordered tests and/or consults            Initial impression: Pending review of available data and ordered tests.  Meds   Current Outpatient Prescriptions:  .  acetaminophen-codeine (TYLENOL #3) 300-30 MG tablet, , Disp: , Rfl:  .  amphetamine-dextroamphetamine (ADDERALL) 10 MG tablet, Take 1 tablet (10 mg total) by mouth 2 (two) times daily., Disp: 60 tablet, Rfl: 0 .  ARIPiprazole (ABILIFY) 30 MG tablet, TAKE 1 TABLET BY MOUTH ONCE DAILY, Disp: 30 tablet, Rfl: 11 .  buPROPion (WELLBUTRIN SR) 150 MG 12 hr tablet, TAKE 1 TABLET BY MOUTH ONCE A DAY, Disp: 90 tablet, Rfl: 3 .  capsaicin (ZOSTRIX) 0.025 % cream, Apply topically 3 (three) times daily., Disp: 60 g, Rfl: 0 .  citalopram (CELEXA) 40 MG tablet, TAKE 1 TABLET BY MOUTH ONCE A DAY, Disp: 30 tablet, Rfl: 11 .  clonazePAM (KLONOPIN) 1 MG tablet, TAKE 1 TABLET BY MOUTH EVERY 8 HOURS AS NEEDED FOR ANXIETY, Disp: 90 tablet, Rfl: 4 .  gabapentin (NEURONTIN) 400 MG capsule, TAKE 1 CAPSULE BY MOUTH 3 TIMES A DAY, Disp: 270 capsule, Rfl: 3 .  latanoprost (XALATAN) 0.005 % ophthalmic solution, Place 1 drop into both eyes at bedtime. , Disp: , Rfl:  .  promethazine (PHENERGAN) 25 MG  tablet, TAKE 1/2 TO 1 TABLET BY MOUTH EVERY 6 HOURS AS NEEDED, Disp: 100 tablet, Rfl: 0 .  temazepam (RESTORIL) 30 MG capsule, Take 1 capsule (30 mg total) by mouth daily., Disp: 30 capsule, Rfl: 5  Imaging Review  Shoulder Imaging: Shoulder-L DG:  Results for orders placed during the hospital encounter of 09/15/15  DG Shoulder Left   Narrative CLINICAL DATA:  Pain after fall 5 days ago  EXAM: LEFT SHOULDER - 2+ VIEW  COMPARISON:  None.  FINDINGS: A lucency in the scapula is probably a nutrient foramen. The transscapular Y view is limited but there is no convincing evidence of dislocation. No fractures are noted.  IMPRESSION: Evaluation for dislocation is limited as the transscapular Y view is suboptimal. However, there is no convincing evidence of fracture or dislocation on this study. If there is concern for dislocation, an axillary view could be obtained.   Electronically Signed   By: Dorise Bullion III M.D   On: 09/15/2015 20:56    Thoracic Imaging: Thoracic DG 2-3 views:  Results for orders placed in visit on 09/17/10  [facility] Thoracic Spine 2 View   Narrative * PRIOR REPORT IMPORTED FROM AN EXTERNAL SYSTEM *   PRIOR REPORT IMPORTED FROM THE [facility] [place] EXAM:    pain trauma  COMMENTS:   PROCEDURE:     KDR - KDXR THORACIC AP AND LATERAL  - Sep 17 2010 11:43AM   RESULT:     The thoracic vertebral bodies are preserved in height. The  intravertebral disc space heights are reasonably well maintained  though  mild  narrowing is seen at several levels. I do not see evidence of an acute  compression fracture. I see no abnormal paravertebral soft tissue  densities.   IMPRESSION:      I do not see evidence of an acute fracture of the  thoracic  spine.   If the patient's symptoms persist and remain unexplained, followup nuclear  bone scanning or MRI of the symptomatic area of the spine could be  considered in an effort to detect reactive bony  change or bone marrow  edema.       Lumbosacral Imaging: Lumbar MR wo contrast:  Results for orders placed during the hospital encounter of 09/24/16  MR LUMBAR SPINE WO CONTRAST   Narrative CLINICAL DATA:  Left lower extremity radicular pain.  EXAM: MRI LUMBAR SPINE WITHOUT CONTRAST  TECHNIQUE: Multiplanar, multisequence MR imaging of the lumbar spine was performed. No intravenous contrast was administered.  COMPARISON:  None.  FINDINGS: Segmentation:  Standard  Alignment:  Normal  Vertebrae:  L3 hemangioma.  No compression fracture the he.  Conus medullaris: Extends to the L1 level and appears normal.  Paraspinal and other soft tissues: Exophytic right renal cyst measures 1.9 cm.  Disc levels:  T12-L1: Normal disc space and facets. No spinal canal or neuroforaminal stenosis.  L1-L2: Normal disc space and facets. No spinal canal or neuroforaminal stenosis.  L2-L3: Mild facet hypertrophy. No spinal canal or neural foraminal stenosis.  L3-L4: Mild disc bulge and mild facet hypertrophy. No spinal canal or neural foraminal stenosis.  L4-L5: Mild disc bulge and mild facet hypertrophy. Mild narrowing of the spinal canal. No neural foraminal stenosis.  L5-S1: Disc space narrowing with moderate facet hypertrophy. Small central disc extrusion with components of minimal superior and inferior migration. This narrows both lateral recesses. No neural foraminal stenosis.  Visualized sacrum: Normal.  IMPRESSION: 1. Predominant finding is small central disc extrusion with components of minimal superior and inferior migration at L5-S1. This narrows both lateral recesses and could cause bilateral S1 radiculopathy. 2. Mild spinal canal stenosis at L4-L5. 3. No neural foraminal stenosis.   Electronically Signed   By: Ulyses Jarred M.D.   On: 09/24/2016 14:33    Lumbar DG (Complete) 4+V:  Results for orders placed in visit on 09/17/10  DG Lumbar Spine Complete    Narrative * PRIOR REPORT IMPORTED FROM AN EXTERNAL SYSTEM *   PRIOR REPORT IMPORTED FROM THE [facility] WORKFLOW SYSTEM   REASON FOR EXAM:    pain trauma  COMMENTS:   PROCEDURE:     KDR - KDXR LUMBAR SPINE WITH OBLIQUES  - Sep 17 2010  11:42AM   RESULT:     The lumbar vertebral bodies are preserved in height. There is  disc space narrowing at L3-L4 and at L5-S1. The spinous processes are  intact. I see no spondylolisthesis.   IMPRESSION:      I see no acute bony abnormality of the lumbar spine.  Degenerative disc space narrowing is seen at L3-L4 and at L5-S1.       Hip Imaging: Hip-L CT wo contrast:  Results for orders placed during the hospital encounter of 08/29/14  CT Hip Left Wo Contrast   Narrative CLINICAL DATA:  Severe left groin and hip pain after a fall 1 month ago.  EXAM: CT OF THE LEFT HIP WITHOUT CONTRAST  TECHNIQUE: Multidetector CT imaging of the left hip was performed according to the standard protocol. Multiplanar CT image reconstructions were also generated.  COMPARISON:  None.  FINDINGS: There is no fracture or dislocation or joint effusion. Osseous structures of the left hip appear normal with a benign bone island in the left femoral head. The adjacent muscle structures appear normal.  The patient does have marked degeneration of the L5-S1 disc with herniation of gas into the spinal canal adjacent to the thecal sac and the left S1 nerve root sleeve. This level is incompletely visualized on this hip CT scan.  IMPRESSION: 1. Normal left hip. 2. Severe degeneration of the L5-S1 disc with herniation of gas into the spinal canal adjacent to the left S1 nerve root sleeve. The disc space is incompletely visualized.   Electronically Signed   By: Lorriane Shire M.D.   On: 08/29/2014 11:15    Hip-L DG 2-3 views:  Results for orders placed during the hospital encounter of 09/15/15  DG Hip Unilat With Pelvis 2-3 Views Left   Narrative CLINICAL DATA:   Patient status post fall out of bed 5 days prior. Leg and arm pain. Initial encounter.  EXAM: DG HIP (WITH OR WITHOUT PELVIS) 2-3V LEFT  COMPARISON:  Hand radiograph 10/19/2014  FINDINGS: Postsurgical hardware demonstrated within the proximal left femur. Normal anatomic alignment. No evidence for acute fracture or dislocation.  IMPRESSION: No acute osseous abnormality.   Electronically Signed   By: Lovey Newcomer M.D.   On: 09/15/2015 20:51    Knee Imaging: Knee-L DG 4 views:  Results for orders placed during the hospital encounter of 09/15/15  DG Knee Complete 4 Views Left   Narrative CLINICAL DATA:  Fall 5 days ago.  EXAM: LEFT KNEE - COMPLETE 4+ VIEW  COMPARISON:  None.  FINDINGS: Chondrocalcinosis consistent with CPPD. No fracture, dislocation, or joint effusion.  IMPRESSION: CPPD.  No fracture or dislocation.   Electronically Signed   By: Dorise Bullion III M.D   On: 09/15/2015 20:51    Complexity Note: Imaging results reviewed. Results discussed using Layman's terms.               ROS  Cardiovascular History: {Hx; Cardiovascular History:210120525} Pulmonary or Respiratory History: {Hx; Pumonary and/or Respiratory History:210120523} Neurological History: {Hx; Neurological:210120504} Review of Past Neurological Studies:  Results for orders placed or performed during the hospital encounter of 09/15/15  CT Head Wo Contrast   Narrative   CLINICAL DATA:  Patient status post fall out of bed 5 days prior.  EXAM: CT HEAD WITHOUT CONTRAST  TECHNIQUE: Contiguous axial images were obtained from the base of the skull through the vertex without intravenous contrast.  COMPARISON:  Brain CT 08/04/2013  FINDINGS: Core material demonstrated within the region of the right MCA. Ventricles and sulci are appropriate for patient's age. No evidence for acute cortically based infarct, intracranial hemorrhage, mass lesion or mass-effect. Orbits unremarkable. Scalp  soft tissues are unremarkable.  IMPRESSION: No acute intracranial process.   Electronically Signed   By: Lovey Newcomer M.D.   On: 09/15/2015 20:19    Psychological-Psychiatric History: {Hx; Psychological-Psychiatric History:210120512} Gastrointestinal History: {Hx; Gastrointestinal:210120527} Genitourinary History: {Hx; Genitourinary:210120506} Hematological History: {Hx; Hematological:210120510} Endocrine History: {Hx; Endocrine history:210120509} Rheumatologic History: {Hx; Rheumatological:210120530} Musculoskeletal History: {Hx; Musculoskeletal:210120528} Work History: {Hx; Work history:210120514}  Allergies  Ms. Masi is allergic to diazepam.  Laboratory Chemistry  Inflammation Markers (CRP: Acute Phase) (ESR: Chronic Phase) No results found for: CRP, ESRSEDRATE               Renal Function Markers Lab Results  Component Value Date   BUN 19 04/24/2016  CREATININE 0.88 04/24/2016   GFRAA 77 04/24/2016   GFRNONAA 67 04/24/2016                 Hepatic Function Markers Lab Results  Component Value Date   AST 24 04/24/2016   ALT 22 04/24/2016   ALBUMIN 4.7 04/24/2016   ALKPHOS 97 04/24/2016                 Electrolytes Lab Results  Component Value Date   NA 142 04/24/2016   K 4.2 04/24/2016   CL 101 04/24/2016   CALCIUM 9.5 04/24/2016                 Neuropathy Markers No results found for: [id number]               Bone Pathology Markers Lab Results  Component Value Date   ALKPHOS 97 04/24/2016   CALCIUM 9.5 04/24/2016                 Coagulation Parameters Lab Results  Component Value Date   INR 0.96 10/13/2014   LABPROT 13.0 10/13/2014   PLT 253 04/24/2016                 Cardiovascular Markers Lab Results  Component Value Date   HGB 15.8 04/24/2016   HCT 45.3 04/24/2016                 Note: Lab results reviewed.  Index  Drug: Ms. Wooton  reports that she does not use drugs. Alcohol:  reports that she does not drink  alcohol. Tobacco:  reports that she quit smoking about 27 years ago. Her smoking use included Cigarettes. She quit after 10.00 years of use. She has never used smokeless tobacco. Medical:  has a past medical history of Anemia; Aneurysm ([place]) (2011); Blood transfusion without reported diagnosis (0712,1975); Cancer ([place]); Depression; Family history of adverse reaction to anesthesia; Glaucoma; Hernia; Hyperlipidemia; Pulmonary emboli ([place]) (2011); and Stroke [facility]) (2010). Family: family history includes COPD in her mother; Dementia in her brother; Emphysema in her father; Heart disease in her brother, brother, and mother; Liver disease in her brother.  Past Surgical History:  Procedure Laterality Date  . ABDOMINAL HYSTERECTOMY    . BREAST EXCISIONAL BIOPSY Right    pt states she had "lumps removed from breast as a teen"  . CEREBRAL ANEURYSM REPAIR  2011   stent and coil insertion  . CHOLECYSTECTOMY    . COLONOSCOPY  2013   [facility]  . HERNIA REPAIR  2014   lap paraesophageal repair  . INTRAMEDULLARY (IM) NAIL INTERTROCHANTERIC Left 10/19/2014   Procedure: INTRAMEDULLARY (IM) NAIL INTERTROCHANTRIC;  Surgeon: Earnestine Leys, MD;  Location: [facility] ORS;  Service: Orthopedics;  Laterality: Left;  . UPPER GI ENDOSCOPY  2013, 2014   [place]   Active Ambulatory Problems    Diagnosis Date Noted  . Paraesophageal hernia 08/04/2012  . Dysphagia, unspecified(787.20) 08/05/2012  . Allergic rhinitis 09/20/2014  . Anxiety disorder 09/20/2014  . Absolute anemia 09/20/2014  . Bipolar 1 disorder, depressed ([place]) 09/20/2014  . Body mass index of 60 or higher 09/20/2014  . Anemia due to blood loss 09/20/2014  . Aneurysm, cerebral 09/20/2014  . Chronic headache 09/20/2014  . Claustrophobia 09/20/2014  . Cerebral vascular accident ([place]) 09/20/2014  . Acute confusion due to medical condition 09/20/2014  . Clinical depression 09/20/2014  . Abnormal liver enzymes 09/20/2014  . Acquired bronchoesophageal  fistula 09/20/2014  . Diaphragmatic hernia 09/20/2014  .  Gastric erosion 09/20/2014  . Enterogastritis 09/20/2014  . 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 Hypercholesteremia 09/20/2014  . Cannot sleep 09/20/2014  . Malaise and fatigue 09/20/2014  . Bad memory 09/20/2014  . Altered blood in stool 09/20/2014  . Classical migraine with intractable migraine 08/10/2013  . Candida glabrata infection 09/20/2014  . Decreased motor strength 09/20/2014  . Adiposity 09/20/2014  . Excess weight 09/20/2014  . Paralysis agitans ([place]) 09/20/2014  . Breakdown 09/20/2014  . PE (pulmonary embolism) 09/20/2014  . Hypercholesterolemia without hypertriglyceridemia 09/20/2014  . Restless leg 09/20/2014  . Neuralgia neuritis, sciatic nerve 09/20/2014  . Gastric ulcer 09/20/2014  . Fast heart beat 09/20/2014  . Temporary cerebral vascular dysfunction 09/20/2014  . Has a tremor 09/20/2014  . Adynamia 09/20/2014  . Stress fracture of hip with delayed healing 10/19/2014  . Pressure ulcer 10/22/2014  . Cerebrovascular accident (CVA) ([place]) 03/15/2015  . History of calcium pyrophosphate deposition disease (CPPD) 10/08/2016  . Pseudogout involving multiple joints 10/08/2016  . Osteoarthritis 10/08/2016  . Lumbar facet hypertrophy 10/08/2016  . Spondylosis of lumbar spine 10/08/2016  . Chronic low back pain (Bilateral) 10/08/2016  . Chronic shoulder pain (Left) 10/08/2016  . Long term prescription benzodiazepine use 10/08/2016  . Long term prescription opiate use 10/08/2016  . Long term (current) use of opiate analgesic 10/08/2016  . Opiate use 10/08/2016  . Chronic knee pain (Left) 10/08/2016  . Osteoarthritis of knee (Left) 10/08/2016  . Chronic hip pain (Left) 10/08/2016  . Osteoarthritis of hip (Left) 10/08/2016  . Chronic pain syndrome 10/08/2016  . Disturbance of skin sensation 10/08/2016   Resolved Ambulatory Problems    Diagnosis Date Noted  . No Resolved Ambulatory Problems   Past Medical  History:  Diagnosis Date  . Anemia   . Aneurysm ([place]) 2011  . Blood transfusion without reported diagnosis 2013,2014  . Cancer ([place])   . Depression   . Family history of adverse reaction to anesthesia   . Glaucoma   . Hernia   . Hyperlipidemia   . Pulmonary emboli ([place]) 2011  . Stroke [facility]) 2010   Constitutional Exam  General appearance: Well nourished, well developed, and well hydrated. In no apparent acute distress There were no vitals filed for this visit. BMI Assessment: Estimated body mass index is 43.22 kg/m as calculated from the following:   Height as of 07/31/16: 4' 11"  (1.499 m).   Weight as of 10/01/16: 214 lb (97.1 kg).  BMI interpretation table: BMI level Category Range association with higher incidence of chronic pain  <18 kg/m2 Underweight   18.5-24.9 kg/m2 Ideal body weight   25-29.9 kg/m2 Overweight Increased incidence by 20%  30-34.9 kg/m2 Obese (Class I) Increased incidence by 68%  35-39.9 kg/m2 Severe obesity (Class II) Increased incidence by 136%  >40 kg/m2 Extreme obesity (Class III) Increased incidence by 254%   BMI Readings from Last 4 Encounters:  10/01/16 43.22 kg/m  07/31/16 42.41 kg/m  07/25/16 42.41 kg/m  04/24/16 43.02 kg/m   Wt Readings from Last 4 Encounters:  10/01/16 214 lb (97.1 kg)  07/31/16 210 lb (95.3 kg)  07/25/16 210 lb (95.3 kg)  04/24/16 213 lb (96.6 kg)  Psych/Mental status: Alert, oriented x 3 (person, place, & time)       Eyes: PERLA Respiratory: No evidence of acute respiratory distress  Cervical Spine Area Exam  Skin & Axial Inspection: No masses, redness, edema, swelling, or associated skin lesions Alignment: Symmetrical Functional ROM: Unrestricted ROM      Stability: No  instability detected Muscle Tone/Strength: Functionally intact. No obvious neuro-muscular anomalies detected. Sensory (Neurological): Unimpaired Palpation: No palpable anomalies              Upper Extremity (UE) Exam    Side: Right upper  extremity  Side: Left upper extremity  Skin & Extremity Inspection: Skin color, temperature, and hair growth are WNL. No peripheral edema or cyanosis. No masses, redness, swelling, asymmetry, or associated skin lesions. No contractures.  Skin & Extremity Inspection: Skin color, temperature, and hair growth are WNL. No peripheral edema or cyanosis. No masses, redness, swelling, asymmetry, or associated skin lesions. No contractures.  Functional ROM: Unrestricted ROM          Functional ROM: Unrestricted ROM          Muscle Tone/Strength: Functionally intact. No obvious neuro-muscular anomalies detected.  Muscle Tone/Strength: Functionally intact. No obvious neuro-muscular anomalies detected.  Sensory (Neurological): Unimpaired          Sensory (Neurological): Unimpaired          Palpation: No palpable anomalies              Palpation: No palpable anomalies              Specialized Test(s): Deferred         Specialized Test(s): Deferred          Thoracic Spine Area Exam  Skin & Axial Inspection: No masses, redness, or swelling Alignment: Symmetrical Functional ROM: Unrestricted ROM Stability: No instability detected Muscle Tone/Strength: Functionally intact. No obvious neuro-muscular anomalies detected. Sensory (Neurological): Unimpaired Muscle strength & Tone: No palpable anomalies  Lumbar Spine Area Exam  Skin & Axial Inspection: No masses, redness, or swelling Alignment: Symmetrical Functional ROM: Unrestricted ROM      Stability: No instability detected Muscle Tone/Strength: Functionally intact. No obvious neuro-muscular anomalies detected. Sensory (Neurological): Unimpaired Palpation: No palpable anomalies       Provocative Tests: Lumbar Hyperextension and rotation test: evaluation deferred today       Lumbar Lateral bending test: evaluation deferred today       Patrick's Maneuver: evaluation deferred today                    Gait & Posture Assessment  Ambulation: Unassisted Gait:  Relatively normal for age and body habitus Posture: WNL   Lower Extremity Exam    Side: Right lower extremity  Side: Left lower extremity  Skin & Extremity Inspection: Skin color, temperature, and hair growth are WNL. No peripheral edema or cyanosis. No masses, redness, swelling, asymmetry, or associated skin lesions. No contractures.  Skin & Extremity Inspection: Skin color, temperature, and hair growth are WNL. No peripheral edema or cyanosis. No masses, redness, swelling, asymmetry, or associated skin lesions. No contractures.  Functional ROM: Unrestricted ROM          Functional ROM: Unrestricted ROM          Muscle Tone/Strength: Functionally intact. No obvious neuro-muscular anomalies detected.  Muscle Tone/Strength: Functionally intact. No obvious neuro-muscular anomalies detected.  Sensory (Neurological): Unimpaired  Sensory (Neurological): Unimpaired  Palpation: No palpable anomalies  Palpation: No palpable anomalies   Assessment  Primary Diagnosis & Pertinent Problem List: The primary encounter diagnosis was Chronic pain syndrome. Diagnoses of Lumbar facet hypertrophy, Spondylosis of lumbar spine, Chronic low back pain (Bilateral), Chronic shoulder pain (Left), Chronic knee pain (Left), Osteoarthritis of knee (Left), Chronic hip pain (Left), Osteoarthritis of hip (Left), Osteoarthritis, Pseudogout involving multiple joints, History of calcium pyrophosphate  deposition disease (CPPD), Long term prescription benzodiazepine use, Long term (current) use of opiate analgesic, Long term prescription opiate use, Opiate use, Bergmann's syndrome, and Disturbance of skin sensation were also pertinent to this visit.  Visit Diagnosis (New problems to examiner): 1. Chronic pain syndrome   2. Lumbar facet hypertrophy   3. Spondylosis of lumbar spine   4. Chronic low back pain (Bilateral)   5. Chronic shoulder pain (Left)   6. Chronic knee pain (Left)   7. Osteoarthritis of knee (Left)   8. Chronic  hip pain (Left)   9. Osteoarthritis of hip (Left)   10. Osteoarthritis   11. Pseudogout involving multiple joints   12. History of calcium pyrophosphate deposition disease (CPPD)   13. Long term prescription benzodiazepine use   14. Long term (current) use of opiate analgesic   15. Long term prescription opiate use   16. 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 Disturbance of skin sensation    Plan of Care (Initial workup plan)  Note: Ms. Ganson was reminded that as per protocol, today's visit has been an evaluation only. We have not taken over the patient's controlled substance management.  Problem-specific plan: No problem-specific Assessment & Plan notes found for this encounter.  Lab Orders  No laboratory test(s) ordered today   Imaging Orders  No imaging studies ordered today   Referral Orders  No referral(s) requested today   Procedure Orders    No procedure(s) ordered today   Pharmacotherapy (current): Medications ordered:  No orders of the defined types were placed in this encounter.  Medications administered during this visit: Ms. Freedman had no medications administered during this visit.   Pharmacological management options:  Opioid Analgesics: The patient was informed that there is no guarantee that she would be a candidate for opioid analgesics. The decision will be made following CDC guidelines. This decision will be based on the results of diagnostic studies, as well as Ms. Jester's risk profile.   Membrane stabilizer: To be determined at a later time  Muscle relaxant: To be determined at a later time  NSAID: To be determined at a later time  Other analgesic(s): To be determined at a later time   Interventional management options: Ms. Finlayson was informed that there is no guarantee that she would be a candidate for interventional therapies. The decision will be based on the results of diagnostic studies, as well as Ms. Gritton's risk profile.   Procedure(s) under consideration:  ***   Provider-requested follow-up: No Follow-up on file.  Future Appointments Date Time Provider [place]  10/09/2016 8:30 AM Milinda Pointer, MD [facility] None  01/21/2017 11:00 AM Jerrol Banana., MD [facility] None    Primary Care Physician: Jerrol Banana., MD Location: [facility] Note by: Gaspar Cola, MD Date: 10/09/2016; Time: 7:18 PM

## 2016-10-08 NOTE — Telephone Encounter (Signed)
Does it matter the strength? (ex: 0.025% or 0.5%). Please advise. Thanks!

## 2016-10-08 NOTE — Telephone Encounter (Signed)
Sent in prescription into pharmacy.

## 2016-10-08 NOTE — Telephone Encounter (Signed)
Either OTC strength--.o5 if available OTC.

## 2016-10-09 ENCOUNTER — Ambulatory Visit: Payer: PPO | Attending: Pain Medicine | Admitting: Pain Medicine

## 2016-10-15 DIAGNOSIS — M48061 Spinal stenosis, lumbar region without neurogenic claudication: Secondary | ICD-10-CM | POA: Diagnosis not present

## 2016-11-02 DIAGNOSIS — M5416 Radiculopathy, lumbar region: Secondary | ICD-10-CM | POA: Diagnosis not present

## 2016-11-11 ENCOUNTER — Other Ambulatory Visit: Payer: Self-pay | Admitting: Family Medicine

## 2016-11-12 ENCOUNTER — Other Ambulatory Visit: Payer: Self-pay | Admitting: Family Medicine

## 2016-11-13 ENCOUNTER — Other Ambulatory Visit: Payer: Self-pay | Admitting: Family Medicine

## 2016-11-13 DIAGNOSIS — G2581 Restless legs syndrome: Secondary | ICD-10-CM

## 2016-11-13 MED ORDER — GABAPENTIN 400 MG PO CAPS: 400.0000 mg | ORAL_CAPSULE | Freq: Three times a day (TID) | ORAL | 3 refills | Status: DC

## 2016-11-13 NOTE — Telephone Encounter (Signed)
[facility] faxed refill request for the following medications:  gabapentin (NEURONTIN) 400 MG capsule 90 day supply  Last Rx: 11/13/15 LOV: 10/01/16 Please advise. Thanks TNP

## 2016-11-26 DIAGNOSIS — M5416 Radiculopathy, lumbar region: Secondary | ICD-10-CM | POA: Diagnosis not present

## 2016-12-14 DIAGNOSIS — M5416 Radiculopathy, lumbar region: Secondary | ICD-10-CM | POA: Diagnosis not present

## 2016-12-28 ENCOUNTER — Other Ambulatory Visit: Payer: Self-pay | Admitting: Family Medicine

## 2016-12-30 ENCOUNTER — Other Ambulatory Visit: Payer: Self-pay | Admitting: Family Medicine

## 2016-12-30 NOTE — Telephone Encounter (Signed)
Pt contacted office for refill request on the following medications:  amphetamine-dextroamphetamine (ADDERALL) 10 MG tablet  AT#[phone]/YH

## 2016-12-30 NOTE — Telephone Encounter (Signed)
Pharmacy requesting refills. Thanks!  

## 2016-12-31 MED ORDER — AMPHETAMINE-DEXTROAMPHETAMINE 10 MG PO TABS: ORAL_TABLET | ORAL | 0 refills | Status: DC

## 2017-01-01 ENCOUNTER — Inpatient Hospital Stay: Payer: PPO

## 2017-01-01 ENCOUNTER — Other Ambulatory Visit: Payer: Self-pay

## 2017-01-01 ENCOUNTER — Emergency Department: Payer: PPO

## 2017-01-01 ENCOUNTER — Encounter: Payer: Self-pay | Admitting: Emergency Medicine

## 2017-01-01 ENCOUNTER — Inpatient Hospital Stay
Admission: EM | Admit: 2017-01-01 | Discharge: 2017-01-05 | DRG: 947 | Disposition: A | Discharge status: Home / Self Care | Payer: PPO | Attending: Internal Medicine | Admitting: Internal Medicine

## 2017-01-01 ENCOUNTER — Telehealth: Payer: Self-pay

## 2017-01-01 DIAGNOSIS — R4701 Aphasia: Secondary | ICD-10-CM | POA: Diagnosis not present

## 2017-01-01 DIAGNOSIS — H409 Unspecified glaucoma: Secondary | ICD-10-CM | POA: Diagnosis not present

## 2017-01-01 DIAGNOSIS — Z8673 Personal history of transient ischemic attack (TIA), and cerebral infarction without residual deficits: Secondary | ICD-10-CM | POA: Diagnosis not present

## 2017-01-01 DIAGNOSIS — R410 Disorientation, unspecified: Principal | ICD-10-CM | POA: Diagnosis present

## 2017-01-01 DIAGNOSIS — J189 Pneumonia, unspecified organism: Secondary | ICD-10-CM | POA: Diagnosis not present

## 2017-01-01 DIAGNOSIS — Z86711 Personal history of pulmonary embolism: Secondary | ICD-10-CM | POA: Diagnosis not present

## 2017-01-01 DIAGNOSIS — Z90710 Acquired absence of both cervix and uterus: Secondary | ICD-10-CM | POA: Diagnosis not present

## 2017-01-01 DIAGNOSIS — T4275XA Adverse effect of unspecified antiepileptic and sedative-hypnotic drugs, initial encounter: Secondary | ICD-10-CM | POA: Diagnosis present

## 2017-01-01 DIAGNOSIS — F319 Bipolar disorder, unspecified: Secondary | ICD-10-CM | POA: Diagnosis not present

## 2017-01-01 DIAGNOSIS — R000 Tachycardia, unspecified: Secondary | ICD-10-CM | POA: Diagnosis present

## 2017-01-01 DIAGNOSIS — Z87891 Personal history of nicotine dependence: Secondary | ICD-10-CM | POA: Diagnosis not present

## 2017-01-01 DIAGNOSIS — R0902 Hypoxemia: Secondary | ICD-10-CM | POA: Diagnosis not present

## 2017-01-01 DIAGNOSIS — Z79899 Other long term (current) drug therapy: Secondary | ICD-10-CM | POA: Diagnosis not present

## 2017-01-01 DIAGNOSIS — F411 Generalized anxiety disorder: Secondary | ICD-10-CM | POA: Diagnosis present

## 2017-01-01 DIAGNOSIS — F419 Anxiety disorder, unspecified: Secondary | ICD-10-CM | POA: Diagnosis not present

## 2017-01-01 DIAGNOSIS — Z888 Allergy status to other drugs, medicaments and biological substances status: Secondary | ICD-10-CM | POA: Diagnosis not present

## 2017-01-01 DIAGNOSIS — R4781 Slurred speech: Secondary | ICD-10-CM | POA: Diagnosis not present

## 2017-01-01 DIAGNOSIS — M25552 Pain in left hip: Secondary | ICD-10-CM | POA: Diagnosis present

## 2017-01-01 DIAGNOSIS — R471 Dysarthria and anarthria: Secondary | ICD-10-CM | POA: Diagnosis present

## 2017-01-01 DIAGNOSIS — R05 Cough: Secondary | ICD-10-CM | POA: Diagnosis not present

## 2017-01-01 LAB — URINALYSIS, COMPLETE (UACMP) WITH MICROSCOPIC
BACTERIA UA: NONE SEEN
Bilirubin Urine: NEGATIVE
GLUCOSE, UA: NEGATIVE mg/dL
Ketones, ur: NEGATIVE mg/dL
Leukocytes, UA: NEGATIVE
NITRITE: NEGATIVE
PH: 7 (ref 5.0–8.0)
Protein, ur: NEGATIVE mg/dL
SPECIFIC GRAVITY, URINE: 1.008 (ref 1.005–1.030)
SQUAMOUS EPITHELIAL / LPF: NONE SEEN
WBC, UA: NONE SEEN WBC/hpf (ref 0–5)

## 2017-01-01 LAB — COMPREHENSIVE METABOLIC PANEL WITH GFR
ALT: 20 U/L (ref 14–54)
AST: 28 U/L (ref 15–41)
Albumin: 4.2 g/dL (ref 3.5–5.0)
Alkaline Phosphatase: 83 U/L (ref 38–126)
Anion gap: 12 (ref 5–15)
BUN: 13 mg/dL (ref 6–20)
CO2: 23 mmol/L (ref 22–32)
Calcium: 9.5 mg/dL (ref 8.9–10.3)
Chloride: 105 mmol/L (ref 101–111)
Creatinine, Ser: 0.68 mg/dL (ref 0.44–1.00)
GFR calc Af Amer: >60 mL/min
GFR calc non Af Amer: >60 mL/min
Glucose, Bld: 108 mg/dL — ABNORMAL HIGH (ref 65–99)
Potassium: 3.5 mmol/L (ref 3.5–5.1)
Sodium: 140 mmol/L (ref 135–145)
Total Bilirubin: 1.2 mg/dL (ref 0.3–1.2)
Total Protein: 7.9 g/dL (ref 6.5–8.1)

## 2017-01-01 LAB — TROPONIN I: Troponin I: <0.03 ng/mL

## 2017-01-01 LAB — CBC
HEMATOCRIT: 46 % (ref 35.0–47.0)
HEMOGLOBIN: 15.5 g/dL (ref 12.0–16.0)
MCH: 30 pg (ref 26.0–34.0)
MCHC: 33.8 g/dL (ref 32.0–36.0)
MCV: 88.9 fL (ref 80.0–100.0)
Platelets: 257 10*3/uL (ref 150–440)
RBC: 5.18 MIL/uL (ref 3.80–5.20)
RDW: 14.2 % (ref 11.5–14.5)
WBC: 6.1 10*3/uL (ref 3.6–11.0)

## 2017-01-01 LAB — DIFFERENTIAL
Basophils Absolute: 0.1 K/uL (ref 0–0.1)
Basophils Relative: 1 %
Eosinophils Absolute: 0.1 K/uL (ref 0–0.7)
Eosinophils Relative: 2 %
Lymphocytes Relative: 35 %
Lymphs Abs: 2.1 K/uL (ref 1.0–3.6)
Monocytes Absolute: 0.6 K/uL (ref 0.2–0.9)
Monocytes Relative: 11 %
Neutro Abs: 3.1 K/uL (ref 1.4–6.5)
Neutrophils Relative %: 51 %

## 2017-01-01 LAB — PROTIME-INR
INR: 0.97
Prothrombin Time: 12.8 seconds (ref 11.4–15.2)

## 2017-01-01 LAB — APTT: aPTT: 27 s (ref 24–36)

## 2017-01-01 MED ORDER — ACETAMINOPHEN 650 MG RE SUPP
650.0000 mg | Freq: Four times a day (QID) | RECTAL | Status: DC | PRN
  Filled 2017-01-01 (×2): qty 1

## 2017-01-01 MED ORDER — BUPROPION HCL ER (SR) 150 MG PO TB12
150.0000 mg | ORAL_TABLET | Freq: Every day | ORAL | Status: DC
  Administered 2017-01-02 – 2017-01-04 (×3): 150 mg via ORAL
  Filled 2017-01-01 (×6): qty 1

## 2017-01-01 MED ORDER — SODIUM CHLORIDE 0.9 % IV SOLN: 250.0000 mL | INTRAVENOUS | Status: DC | PRN

## 2017-01-01 MED ORDER — ENOXAPARIN SODIUM 40 MG/0.4ML ~~LOC~~ SOLN
40.0000 mg | Freq: Two times a day (BID) | SUBCUTANEOUS | Status: DC
  Administered 2017-01-01 – 2017-01-05 (×8): 40 mg via SUBCUTANEOUS
  Filled 2017-01-01 (×8): qty 0.4

## 2017-01-01 MED ORDER — PROMETHAZINE HCL 25 MG PO TABS
25.0000 mg | ORAL_TABLET | Freq: Four times a day (QID) | ORAL | Status: DC | PRN
  Administered 2017-01-03: 25 mg via ORAL
  Filled 2017-01-01: qty 1

## 2017-01-01 MED ORDER — ACETAMINOPHEN 325 MG PO TABS
650.0000 mg | ORAL_TABLET | Freq: Four times a day (QID) | ORAL | Status: DC | PRN
  Administered 2017-01-01 – 2017-01-04 (×5): 650 mg via ORAL
  Filled 2017-01-01 (×5): qty 2

## 2017-01-01 MED ORDER — ARIPIPRAZOLE 15 MG PO TABS
30.0000 mg | ORAL_TABLET | Freq: Every day | ORAL | Status: DC
  Administered 2017-01-02 – 2017-01-05 (×4): 30 mg via ORAL
  Filled 2017-01-01 (×4): qty 2

## 2017-01-01 MED ORDER — IOPAMIDOL (ISOVUE-370) INJECTION 76%
75.0000 mL | Freq: Once | INTRAVENOUS | Status: AC | PRN
  Administered 2017-01-01: 75 mL via INTRAVENOUS

## 2017-01-01 MED ORDER — ASPIRIN EC 81 MG PO TBEC
81.0000 mg | DELAYED_RELEASE_TABLET | Freq: Every day | ORAL | Status: DC
  Administered 2017-01-01 – 2017-01-05 (×5): 81 mg via ORAL
  Filled 2017-01-01 (×5): qty 1

## 2017-01-01 MED ORDER — DILTIAZEM LOAD VIA INFUSION
5.0000 mg | Freq: Once | INTRAVENOUS | Status: AC
Start: 2017-01-01 — End: 2017-01-01
  Administered 2017-01-01: 5 mg via INTRAVENOUS
  Filled 2017-01-01: qty 5

## 2017-01-01 MED ORDER — LATANOPROST 0.005 % OP SOLN
1.0000 [drp] | Freq: Every day | OPHTHALMIC | Status: DC
  Administered 2017-01-03 – 2017-01-04 (×2): 1 [drp] via OPHTHALMIC
  Filled 2017-01-01: qty 2.5

## 2017-01-01 MED ORDER — DEXTROSE 5 % IV SOLN
5.0000 mg/h | INTRAVENOUS | Status: DC
  Administered 2017-01-01 – 2017-01-03 (×3): 5 mg/h via INTRAVENOUS
  Filled 2017-01-01 (×3): qty 100

## 2017-01-01 MED ORDER — DILTIAZEM HCL 60 MG PO TABS
60.0000 mg | ORAL_TABLET | Freq: Three times a day (TID) | ORAL | Status: DC
  Administered 2017-01-01 – 2017-01-05 (×12): 60 mg via ORAL
  Filled 2017-01-01 (×3): qty 1
  Filled 2017-01-01: qty 2
  Filled 2017-01-01: qty 1
  Filled 2017-01-01: qty 2
  Filled 2017-01-01: qty 1
  Filled 2017-01-01 (×2): qty 2
  Filled 2017-01-01: qty 1
  Filled 2017-01-01: qty 2
  Filled 2017-01-01 (×3): qty 1
  Filled 2017-01-01 (×3): qty 2
  Filled 2017-01-01 (×3): qty 1
  Filled 2017-01-01: qty 2
  Filled 2017-01-01: qty 1
  Filled 2017-01-01: qty 2

## 2017-01-01 MED ORDER — ONDANSETRON HCL 4 MG PO TABS: 4.0000 mg | ORAL_TABLET | Freq: Four times a day (QID) | ORAL | Status: DC | PRN

## 2017-01-01 MED ORDER — NITROGLYCERIN 0.4 MG SL SUBL: 0.4000 mg | SUBLINGUAL_TABLET | SUBLINGUAL | Status: DC | PRN

## 2017-01-01 MED ORDER — MORPHINE SULFATE (PF) 2 MG/ML IV SOLN
2.0000 mg | INTRAVENOUS | Status: DC | PRN
  Administered 2017-01-01 – 2017-01-02 (×3): 2 mg via INTRAVENOUS
  Filled 2017-01-01 (×3): qty 1

## 2017-01-01 MED ORDER — SODIUM CHLORIDE 0.9% FLUSH: 3.0000 mL | INTRAVENOUS | Status: DC | PRN

## 2017-01-01 MED ORDER — AMPHETAMINE-DEXTROAMPHETAMINE 5 MG PO TABS
10.0000 mg | ORAL_TABLET | Freq: Every day | ORAL | Status: DC
  Administered 2017-01-02 – 2017-01-05 (×4): 10 mg via ORAL
  Filled 2017-01-01 (×4): qty 2

## 2017-01-01 MED ORDER — CAPSAICIN 0.025 % EX CREA
TOPICAL_CREAM | Freq: Three times a day (TID) | CUTANEOUS | Status: DC | PRN
  Filled 2017-01-01: qty 60

## 2017-01-01 MED ORDER — DEXTROSE 5 % IV SOLN
INTRAVENOUS | Status: DC
  Administered 2017-01-01 – 2017-01-03 (×3): via INTRAVENOUS
  Filled 2017-01-01 (×4): qty 10

## 2017-01-01 MED ORDER — CEFTRIAXONE SODIUM IN DEXTROSE 20 MG/ML IV SOLN
1.0000 g | Freq: Once | INTRAVENOUS | Status: AC
  Administered 2017-01-01: 1 g via INTRAVENOUS
  Filled 2017-01-01: qty 50

## 2017-01-01 MED ORDER — GABAPENTIN 400 MG PO CAPS
400.0000 mg | ORAL_CAPSULE | Freq: Three times a day (TID) | ORAL | Status: DC
  Administered 2017-01-01 – 2017-01-05 (×11): 400 mg via ORAL
  Filled 2017-01-01 (×11): qty 1

## 2017-01-01 MED ORDER — CEFTRIAXONE SODIUM IN DEXTROSE 20 MG/ML IV SOLN: 1.0000 g | INTRAVENOUS | Status: DC

## 2017-01-01 MED ORDER — CLONAZEPAM 1 MG PO TABS: 1.0000 mg | ORAL_TABLET | Freq: Three times a day (TID) | ORAL | Status: DC | PRN

## 2017-01-01 MED ORDER — TEMAZEPAM 15 MG PO CAPS
30.0000 mg | ORAL_CAPSULE | Freq: Every day | ORAL | Status: DC
  Administered 2017-01-03: 30 mg via ORAL
  Filled 2017-01-01: qty 2

## 2017-01-01 MED ORDER — SODIUM CHLORIDE 0.9% FLUSH
3.0000 mL | Freq: Two times a day (BID) | INTRAVENOUS | Status: DC
  Administered 2017-01-01 – 2017-01-04 (×7): 3 mL via INTRAVENOUS

## 2017-01-01 MED ORDER — ASPIRIN 81 MG PO CHEW
81.0000 mg | CHEWABLE_TABLET | Freq: Every day | ORAL | Status: DC
  Filled 2017-01-01: qty 1

## 2017-01-01 MED ORDER — CITALOPRAM HYDROBROMIDE 20 MG PO TABS
40.0000 mg | ORAL_TABLET | Freq: Every day | ORAL | Status: DC
  Administered 2017-01-02: 40 mg via ORAL
  Filled 2017-01-01: qty 2

## 2017-01-01 MED ORDER — ONDANSETRON HCL 4 MG/2ML IJ SOLN
4.0000 mg | Freq: Four times a day (QID) | INTRAMUSCULAR | Status: DC | PRN
  Administered 2017-01-02 – 2017-01-03 (×2): 4 mg via INTRAVENOUS
  Filled 2017-01-01 (×3): qty 2

## 2017-01-01 MED ORDER — DEXTROSE 5 % IV SOLN
500.0000 mg | INTRAVENOUS | Status: AC
  Administered 2017-01-02 – 2017-01-04 (×3): 500 mg via INTRAVENOUS
  Filled 2017-01-01 (×3): qty 500

## 2017-01-01 MED ORDER — AZITHROMYCIN 500 MG IV SOLR
500.0000 mg | Freq: Once | INTRAVENOUS | Status: AC
  Administered 2017-01-02: 500 mg via INTRAVENOUS
  Filled 2017-01-01: qty 500

## 2017-01-01 NOTE — ED Triage Notes (Signed)
Pt comes into the ED via POV c/o slurred speech, lethargy, and hot flashes that started at 2:00 this morning.  Patient has even and unlabored respirations.  H/o of a stroke, aneurysm, and has a stent placed.  Patient has not been feeling well for a week.  Patient's husband states that the last time these symptoms occurred, she had a bladder infection.

## 2017-01-01 NOTE — Progress Notes (Signed)
Anticoagulation monitoring(Lovenox):  72 yo female ordered Lovenox 40 mg Q24h  Filed Weights   01/01/17 1338  Weight: 220 lb (99.8 kg)   BMI 41.59   Lab Results  Component Value Date   CREATININE 0.68 01/01/2017   CREATININE 0.88 04/24/2016   CREATININE 0.77 07/31/2015   Estimated Creatinine Clearance: 69.9 mL/min (by C-G formula based on SCr of 0.68 mg/dL). Hemoglobin & Hematocrit     Component Value Date/Time   HGB 15.5 01/01/2017 1339   HGB 15.8 04/24/2016 1542   HCT 46.0 01/01/2017 1339   HCT 45.3 04/24/2016 1542     Per Protocol for Patient with estCrcl > 30 ml/min and BMI > 40, will transition to Lovenox 40 mg Q12h.

## 2017-01-01 NOTE — ED Notes (Signed)
Patient transported to CT 

## 2017-01-01 NOTE — H&P (Signed)
Gail Hardy at [place] NAME: Gail Hardy    MR#:  [id number]  DATE OF BIRTH:  Apr 28, 1945  DATE OF ADMISSION:  01/01/2017  PRIMARY CARE PHYSICIAN: Jerrol Banana., MD   REQUESTING/REFERRING PHYSICIAN: Conni Slipper MD  CHIEF COMPLAINT:   Chief Complaint  Patient presents with  . Aphasia    HISTORY OF PRESENT ILLNESS: Gail Hardy  is a 71 y.o. female with a known history of brain aneurysm in the past, depression, glaucoma, hyperlipidemia who was brought to the hospital with slurred speech.  She is also been very weak according to her husband.  Patient according to the ED physician when ambulated her oxygen drops.  Also her heart rate is been elevated into the 120s.  According to the patient and her husband she has not been able to walk much due to hip pain.  She has been having a cough productive of white sputum.  She denies any chest pain or palpitations.  Denies any other asymmetrical weakness.  Denies any visual difficulties.  PAST MEDICAL HISTORY:   Past Medical History:  Diagnosis Date  . Anemia   . Aneurysm ([place]) 2011  . Blood transfusion without reported diagnosis 2013,2014   x 2  . Cancer ([place])   . Depression   . Family history of adverse reaction to anesthesia    daughter nausea and vomiting  . Glaucoma   . Hernia   . Hyperlipidemia   . Pulmonary emboli ([place]) 2011  . Stroke [facility]) 2010    PAST SURGICAL HISTORY:  Past Surgical History:  Procedure Laterality Date  . ABDOMINAL HYSTERECTOMY    . BREAST EXCISIONAL BIOPSY Right    pt states she had "lumps removed from breast as a teen"  . CEREBRAL ANEURYSM REPAIR  2011   stent and coil insertion  . CHOLECYSTECTOMY    . COLONOSCOPY  2013   [facility]  . HERNIA REPAIR  2014   lap paraesophageal repair  . INTRAMEDULLARY (IM) NAIL INTERTROCHANTERIC Left 10/19/2014   Procedure: INTRAMEDULLARY (IM) NAIL INTERTROCHANTRIC;  Surgeon: Earnestine Leys, MD;  Location: [facility] ORS;   Service: Orthopedics;  Laterality: Left;  . UPPER GI ENDOSCOPY  2013, 2014   [place]    SOCIAL HISTORY:  Social History   Tobacco Use  . Smoking status: Former Smoker    Years: 10.00    Types: Cigarettes    Last attempt to quit: 02/10/1989    Years since quitting: 27.9  . Smokeless tobacco: Never Used  Substance Use Topics  . Alcohol use: No    FAMILY HISTORY:  Family History  Problem Relation Age of Onset  . Emphysema Father   . COPD Mother   . Heart disease Mother   . Heart disease Brother   . Dementia Brother   . Liver disease Brother   . Heart disease Brother     DRUG ALLERGIES:  Allergies  Allergen Reactions  . Diazepam     Other reaction(s): Feeling agitated (finding)    REVIEW OF SYSTEMS:   CONSTITUTIONAL: No fever, positive fatigue and positive weakness EYES: No blurred or double vision.  EARS, NOSE, AND THROAT: No tinnitus or ear pain.  RESPIRATORY: No cough, shortness of breath, wheezing or hemoptysis.  CARDIOVASCULAR: No chest pain, orthopnea, edema.  GASTROINTESTINAL: No nausea, vomiting, diarrhea or abdominal pain.  GENITOURINARY: No dysuria, hematuria.  ENDOCRINE: No polyuria, nocturia,  HEMATOLOGY: No anemia, easy bruising or bleeding SKIN: No rash or lesion. MUSCULOSKELETAL: No joint  pain or arthritis.   NEUROLOGIC: No tingling, numbness, weakness.  Positive slurred speech PSYCHIATRY: No anxiety or depression.   MEDICATIONS AT HOME:  Prior to Admission medications   Medication Sig Start Date End Date Taking? Authorizing Provider  acetaminophen-codeine (TYLENOL #3) 300-30 MG tablet  09/28/16  Yes [provider]  amphetamine-dextroamphetamine (ADDERALL) 10 MG tablet TAKE 1 TABLET BY MOUTH TWICE (2) DAILY 12/31/16  Yes Jerrol Banana., MD  ARIPiprazole (ABILIFY) 30 MG tablet TAKE 1 TABLET BY MOUTH ONCE DAILY 09/05/16  Yes Jerrol Banana., MD  buPROPion [facility] SR) 150 MG 12 hr tablet TAKE 1 TABLET BY MOUTH ONCE A DAY  09/05/16  Yes Jerrol Banana., MD  clonazePAM (KLONOPIN) 1 MG tablet TAKE 1 TABLET BY MOUTH EVERY 8 HOURS AS NEEDED 12/31/16  Yes Jerrol Banana., MD  gabapentin (NEURONTIN) 400 MG capsule Take 1 capsule (400 mg total) by mouth 3 (three) times daily. 11/13/16  Yes Jerrol Banana., MD  latanoprost (XALATAN) 0.005 % ophthalmic solution Place 1 drop into both eyes at bedtime.  08/01/12  Yes [provider]  temazepam (RESTORIL) 30 MG capsule Take 1 capsule (30 mg total) by mouth daily. 07/25/16  Yes Jerrol Banana., MD  capsaicin (ZOSTRIX) 0.025 % cream Apply topically 3 (three) times daily. 10/08/16   Jerrol Banana., MD  citalopram (CELEXA) 40 MG tablet TAKE 1 TABLET BY MOUTH ONCE A DAY Patient not taking: Reported on 01/01/2017 05/27/16   Jerrol Banana., MD  promethazine (PHENERGAN) 25 MG tablet TAKE 1/2 TO 1 TABLET BY MOUTH EVERY 6 HOURS AS NEEDED 08/20/16   Jerrol Banana., MD      PHYSICAL EXAMINATION:   VITAL SIGNS: Blood pressure (!) 152/81, pulse (!) 118, temperature 98.1 F (36.7 C), temperature source Oral, resp. rate 20, height 5\' 1"  (1.549 m), weight 220 lb (99.8 kg), SpO2 97 %.  GENERAL:  71 y.o.-year-old patient lying in the bed with no acute distress.  Appears chronically ill EYES: Pupils equal, round, reactive to light and accommodation. No scleral icterus. Extraocular muscles intact.  HEENT: Head atraumatic, normocephalic. Oropharynx and nasopharynx clear.  NECK:  Supple, no jugular venous distention. No thyroid enlargement, no tenderness.  LUNGS: Normal breath sounds bilaterally, no wheezing, rales,rhonchi or crepitation. No use of accessory muscles of respiration.  CARDIOVASCULAR: S1, S2 tachycardic. No murmurs, rubs, or gallops.  ABDOMEN: Soft, nontender, nondistended. Bowel sounds present. No organomegaly or mass.  EXTREMITIES: No pedal edema, cyanosis, or clubbing.  NEUROLOGIC: Cranial nerves II through XII are intact.  Muscle strength 5/5 in all extremities. Sensation intact. Gait not checked.  PSYCHIATRIC: The patient is alert and oriented x 3.  SKIN: No obvious rash, lesion, or ulcer.   LABORATORY PANEL:   CBC Recent Labs  Lab 01/01/17 1339  WBC 6.1  HGB 15.5  HCT 46.0  PLT 257  MCV 88.9  MCH 30.0  MCHC 33.8  RDW 14.2  LYMPHSABS 2.1  MONOABS 0.6  EOSABS 0.1  BASOSABS 0.1   ------------------------------------------------------------------------------------------------------------------  Chemistries  Recent Labs  Lab 01/01/17 1339  NA 140  K 3.5  CL 105  CO2 23  GLUCOSE 108*  BUN 13  CREATININE 0.68  CALCIUM 9.5  AST 28  ALT 20  ALKPHOS 83  BILITOT 1.2   ------------------------------------------------------------------------------------------------------------------ estimated creatinine clearance is 69.9 mL/min (by C-G formula based on SCr of 0.68 mg/dL). ------------------------------------------------------------------------------------------------------------------ No results for input(s): TSH, T4TOTAL, T3FREE, THYROIDAB  in the last 72 hours.  Invalid input(s): FREET3   Coagulation profile Recent Labs  Lab 01/01/17 1339  INR 0.97   ------------------------------------------------------------------------------------------------------------------- No results for input(s): DDIMER in the last 72 hours. -------------------------------------------------------------------------------------------------------------------  Cardiac Enzymes Recent Labs  Lab 01/01/17 1339  TROPONINI <0.03   ------------------------------------------------------------------------------------------------------------------ Invalid input(s): POCBNP  ---------------------------------------------------------------------------------------------------------------  Urinalysis    Component Value Date/Time   COLORURINE STRAW (A) 01/01/2017 1400   APPEARANCEUR CLEAR (A) 01/01/2017 1400    LABSPEC 1.008 01/01/2017 1400   PHURINE 7.0 01/01/2017 1400   GLUCOSEU NEGATIVE 01/01/2017 1400   HGBUR SMALL (A) 01/01/2017 1400   BILIRUBINUR NEGATIVE 01/01/2017 1400   KETONESUR NEGATIVE 01/01/2017 1400   PROTEINUR NEGATIVE 01/01/2017 1400   NITRITE NEGATIVE 01/01/2017 1400   LEUKOCYTESUR NEGATIVE 01/01/2017 1400     RADIOLOGY: Dg Chest 2 View  Result Date: 01/01/2017 CLINICAL DATA:  Delays for week, slurred speech, lethargy, and heart flashes. Similar symptoms in the past when the patient had a bladder infection. EXAM: CHEST  2 VIEW COMPARISON:  PA and lateral chest x-ray of July 30, 2010 FINDINGS: The lungs are mildly hypoinflated. There is patchy increased density in the retrocardiac region likely on the left. The cardiac silhouette is enlarged. The pulmonary vascularity is not clearly engorged. There is no pleural effusion. The observed bony thorax is unremarkable. IMPRESSION: Probable left lower lobe atelectasis or pneumonia.  No overt CHF. Electronically Signed   By: David  Martinique M.D.   On: 01/01/2017 14:35   Ct Head Wo Contrast  Result Date: 01/01/2017 CLINICAL DATA:  Slurred speech and lethargy. EXAM: CT HEAD WITHOUT CONTRAST TECHNIQUE: Contiguous axial images were obtained from the base of the skull through the vertex without intravenous contrast. COMPARISON:  08/04/2013 FINDINGS: Brain: There is no evidence for acute hemorrhage, hydrocephalus, mass lesion, or abnormal extra-axial fluid collection. No definite CT evidence for acute infarction. Vascular: Vascular coils identified in the region of the right internal carotid artery, stable. Internal carotid stent device noted on bone windows. Skull: No evidence for fracture. No worrisome lytic or sclerotic lesion. Sinuses/Orbits: Polypoid mucosal disease noted right maxillary sinus. Visualized portions of the globes and intraorbital fat are unremarkable. Other: None. IMPRESSION: 1. No acute intracranial abnormality. 2. Stable  appearance right internal carotid stent with evidence of aneurysm coils. Electronically Signed   By: Misty Stanley M.D.   On: 01/01/2017 14:29    EKG: Orders placed or performed during the hospital encounter of 01/01/17  . ED EKG  . ED EKG  . EKG 12-Lead  . EKG 12-Lead    IMPRESSION AND PLAN: Patient is a 71 year old white female with previous history of stroke and a brain aneurysm is presenting with slurred speech noted to be tachycardic and some hypoxia with activity  1.  Slurred speech I will obtain MRI of the brain to rule out a stroke CT scan of the head is negative Treat patient with aspirin for now If her MRI is positive then would do a stroke workup  2.  Possible pneumonia based on her chest x-ray Patient has a history of pulmonary embolism she is currently tachycardic he is not very ambulatory I will obtain CT scan of the chest to rule out PE Swallow eval  3.  Sinus tachycardia Will treat underlying condition We will place her on telemetry monitoring Low-dose Cardizem for now  4.  Generalized anxiety disorder continue therapy with Wellbutrin Celexa  5.  DVT prophylaxis Lovenox for dvt proph    All the records  are reviewed and case discussed with ED provider. Management plans discussed with the patient, family and they are in agreement.  CODE STATUS: Code Status History    Date Active Date Inactive Code Status Order ID Comments User Context   10/19/2014 12:01 10/23/2014 17:46 Full Code [id number]  Earnestine Leys, MD Inpatient    Advance Directive Documentation     Most Recent Value  Type of Advance Directive  Living will  Pre-existing out of facility DNR order (yellow form or pink MOST form)  No data  "MOST" Form in Place?  No data       TOTAL TIME TAKING CARE OF THIS PATIENT:55 minutes.    Dustin Flock M.D on 01/01/2017 at 4:12 PM  Between 7am to 6pm - Pager - [phone]  After 6pm go to [email or web address] - password [email or web address] [facility]  [facility]  Office  [phone]  CC: Primary care physician; Jerrol Banana., MD

## 2017-01-01 NOTE — ED Provider Notes (Signed)
[facility] Emergency Department Provider Note   ____________________________________________   First MD Initiated Contact with Patient 01/01/17 1350     (approximate)  I have reviewed the triage vital signs and the nursing notes.   HISTORY  Chief Complaint Aphasia    HPI Gail Hardy is a 71 y.o. female Who is noted to have a slurry speech starting about 2:00 this morning. She had this once previously when she had a UTI. She's been having a slight cough for the last few days. She does not feel short of breath. She does not have any other focal neurological findings besides the slurry speech. She does have a history of aneurysm in coiling.   Past Medical History:  Diagnosis Date  . Anemia   . Aneurysm ([place]) 2011  . Blood transfusion without reported diagnosis 2013,2014   x 2  . Cancer ([place])   . Depression   . Family history of adverse reaction to anesthesia    daughter nausea and vomiting  . Glaucoma   . Hernia   . Hyperlipidemia   . Pulmonary emboli ([place]) 2011  . Stroke [facility]) 2010    Patient Active Problem List   Diagnosis Date Noted  . History of calcium pyrophosphate deposition disease (CPPD) 10/08/2016  . Pseudogout involving multiple joints 10/08/2016  . Osteoarthritis 10/08/2016  . Lumbar facet hypertrophy 10/08/2016  . Spondylosis of lumbar spine 10/08/2016  . Chronic low back pain (Bilateral) 10/08/2016  . Chronic shoulder pain (Left) 10/08/2016  . Long term prescription benzodiazepine use 10/08/2016  . Long term prescription opiate use 10/08/2016  . Long term (current) use of opiate analgesic 10/08/2016  . Opiate use 10/08/2016  . Chronic knee pain (Left) 10/08/2016  . Osteoarthritis of knee (Left) 10/08/2016  . Chronic hip pain (Left) 10/08/2016  . Osteoarthritis of hip (Left) 10/08/2016  . Chronic pain syndrome 10/08/2016  . Disturbance of skin sensation 10/08/2016  . Cerebrovascular accident (CVA) ([place]) 03/15/2015  .  Pressure ulcer 10/22/2014  . Stress fracture of hip with delayed healing 10/19/2014  . Allergic rhinitis 09/20/2014  . Anxiety disorder 09/20/2014  . Absolute anemia 09/20/2014  . Bipolar 1 disorder, depressed ([place]) 09/20/2014  . Body mass index of 60 or higher 09/20/2014  . Anemia due to blood loss 09/20/2014  . Aneurysm, cerebral 09/20/2014  . Chronic headache 09/20/2014  . Claustrophobia 09/20/2014  . Cerebral vascular accident ([place]) 09/20/2014  . Acute confusion due to medical condition 09/20/2014  . Clinical depression 09/20/2014  . Abnormal liver enzymes 09/20/2014  . 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 Diaphragmatic hernia 09/20/2014  . Gastric erosion 09/20/2014  . Enterogastritis 09/20/2014  . 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 Hypercholesteremia 09/20/2014  . Cannot sleep 09/20/2014  . Malaise and fatigue 09/20/2014  . Bad memory 09/20/2014  . Altered blood in stool 09/20/2014  . Candida glabrata infection 09/20/2014  . Decreased motor strength 09/20/2014  . Adiposity 09/20/2014  . Excess weight 09/20/2014  . Paralysis agitans ([place]) 09/20/2014  . Breakdown 09/20/2014  . PE (pulmonary embolism) 09/20/2014  . Hypercholesterolemia without hypertriglyceridemia 09/20/2014  . Restless leg 09/20/2014  . Neuralgia neuritis, sciatic nerve 09/20/2014  . Gastric ulcer 09/20/2014  . Fast heart beat 09/20/2014  . Temporary cerebral vascular dysfunction 09/20/2014  . Has a tremor 09/20/2014  . Adynamia 09/20/2014  . Classical migraine with intractable migraine 08/10/2013  . Dysphagia, unspecified(787.20) 08/05/2012  . Paraesophageal hernia 08/04/2012    Past Surgical History:  Procedure Laterality Date  .  ABDOMINAL HYSTERECTOMY    . BREAST EXCISIONAL BIOPSY Right    pt states she had "lumps removed from breast as a teen"  . CEREBRAL ANEURYSM REPAIR  2011   stent and coil insertion  . CHOLECYSTECTOMY    . COLONOSCOPY  2013   [facility]  . HERNIA REPAIR   2014   lap paraesophageal repair  . INTRAMEDULLARY (IM) NAIL INTERTROCHANTERIC Left 10/19/2014   Procedure: INTRAMEDULLARY (IM) NAIL INTERTROCHANTRIC;  Surgeon: Earnestine Leys, MD;  Location: [facility] ORS;  Service: Orthopedics;  Laterality: Left;  . UPPER GI ENDOSCOPY  2013, 2014   [name]    Prior to Admission medications   Medication Sig Start Date End Date Taking? Authorizing Provider  acetaminophen-codeine (TYLENOL #3) 300-30 MG tablet  09/28/16   [provider]  amphetamine-dextroamphetamine (ADDERALL) 10 MG tablet TAKE 1 TABLET BY MOUTH TWICE (2) DAILY 12/31/16   Jerrol Banana., MD  ARIPiprazole (ABILIFY) 30 MG tablet TAKE 1 TABLET BY MOUTH ONCE DAILY 09/05/16   Jerrol Banana., MD  buPROPion [facility] SR) 150 MG 12 hr tablet TAKE 1 TABLET BY MOUTH ONCE A DAY 09/05/16   Jerrol Banana., MD  capsaicin (ZOSTRIX) 0.025 % cream Apply topically 3 (three) times daily. 10/08/16   Jerrol Banana., MD  citalopram (CELEXA) 40 MG tablet TAKE 1 TABLET BY MOUTH ONCE A DAY 05/27/16   Jerrol Banana., MD  clonazePAM (KLONOPIN) 1 MG tablet TAKE 1 TABLET BY MOUTH EVERY 8 HOURS AS NEEDED 12/31/16   Jerrol Banana., MD  gabapentin (NEURONTIN) 400 MG capsule Take 1 capsule (400 mg total) by mouth 3 (three) times daily. 11/13/16   Jerrol Banana., MD  latanoprost (XALATAN) 0.005 % ophthalmic solution Place 1 drop into both eyes at bedtime.  08/01/12   [provider]  promethazine (PHENERGAN) 25 MG tablet TAKE 1/2 TO 1 TABLET BY MOUTH EVERY 6 HOURS AS NEEDED 08/20/16   Jerrol Banana., MD  temazepam (RESTORIL) 30 MG capsule Take 1 capsule (30 mg total) by mouth daily. 07/25/16   Jerrol Banana., MD    Allergies Diazepam  Family History  Problem Relation Age of Onset  . Emphysema Father   . COPD Mother   . Heart disease Mother   . Heart disease Brother   . Dementia Brother   . Liver disease Brother   . Heart disease Brother      Social History Social History   Tobacco Use  . Smoking status: Former Smoker    Years: 10.00    Types: Cigarettes    Last attempt to quit: 02/10/1989    Years since quitting: 27.9  . Smokeless tobacco: Never Used  Substance Use Topics  . Alcohol use: No  . Drug use: No    Review of Systems  Constitutional: No fever/chills Eyes: No visual changes. ENT: No sore throat. Cardiovascular: Denies chest pain. Respiratory: Denies shortness of breath. Gastrointestinal: No abdominal pain.  No nausea, no vomiting.  No diarrhea.  No constipation. Genitourinary: Negative for dysuria. Musculoskeletal: Negative for back pain. Skin: Negative for rash. Neurological: Negative for headaches, New focal weakness ____________________________________________   PHYSICAL EXAM:  VITAL SIGNS: ED Triage Vitals  Enc Vitals Group     BP 01/01/17 1338 (!) 152/118     Pulse Rate 01/01/17 1338 (!) 130     Resp 01/01/17 1338 (!) 22     Temp 01/01/17 1338 98.1 F (36.7 C)  Temp Source 01/01/17 1338 Oral     SpO2 01/01/17 1338 92 %     Weight 01/01/17 1338 220 lb (99.8 kg)     Height 01/01/17 1338 5\' 1"  (1.549 m)     Head Circumference --      Peak Flow --      Pain Score 01/01/17 1337 10     Pain Loc --      Pain Edu? --      Excl. in [place]? --     Constitutional: Alert and oriented. Well appearing and in no acute distress. Eyes: Conjunctivae are normal. PER. EOMI. Head: Atraumatic. Nose: No congestion/rhinnorhea. Mouth/Throat: Mucous membranes are moist.  Oropharynx non-erythematous. Neck: No stridor. Cardiovascular: rapid rate, regular rhythm. Grossly normal heart sounds.  Good peripheral circulation. Respiratory: Normal respiratory effort.  No retractions. Lungs CTAB. Gastrointestinal: Soft and nontender. No distention. No abdominal bruits. No CVA tenderness. Musculoskeletal: No lower extremity tenderness nor edema.  No joint effusions. Neurologic:  Normal speech and language. no  new gross neurological defects except for the slurry speech. She does have unilateral weakness which is left over from her old aneurysm clipping. Skin:  Skin is warm, dry and intact. No rash noted. Psychiatric: Mood and affect are normal. Speech and behavior are normal.  ____________________________________________   LABS (all labs ordered are listed, but only abnormal results are displayed)  Labs Reviewed  COMPREHENSIVE METABOLIC PANEL - Abnormal; Notable for the following components:      Result Value   Glucose, Bld 108 (*)    All other components within normal limits  URINALYSIS, COMPLETE (UACMP) WITH MICROSCOPIC - Abnormal; Notable for the following components:   Color, Urine STRAW (*)    APPearance CLEAR (*)    Hgb urine dipstick SMALL (*)    All other components within normal limits  PROTIME-INR  APTT  CBC  DIFFERENTIAL  TROPONIN I  CBG MONITORING, ED   ____________________________________________  EKG  EKG read and inserted by me shows what appears to be a flutter rate of 124 normal axis no apparent ST-T changes the baseline is very irregular ____________________________________________  RADIOLOGY  Dg Chest 2 View  Result Date: 01/01/2017 CLINICAL DATA:  Delays for week, slurred speech, lethargy, and heart flashes. Similar symptoms in the past when the patient had a bladder infection. EXAM: CHEST  2 VIEW COMPARISON:  PA and lateral chest x-ray of July 30, 2010 FINDINGS: The lungs are mildly hypoinflated. There is patchy increased density in the retrocardiac region likely on the left. The cardiac silhouette is enlarged. The pulmonary vascularity is not clearly engorged. There is no pleural effusion. The observed bony thorax is unremarkable. IMPRESSION: Probable left lower lobe atelectasis or pneumonia.  No overt CHF. Electronically Signed   By: David  Martinique M.D.   On: 01/01/2017 14:35   Ct Head Wo Contrast  Result Date: 01/01/2017 CLINICAL DATA:  Slurred speech and  lethargy. EXAM: CT HEAD WITHOUT CONTRAST TECHNIQUE: Contiguous axial images were obtained from the base of the skull through the vertex without intravenous contrast. COMPARISON:  08/04/2013 FINDINGS: Brain: There is no evidence for acute hemorrhage, hydrocephalus, mass lesion, or abnormal extra-axial fluid collection. No definite CT evidence for acute infarction. Vascular: Vascular coils identified in the region of the right internal carotid artery, stable. Internal carotid stent device noted on bone windows. Skull: No evidence for fracture. No worrisome lytic or sclerotic lesion. Sinuses/Orbits: Polypoid mucosal disease noted right maxillary sinus. Visualized portions of the globes and intraorbital fat are  unremarkable. Other: None. IMPRESSION: 1. No acute intracranial abnormality. 2. Stable appearance right internal carotid stent with evidence of aneurysm coils. Electronically Signed   By: Misty Stanley M.D.   On: 01/01/2017 14:29   CT shows no acute pathology. Chest x-ray shows what may be a new pneumonia. Patient's O2 sats had gone down to 8889% on room air with exertion ____________________________________________   PROCEDURES  Procedure(s) performed:   Procedures  Critical Care performed:  ____________________________________________   INITIAL IMPRESSION / ASSESSMENT AND PLAN / ED COURSE  patient has new or slurry speech today. She is out of the window for TPA. She has no other findings and she does have what may be a pneumonia on chest x-ray. This may just be the root causing her to have the worsening of her symptoms from her previous cerebral insult. Her sats do fall down any 80s with exertion. I will put her in the hospital to treat the pneumonia and observe her to make sure that the slurry speech gets better. She also probably will benefit from oxygen to keep her O2 sats above 92.      ____________________________________________   FINAL CLINICAL IMPRESSION(S) / ED  DIAGNOSES  Final diagnoses:  Community acquired pneumonia, unspecified laterality  Slurred speech     ED Discharge Orders    None       Note:  This document was prepared using Dragon voice recognition software and may include unintentional dictation errors.    Nena Polio, MD 01/01/17 [phone]

## 2017-01-01 NOTE — ED Notes (Signed)
External catheter placed on patient by Manuela Schwartz, RN.

## 2017-01-01 NOTE — ED Notes (Signed)
Pt assisted x 3 to toilet - pt is refusing to try the external catheter and keeps wanting on bedpan with no results.

## 2017-01-01 NOTE — ED Notes (Signed)
Family has decided to stay - family was wanting to take her home b/c we wouldn't not allow the pt to walk to the toilet. Pt very unsteady on her feet. The external catheter was in place but pt insists she can not use it.

## 2017-01-01 NOTE — Telephone Encounter (Signed)
FYI Late entry-Patient's husband called this morning stating that the patient was confused and had slurred speech.  He states that she got this way the last time she had a UTI and wanted to have the patient's urine checked.  I advised the caller that these could also be symptoms of stroke or other and that it would be best to take her to the hospital ER for evaluation.  The husband agreed and states he would take her right over.

## 2017-01-02 ENCOUNTER — Other Ambulatory Visit: Payer: Self-pay

## 2017-01-02 DIAGNOSIS — R4781 Slurred speech: Secondary | ICD-10-CM

## 2017-01-02 LAB — LIPID PANEL
CHOLESTEROL: 265 mg/dL — AB (ref 0–200)
HDL: 70 mg/dL (ref >40)
LDL Cholesterol: 161 mg/dL — ABNORMAL HIGH (ref 0–99)
Total CHOL/HDL Ratio: 3.8 RATIO
Triglycerides: 170 mg/dL — ABNORMAL HIGH (ref <150)
VLDL: 34 mg/dL (ref 0–40)

## 2017-01-02 LAB — CBC
HCT: 43.3 % (ref 35.0–47.0)
Hemoglobin: 14.9 g/dL (ref 12.0–16.0)
MCH: 30.6 pg (ref 26.0–34.0)
MCHC: 34.4 g/dL (ref 32.0–36.0)
MCV: 88.9 fL (ref 80.0–100.0)
PLATELETS: 233 10*3/uL (ref 150–440)
RBC: 4.87 MIL/uL (ref 3.80–5.20)
RDW: 14 % (ref 11.5–14.5)
WBC: 7.4 10*3/uL (ref 3.6–11.0)

## 2017-01-02 LAB — BASIC METABOLIC PANEL
ANION GAP: 11 (ref 5–15)
BUN: 15 mg/dL (ref 6–20)
CALCIUM: 9.1 mg/dL (ref 8.9–10.3)
CO2: 25 mmol/L (ref 22–32)
Chloride: 103 mmol/L (ref 101–111)
Creatinine, Ser: 0.87 mg/dL (ref 0.44–1.00)
GFR calc Af Amer: >60 mL/min (ref >60)
GLUCOSE: 88 mg/dL (ref 65–99)
POTASSIUM: 3.2 mmol/L — AB (ref 3.5–5.1)
SODIUM: 139 mmol/L (ref 135–145)

## 2017-01-02 LAB — TROPONIN I: Troponin I: <0.03 ng/mL (ref <0.03)

## 2017-01-02 NOTE — Progress Notes (Signed)
[place] [facility] [place] NAME: Gail Hardy    MR#:  [id number]  DATE OF BIRTH:  [birth year]  SUBJECTIVE:  CHIEF COMPLAINT: The patient's speech is still slurry.  Diffusely weak, has chronic left-sided weakness from previous stroke.  Feels safe at home has chronic history of depression denies any worsening of depression or anxiety.  Lives with husband  REVIEW OF SYSTEMS:  CONSTITUTIONAL: No fever, fatigue or weakness.  EYES: No blurred or double vision.  EARS, NOSE, AND THROAT: No tinnitus or ear pain.  RESPIRATORY: No cough, shortness of breath, wheezing or hemoptysis.  CARDIOVASCULAR: No chest pain, orthopnea, edema.  GASTROINTESTINAL: No nausea, vomiting, diarrhea or abdominal pain.  GENITOURINARY: No dysuria, hematuria.  ENDOCRINE: No polyuria, nocturia,  HEMATOLOGY: No anemia, easy bruising or bleeding SKIN: No rash or lesion. MUSCULOSKELETAL: No joint pain or arthritis.   NEUROLOGIC: Reporting slurry speech and worsening of extremity weakness, has chronic left-sided weakness from previous stroke .  PSYCHIATRY: No anxiety or depression.   DRUG ALLERGIES:   Allergies  Allergen Reactions  . Diazepam     Other reaction(s): Feeling agitated (finding)    VITALS:  Blood pressure (!) 101/58, pulse 89, temperature (!) 97.5 F (36.4 C), temperature source Oral, resp. rate 17, height 5\' 1"  (1.549 m), weight 100.2 kg (221 lb), SpO2 91 %.  PHYSICAL EXAMINATION:  GENERAL:  71 y.o.-year-old patient lying in the bed with no acute distress.  EYES: Pupils equal, round, reactive to light and accommodation. No scleral icterus. Extraocular muscles intact.  HEENT: Head atraumatic, normocephalic. Oropharynx and nasopharynx clear.  NECK:  Supple, no jugular venous distention. No thyroid enlargement, no tenderness.  LUNGS: Normal breath sounds bilaterally, no wheezing, rales,rhonchi or crepitation. No use of accessory muscles of respiration.   CARDIOVASCULAR: S1, S2 normal. No murmurs, rubs, or gallops.  ABDOMEN: Soft, nontender, nondistended. Bowel sounds present. No organomegaly or mass.  EXTREMITIES: No pedal edema, cyanosis, or clubbing.  NEUROLOGIC: Cranial nerves II through XII are intact. Muscle strength 3/5 in all extremities. Sensation intact. Gait not checked.  PSYCHIATRIC: The patient is alert and oriented x 3.  SKIN: No obvious rash, lesion, or ulcer.    LABORATORY PANEL:   CBC Recent Labs  Lab 01/02/17 0236  WBC 7.4  HGB 14.9  HCT 43.3  PLT 233   ------------------------------------------------------------------------------------------------------------------  Chemistries  Recent Labs  Lab 01/01/17 1339 01/02/17 0236  NA 140 139  K 3.5 3.2*  CL 105 103  CO2 23 25  GLUCOSE 108* 88  BUN 13 15  CREATININE 0.68 0.87  CALCIUM 9.5 9.1  AST 28  --   ALT 20  --   ALKPHOS 83  --   BILITOT 1.2  --    ------------------------------------------------------------------------------------------------------------------  Cardiac Enzymes Recent Labs  Lab 01/02/17 0742  TROPONINI <0.03   ------------------------------------------------------------------------------------------------------------------  RADIOLOGY:  Dg Chest 2 View  Result Date: 01/01/2017 CLINICAL DATA:  Delays for week, slurred speech, lethargy, and heart flashes. Similar symptoms in the past when the patient had a bladder infection. EXAM: CHEST  2 VIEW COMPARISON:  PA and lateral chest x-ray of July 30, 2010 FINDINGS: The lungs are mildly hypoinflated. There is patchy increased density in the retrocardiac region likely on the left. The cardiac silhouette is enlarged. The pulmonary vascularity is not clearly engorged. There is no pleural effusion. The observed bony thorax is unremarkable. IMPRESSION: Probable left lower lobe atelectasis or pneumonia.  No overt CHF. Electronically Signed  By: David  Martinique M.D.   On: 01/01/2017 14:35   Ct  Head Wo Contrast  Result Date: 01/01/2017 CLINICAL DATA:  Slurred speech and lethargy. EXAM: CT HEAD WITHOUT CONTRAST TECHNIQUE: Contiguous axial images were obtained from the base of the skull through the vertex without intravenous contrast. COMPARISON:  08/04/2013 FINDINGS: Brain: There is no evidence for acute hemorrhage, hydrocephalus, mass lesion, or abnormal extra-axial fluid collection. No definite CT evidence for acute infarction. Vascular: Vascular coils identified in the region of the right internal carotid artery, stable. Internal carotid stent device noted on bone windows. Skull: No evidence for fracture. No worrisome lytic or sclerotic lesion. Sinuses/Orbits: Polypoid mucosal disease noted right maxillary sinus. Visualized portions of the globes and intraorbital fat are unremarkable. Other: None. IMPRESSION: 1. No acute intracranial abnormality. 2. Stable appearance right internal carotid stent with evidence of aneurysm coils. Electronically Signed   By: Misty Stanley M.D.   On: 01/01/2017 14:29   Ct Angio Chest Pe W Or Wo Contrast  Result Date: 01/01/2017 CLINICAL DATA:  Productive cough and weakness. EXAM: CT ANGIOGRAPHY CHEST WITH CONTRAST TECHNIQUE: Multidetector CT imaging of the chest was performed using the standard protocol during bolus administration of intravenous contrast. Multiplanar CT image reconstructions and MIPs were obtained to evaluate the vascular anatomy. CONTRAST:  82mL ISOVUE-370 IOPAMIDOL (ISOVUE-370) INJECTION 76% COMPARISON:  Chest CT 02/02/2010 FINDINGS: Cardiovascular: The heart is normal in size for age. No pericardial effusion. There is tortuosity of the thoracic aorta but no aneurysm or dissection. Remarkably little atherosclerotic calcifications for age. Scattered coronary artery calcifications. The pulmonary arterial tree is fairly well opacified. No definite filling defects to suggest pulmonary embolism. Mediastinum/Nodes: No mediastinal or hilar mass or  lymphadenopathy. Esophagus is grossly normal. Lungs/Pleura: No acute pulmonary findings. Dependent subpleural atelectasis but no infiltrates, edema or effusions. No worrisome pulmonary lesions or pulmonary nodules. Upper Abdomen: No significant upper abdominal findings. Surgical changes from a hiatal hernia repair. Hepatic cysts are noted. Musculoskeletal: No breast masses, supraclavicular or axillary lymphadenopathy. The bony structures are unremarkable. Review of the MIP images confirms the above findings. IMPRESSION: 1. No CT findings for pulmonary embolism. 2. Normal thoracic aorta. 3. No acute pulmonary findings or worrisome pulmonary lesions. Aortic Atherosclerosis (ICD10-I70.0). Electronically Signed   By: Marijo Sanes M.D.   On: 01/01/2017 17:13   Mr Brain Wo Contrast  Result Date: 01/01/2017 CLINICAL DATA:  Focal neuro deficit for over 6 hours. Slurred speech starting at 2 a.m. UTI. EXAM: MRI HEAD WITHOUT CONTRAST TECHNIQUE: Multiplanar, multiecho pulse sequences of the brain and surrounding structures were obtained without intravenous contrast. COMPARISON:  Head CT from earlier today. FINDINGS: Brain: No acute infarction, hemorrhage, hydrocephalus, extra-axial collection or mass lesion. Age normal brain volume. Minor FLAIR hyperintensity in the cerebral white matter, age congruent. Vascular: Loss of left sigmoid and upper IJ flow void is likely slow flow rather than occlusion. This area was not hyperdense on preceding head CT. There is subtle artifact in the right ICA. The patient is status post stent assisted aneurysm coil embolization. Skull and upper cervical spine: Negative for marrow lesion. Sinuses/Orbits: Mucous retention cysts in the right maxillary sinus. IMPRESSION: Negative exam.  No explanation for symptoms. Electronically Signed   By: Monte Fantasia M.D.   On: 01/01/2017 19:25    EKG:   Orders placed or performed during the hospital encounter of 01/01/17  . ED EKG  . ED EKG  .  EKG 12-Lead  . EKG 12-Lead  . EKG 12-Lead  .  EKG 12-Lead    ASSESSMENT AND PLAN:    Patient is a 71 year old white female with previous history of stroke and a brain aneurysm is presenting with slurred speech noted to be tachycardic and some hypoxia with activity  1.    Dysarthria-unclear etiology MRI  brain negative CT head negative, Continue aspirin Neurology consult placed  2.  Possible pneumonia based on her chest x-ray CT [name] negative for pulmonary embolism or any other acute findings On IV Rocephin and azithromycin for possible pneumonia, discontinue antibiotics if patient is afebrile in the next 24 hours Speech therapy recommended dysphagia 2 diet  3.  Sinus tachycardia could be from anxiety  improved clinically with Cardizem low-dose  on telemetry monitoring  4.  Chronic history of depression and  Generalized anxiety disorder continue therapy with Wellbutrin Celexa Denies any acute episodes of anxiety or worsening of depression  5.  DVT prophylaxis Lovenox for dvt proph   PT eval is pending  All the records are reviewed and case discussed with Care Management/Social Workerr. Management plans discussed with the patient, she is in agreement.  CODE STATUS: fc , husband [name] is the healthcare power of attorney message left to call back  TOTAL TIME TAKING CARE OF THIS PATIENT: 36 minutes.   POSSIBLE D/C IN 1-2 DAYS, DEPENDING ON CLINICAL CONDITION.  Note: This dictation was prepared with Dragon dictation along with smaller phrase technology. Any transcriptional errors that result from this process are unintentional.   Nicholes Mango M.D on 01/02/2017 at 10:31 AM  Between 7am to 6pm - Pager - [phone] After 6pm go to [email or web address] - password [email or web address] [facility]  [place] [facility]  Office  [phone]  CC: Primary care physician; Jerrol Banana., MD

## 2017-01-02 NOTE — Consult Note (Signed)
Reason for Consult:Slurred speech Referring Physician: Gouru  CC: Slurred speech  HPI: Gail Hardy is an 71 y.o. female with a history of depression and BPD who was noted to have slurred speech on 0200 on 11/21. She has had this presentation with infections in the past and was brought in for evaluation.  Patient also reports generalized weakness and fatigue.  Husband reports she has been anxious lately. According to the ED physician when ambulated her oxygen drops.  Also her heart rate is been elevated into the 120s. She has been having a cough productive of white sputum.   Patient was outside of time window for tPA.     Past Medical History:  Diagnosis Date  . Anemia   . Aneurysm ([place]) 2011  . Blood transfusion without reported diagnosis 2013,2014   x 2  . Cancer ([place])   . Depression   . Family history of adverse reaction to anesthesia    daughter nausea and vomiting  . Glaucoma   . Hernia   . Hyperlipidemia   . Pulmonary emboli ([place]) 2011  . Stroke [facility]) 2010    Past Surgical History:  Procedure Laterality Date  . ABDOMINAL HYSTERECTOMY    . BREAST EXCISIONAL BIOPSY Right    pt states she had "lumps removed from breast as a teen"  . CEREBRAL ANEURYSM REPAIR  2011   stent and coil insertion  . CHOLECYSTECTOMY    . COLONOSCOPY  2013   [facility]  . HERNIA REPAIR  2014   lap paraesophageal repair  . INTRAMEDULLARY (IM) NAIL INTERTROCHANTERIC Left 10/19/2014   Procedure: INTRAMEDULLARY (IM) NAIL INTERTROCHANTRIC;  Surgeon: Earnestine Leys, MD;  Location: [facility] ORS;  Service: Orthopedics;  Laterality: Left;  . UPPER GI ENDOSCOPY  2013, 2014   [place]    Family History  Problem Relation Age of Onset  . Emphysema Father   . COPD Mother   . Heart disease Mother   . Heart disease Brother   . Dementia Brother   . Liver disease Brother   . Heart disease Brother     Social History:  reports that she quit smoking about 27 years ago. Her smoking use included cigarettes. She  quit after 10.00 years of use. she has never used smokeless tobacco. She reports that she does not drink alcohol or use drugs.  Allergies  Allergen Reactions  . Diazepam     Other reaction(s): Feeling agitated (finding)    Medications:  I have reviewed the patient's current medications. Prior to Admission:  Medications Prior to Admission  Medication Sig Dispense Refill Last Dose  . acetaminophen-codeine (TYLENOL #3) 300-30 MG tablet    01/01/2017 at 0800  . amphetamine-dextroamphetamine (ADDERALL) 10 MG tablet TAKE 1 TABLET BY MOUTH TWICE (2) DAILY 60 tablet 0 01/01/2017 at 0800  . ARIPiprazole (ABILIFY) 30 MG tablet TAKE 1 TABLET BY MOUTH ONCE DAILY 30 tablet 11 01/01/2017 at 0800  . buPROPion (WELLBUTRIN SR) 150 MG 12 hr tablet TAKE 1 TABLET BY MOUTH ONCE A DAY 90 tablet 3 01/01/2017 at 0800  . clonazePAM (KLONOPIN) 1 MG tablet TAKE 1 TABLET BY MOUTH EVERY 8 HOURS AS NEEDED 90 tablet 4 prn at prn  . gabapentin (NEURONTIN) 400 MG capsule Take 1 capsule (400 mg total) by mouth 3 (three) times daily. 270 capsule 3 01/01/2017 at 0800  . latanoprost (XALATAN) 0.005 % ophthalmic solution Place 1 drop into both eyes at bedtime.    01/01/2017 at 0800  . temazepam (RESTORIL) 30 MG capsule  Take 1 capsule (30 mg total) by mouth daily. 30 capsule 5 01/01/2017 at 0800  . capsaicin (ZOSTRIX) 0.025 % cream Apply topically 3 (three) times daily. 60 g 0 prn at prn  . citalopram (CELEXA) 40 MG tablet TAKE 1 TABLET BY MOUTH ONCE A DAY (Patient not taking: Reported on 01/01/2017) 30 tablet 11 Not Taking at Unknown time  . promethazine (PHENERGAN) 25 MG tablet TAKE 1/2 TO 1 TABLET BY MOUTH EVERY 6 HOURS AS NEEDED 100 tablet 0 prn at prn   Scheduled: . amphetamine-dextroamphetamine  10 mg Oral Q breakfast  . ARIPiprazole  30 mg Oral Daily  . aspirin  81 mg Oral Daily  . aspirin EC  81 mg Oral Daily  . buPROPion  150 mg Oral Daily  . diltiazem  60 mg Oral Q8H  . enoxaparin (LOVENOX) injection  40 mg  Subcutaneous Q12H  . gabapentin  400 mg Oral TID  . latanoprost  1 drop Both Eyes QHS  . sodium chloride flush  3 mL Intravenous Q12H  . temazepam  30 mg Oral Daily    ROS: History obtained from the patient and husband  General ROS: fatigue Psychological ROS: anxiety Ophthalmic ROS: negative for - blurry vision, double vision, eye pain or loss of vision ENT ROS: negative for - epistaxis, nasal discharge, oral lesions, sore throat, tinnitus or vertigo Allergy and Immunology ROS: negative for - hives or itchy/watery eyes Hematological and Lymphatic ROS: negative for - bleeding problems, bruising or swollen lymph nodes Endocrine ROS: negative for - galactorrhea, hair pattern changes, polydipsia/polyuria or temperature intolerance Respiratory ROS: productive cough Cardiovascular ROS:  dyspnea on exertion Gastrointestinal ROS: negative for - abdominal pain, diarrhea, hematemesis, nausea/vomiting or stool incontinence Genito-Urinary ROS: negative for - dysuria, hematuria, incontinence or urinary frequency/urgency Musculoskeletal ROS: left hip pain Neurological ROS: as noted in HPI Dermatological ROS: negative for rash and skin lesion changes  Physical Examination: Blood pressure (!) 101/58, pulse 89, temperature (!) 97.5 F (36.4 C), temperature source Oral, resp. rate 17, height 5\' 1"  (1.549 m), weight 100.2 kg (221 lb), SpO2 91 %.  HEENT-  Normocephalic, no lesions, without obvious abnormality.  Normal external eye and conjunctiva.  Normal TM's bilaterally.  Normal auditory canals and external ears. Normal external nose, mucus membranes and septum.  Normal pharynx. Cardiovascular- S1, S2 normal, pulses palpable throughout   Lungs- chest clear, no wheezing, rales, normal symmetric air entry Abdomen- soft, non-tender; bowel sounds normal; no masses,  no organomegaly Extremities- no edema Lymph-no adenopathy palpable Musculoskeletal-no joint tenderness, deformity or swelling Skin-warm  and dry, no hyperpigmentation, vitiligo, or suspicious lesions  Neurological Examination   Mental Status: Lethargic, lying in bed with eyes closed.  Oriented, thought content appropriate.  Speech fluent but dysarthric.  Edentulous.  Follows 3-step commands without difficulty. Cranial Nerves: II: Discs flat bilaterally; Visual fields grossly normal, pupils equal, round, reactive to light and accommodation III,IV, VI: ptosis not present, extra-ocular motions intact bilaterally V,VII: smile symmetric with decrease in left NLF that may be due to loss of teeth on the left, facial light touch sensation normal bilaterally VIII: hearing normal bilaterally IX,X: gag reflex present XI: bilateral shoulder shrug XII: midline tongue extension Motor: Right : Upper extremity   5/5    Left:     Upper extremity   5/5 Patient unable to lift either lower extremity beyond an inch or two.  Gives very little effort.   Sensory: Pinprick and light touch intact throughout, bilaterally Deep Tendon Reflexes: 2+  in the upper extremities and absent in the lower extremities Plantars: Right: downgoing   Left: downgoing Cerebellar: Normal finger-to-nose and normal heel-to-shin testing bilaterally.  BUE tremor.   Gait: not tested due to safety concerns  Laboratory Studies:   Basic Metabolic Panel: Recent Labs  Lab 01/01/17 1339 01/02/17 0236  NA 140 139  K 3.5 3.2*  CL 105 103  CO2 23 25  GLUCOSE 108* 88  BUN 13 15  CREATININE 0.68 0.87  CALCIUM 9.5 9.1    Liver Function Tests: Recent Labs  Lab 01/01/17 1339  AST 28  ALT 20  ALKPHOS 83  BILITOT 1.2  PROT 7.9  ALBUMIN 4.2   No results for input(s): LIPASE, AMYLASE in the last 168 hours. No results for input(s): AMMONIA in the last 168 hours.  CBC: Recent Labs  Lab 01/01/17 1339 01/02/17 0236  WBC 6.1 7.4  NEUTROABS 3.1  --   HGB 15.5 14.9  HCT 46.0 43.3  MCV 88.9 88.9  PLT 257 233    Cardiac Enzymes: Recent Labs  Lab 01/01/17 1339  01/01/17 2024 01/02/17 0236 01/02/17 0742  TROPONINI <0.03 <0.03 <0.03 <0.03    BNP: Invalid input(s): POCBNP  CBG: No results for input(s): GLUCAP in the last 168 hours.  Microbiology: Results for orders placed or performed during the hospital encounter of 01/01/17  Culture, blood (routine x 2)     Status: None (Preliminary result)   Collection Time: 01/01/17  4:13 PM  Result Value Ref Range Status   Specimen Description BLOOD RIGHT ANTECUBITAL  Final   Special Requests   Final    BOTTLES DRAWN AEROBIC AND ANAEROBIC Blood Culture results may not be optimal due to an inadequate volume of blood received in culture bottles   Culture NO GROWTH < 24 HOURS  Final   Report Status PENDING  Incomplete  Culture, blood (routine x 2)     Status: None (Preliminary result)   Collection Time: 01/01/17  4:21 PM  Result Value Ref Range Status   Specimen Description BLOOD LEFT ANTECUBITAL  Final   Special Requests   Final    BOTTLES DRAWN AEROBIC AND ANAEROBIC Blood Culture results may not be optimal due to an inadequate volume of blood received in culture bottles   Culture NO GROWTH < 24 HOURS  Final   Report Status PENDING  Incomplete    Coagulation Studies: Recent Labs    01/01/17 1339  LABPROT 12.8  INR 0.97    Urinalysis:  Recent Labs  Lab 01/01/17 1400  COLORURINE STRAW*  LABSPEC 1.008  PHURINE 7.0  GLUCOSEU NEGATIVE  HGBUR SMALL*  BILIRUBINUR NEGATIVE  KETONESUR NEGATIVE  PROTEINUR NEGATIVE  NITRITE NEGATIVE  LEUKOCYTESUR NEGATIVE    Lipid Panel:     Component Value Date/Time   CHOL 265 (H) 01/02/2017 0236   CHOL 260 (H) 04/24/2016 1542   TRIG 170 (H) 01/02/2017 0236   HDL 70 01/02/2017 0236   HDL 87 04/24/2016 1542   CHOLHDL 3.8 01/02/2017 0236   VLDL 34 01/02/2017 0236   LDLCALC 161 (H) 01/02/2017 0236   LDLCALC 154 (H) 04/24/2016 1542    HgbA1C:  Lab Results  Component Value Date   HGBA1C 5.3 07/31/2015    Urine Drug Screen:  No results found for:  LABOPIA, COCAINSCRNUR, LABBENZ, AMPHETMU, THCU, LABBARB  Alcohol Level: No results for input(s): ETH in the last 168 hours.  Other results: EKG: sinus tachycardia at 114 bpm.  Imaging: Dg Chest 2 View  Result Date:  01/01/2017 CLINICAL DATA:  Delays for week, slurred speech, lethargy, and heart flashes. Similar symptoms in the past when the patient had a bladder infection. EXAM: CHEST  2 VIEW COMPARISON:  PA and lateral chest x-ray of July 30, 2010 FINDINGS: The lungs are mildly hypoinflated. There is patchy increased density in the retrocardiac region likely on the left. The cardiac silhouette is enlarged. The pulmonary vascularity is not clearly engorged. There is no pleural effusion. The observed bony thorax is unremarkable. IMPRESSION: Probable left lower lobe atelectasis or pneumonia.  No overt CHF. Electronically Signed   By: David  Martinique M.D.   On: 01/01/2017 14:35   Ct Head Wo Contrast  Result Date: 01/01/2017 CLINICAL DATA:  Slurred speech and lethargy. EXAM: CT HEAD WITHOUT CONTRAST TECHNIQUE: Contiguous axial images were obtained from the base of the skull through the vertex without intravenous contrast. COMPARISON:  08/04/2013 FINDINGS: Brain: There is no evidence for acute hemorrhage, hydrocephalus, mass lesion, or abnormal extra-axial fluid collection. No definite CT evidence for acute infarction. Vascular: Vascular coils identified in the region of the right internal carotid artery, stable. Internal carotid stent device noted on bone windows. Skull: No evidence for fracture. No worrisome lytic or sclerotic lesion. Sinuses/Orbits: Polypoid mucosal disease noted right maxillary sinus. Visualized portions of the globes and intraorbital fat are unremarkable. Other: None. IMPRESSION: 1. No acute intracranial abnormality. 2. Stable appearance right internal carotid stent with evidence of aneurysm coils. Electronically Signed   By: Misty Stanley M.D.   On: 01/01/2017 14:29   Ct Angio Chest  Pe W Or Wo Contrast  Result Date: 01/01/2017 CLINICAL DATA:  Productive cough and weakness. EXAM: CT ANGIOGRAPHY CHEST WITH CONTRAST TECHNIQUE: Multidetector CT imaging of the chest was performed using the standard protocol during bolus administration of intravenous contrast. Multiplanar CT image reconstructions and MIPs were obtained to evaluate the vascular anatomy. CONTRAST:  91mL ISOVUE-370 IOPAMIDOL (ISOVUE-370) INJECTION 76% COMPARISON:  Chest CT 02/02/2010 FINDINGS: Cardiovascular: The heart is normal in size for age. No pericardial effusion. There is tortuosity of the thoracic aorta but no aneurysm or dissection. Remarkably little atherosclerotic calcifications for age. Scattered coronary artery calcifications. The pulmonary arterial tree is fairly well opacified. No definite filling defects to suggest pulmonary embolism. Mediastinum/Nodes: No mediastinal or hilar mass or lymphadenopathy. Esophagus is grossly normal. Lungs/Pleura: No acute pulmonary findings. Dependent subpleural atelectasis but no infiltrates, edema or effusions. No worrisome pulmonary lesions or pulmonary nodules. Upper Abdomen: No significant upper abdominal findings. Surgical changes from a hiatal hernia repair. Hepatic cysts are noted. Musculoskeletal: No breast masses, supraclavicular or axillary lymphadenopathy. The bony structures are unremarkable. Review of the MIP images confirms the above findings. IMPRESSION: 1. No CT findings for pulmonary embolism. 2. Normal thoracic aorta. 3. No acute pulmonary findings or worrisome pulmonary lesions. Aortic Atherosclerosis (ICD10-I70.0). Electronically Signed   By: Marijo Sanes M.D.   On: 01/01/2017 17:13   Mr Brain Wo Contrast  Result Date: 01/01/2017 CLINICAL DATA:  Focal neuro deficit for over 6 hours. Slurred speech starting at 2 a.m. UTI. EXAM: MRI HEAD WITHOUT CONTRAST TECHNIQUE: Multiplanar, multiecho pulse sequences of the brain and surrounding structures were obtained  without intravenous contrast. COMPARISON:  Head CT from earlier today. FINDINGS: Brain: No acute infarction, hemorrhage, hydrocephalus, extra-axial collection or mass lesion. Age normal brain volume. Minor FLAIR hyperintensity in the cerebral white matter, age congruent. Vascular: Loss of left sigmoid and upper IJ flow void is likely slow flow rather than occlusion. This area was not hyperdense  on preceding head CT. There is subtle artifact in the right ICA. The patient is status post stent assisted aneurysm coil embolization. Skull and upper cervical spine: Negative for marrow lesion. Sinuses/Orbits: Mucous retention cysts in the right maxillary sinus. IMPRESSION: Negative exam.  No explanation for symptoms. Electronically Signed   By: Monte Fantasia M.D.   On: 01/01/2017 19:25     Assessment/Plan: 71 year old female with complaints of lethargy, slurred speech and generalized weakness.  Lab work unremarkable. No evidence of UTI or PNA.  MRI of the brain reviewed and shows no acute changes.  Patient is tachycardic though and has bee noted to be hypoxic.  Slurred speech amy be associated to acute medical illness but also can not rule out a manifestation of her psychiatric illness.  Recommendations: 1.  Would consider starting to taper sedating medications.   2.  Due to history of past infarct would benefit from a statin.  LDL 154.   3.  ASA 81mg  daily  Alexis Goodell, MD Neurology [phone] 01/02/2017, 6:52 PM

## 2017-01-03 DIAGNOSIS — R410 Disorientation, unspecified: Principal | ICD-10-CM

## 2017-01-03 LAB — LIPID PANEL
Cholesterol: 283 mg/dL — ABNORMAL HIGH (ref 0–200)
HDL: 58 mg/dL (ref >40)
LDL CALC: 179 mg/dL — AB (ref 0–99)
Total CHOL/HDL Ratio: 4.9 RATIO
Triglycerides: 229 mg/dL — ABNORMAL HIGH (ref <150)
VLDL: 46 mg/dL — AB (ref 0–40)

## 2017-01-03 MED ORDER — TEMAZEPAM 15 MG PO CAPS: 15.0000 mg | ORAL_CAPSULE | Freq: Every evening | ORAL | Status: DC | PRN

## 2017-01-03 MED ORDER — TEMAZEPAM 15 MG PO CAPS: 15.0000 mg | ORAL_CAPSULE | Freq: Every day | ORAL | Status: DC

## 2017-01-03 MED ORDER — CLONAZEPAM 0.5 MG PO TABS: 0.2500 mg | ORAL_TABLET | Freq: Three times a day (TID) | ORAL | Status: DC | PRN

## 2017-01-03 MED ORDER — SODIUM CHLORIDE 0.9 % IV SOLN
INTRAVENOUS | Status: DC
  Administered 2017-01-03 – 2017-01-05 (×5): via INTRAVENOUS

## 2017-01-03 MED ORDER — SODIUM CHLORIDE 0.9 % IV BOLUS (SEPSIS)
500.0000 mL | Freq: Once | INTRAVENOUS | Status: AC
  Administered 2017-01-03: 500 mL via INTRAVENOUS

## 2017-01-03 NOTE — Progress Notes (Signed)
PT Cancellation Note  Patient Details Name: Gail Hardy MRN: [id number] DOB: 26-Sep-1945   Cancelled Treatment:    Reason Eval/Treat Not Completed: Patient declined, no reason specified.  Pt reports being too tired and weak to participate in PT and declining PT today.  Will re-attempt PT treatment at a later date/time.  Leitha Bleak, PT 01/03/17, 3:19 PM [phone]

## 2017-01-03 NOTE — Progress Notes (Signed)
OT Cancellation Note  Patient Details Name: Gail Hardy MRN: [id number] DOB: Jul 27, 1945   Cancelled Treatment:    Reason Eval/Treat Not Completed: Fatigue/lethargy limiting ability to participate. Order received, chart reviewed. Upon attempt, pt very fatigued, unable to keep eyes open. Pt/spouse requesting therapy hold this morning to allow for more time to rest. Will re-attempt at later date/time as pt is available and as schedule permits.  Jeni Salles, MPH, MS, OTR/L ascom [phone] 01/03/17, 11:14 AM

## 2017-01-03 NOTE — Progress Notes (Signed)
[facility] [place] NAME: Gail Hardy    MR#:  [id number]  DATE OF BIRTH:  28-Oct-1945  SUBJECTIVE:  CHIEF COMPLAINT: The patient's speech is still slurry.  Diffusely weak, has chronic left-sided weakness from previous stroke.  Feels safe at home has chronic history of depression denies any worsening of depression or anxiety.  Lives with husband  REVIEW OF SYSTEMS:  CONSTITUTIONAL: No fever, fatigue or weakness.  EYES: No blurred or double vision.  EARS, NOSE, AND THROAT: No tinnitus or ear pain.  RESPIRATORY: No cough, shortness of breath, wheezing or hemoptysis.  CARDIOVASCULAR: No chest pain, orthopnea, edema.  GASTROINTESTINAL: No nausea, vomiting, diarrhea or abdominal pain.  GENITOURINARY: No dysuria, hematuria.  ENDOCRINE: No polyuria, nocturia,  HEMATOLOGY: No anemia, easy bruising or bleeding SKIN: No rash or lesion. MUSCULOSKELETAL: No joint pain or arthritis.   NEUROLOGIC: Reporting slurry speech and worsening of extremity weakness, has chronic left-sided weakness from previous stroke .  PSYCHIATRY: No anxiety or depression.   DRUG ALLERGIES:   Allergies  Allergen Reactions  . Diazepam     Other reaction(s): Feeling agitated (finding)    VITALS:  Blood pressure (!) 147/59, pulse (!) 103, temperature 97.7 F (36.5 C), temperature source Oral, resp. rate 20, height 5\' 1"  (1.549 m), weight 100.2 kg (221 lb), SpO2 93 %.  PHYSICAL EXAMINATION:  GENERAL:  71 y.o.-year-old patient lying in the bed with no acute distress.  EYES: Pupils equal, round, reactive to light and accommodation. No scleral icterus. Extraocular muscles intact.  HEENT: Head atraumatic, normocephalic. Oropharynx and nasopharynx clear.  NECK:  Supple, no jugular venous distention. No thyroid enlargement, no tenderness.  LUNGS: Normal breath sounds bilaterally, no wheezing, rales,rhonchi or crepitation. No use of accessory muscles of respiration.   CARDIOVASCULAR: S1, S2 normal. No murmurs, rubs, or gallops.  ABDOMEN: Soft, nontender, nondistended. Bowel sounds present. No organomegaly or mass.  EXTREMITIES: No pedal edema, cyanosis, or clubbing.  NEUROLOGIC: Cranial nerves II through XII are intact. Muscle strength 3/5 in all extremities. Sensation intact. Gait not checked.  PSYCHIATRIC: The patient is alert and oriented x 3.  SKIN: No obvious rash, lesion, or ulcer.    LABORATORY PANEL:   CBC Recent Labs  Lab 01/02/17 0236  WBC 7.4  HGB 14.9  HCT 43.3  PLT 233   ------------------------------------------------------------------------------------------------------------------  Chemistries  Recent Labs  Lab 01/01/17 1339 01/02/17 0236  NA 140 139  K 3.5 3.2*  CL 105 103  CO2 23 25  GLUCOSE 108* 88  BUN 13 15  CREATININE 0.68 0.87  CALCIUM 9.5 9.1  AST 28  --   ALT 20  --   ALKPHOS 83  --   BILITOT 1.2  --    ------------------------------------------------------------------------------------------------------------------  Cardiac Enzymes Recent Labs  Lab 01/02/17 0742  TROPONINI <0.03   ------------------------------------------------------------------------------------------------------------------  RADIOLOGY:  Ct Angio Chest Pe W Or Wo Contrast  Result Date: 01/01/2017 CLINICAL DATA:  Productive cough and weakness. EXAM: CT ANGIOGRAPHY CHEST WITH CONTRAST TECHNIQUE: Multidetector CT imaging of the chest was performed using the standard protocol during bolus administration of intravenous contrast. Multiplanar CT image reconstructions and MIPs were obtained to evaluate the vascular anatomy. CONTRAST:  48mL ISOVUE-370 IOPAMIDOL (ISOVUE-370) INJECTION 76% COMPARISON:  Chest CT 02/02/2010 FINDINGS: Cardiovascular: The heart is normal in size for age. No pericardial effusion. There is tortuosity of the thoracic aorta but no aneurysm or dissection. Remarkably little atherosclerotic calcifications for age.  Scattered coronary artery calcifications.  The pulmonary arterial tree is fairly well opacified. No definite filling defects to suggest pulmonary embolism. Mediastinum/Nodes: No mediastinal or hilar mass or lymphadenopathy. Esophagus is grossly normal. Lungs/Pleura: No acute pulmonary findings. Dependent subpleural atelectasis but no infiltrates, edema or effusions. No worrisome pulmonary lesions or pulmonary nodules. Upper Abdomen: No significant upper abdominal findings. Surgical changes from a hiatal hernia repair. Hepatic cysts are noted. Musculoskeletal: No breast masses, supraclavicular or axillary lymphadenopathy. The bony structures are unremarkable. Review of the MIP images confirms the above findings. IMPRESSION: 1. No CT findings for pulmonary embolism. 2. Normal thoracic aorta. 3. No acute pulmonary findings or worrisome pulmonary lesions. Aortic Atherosclerosis (ICD10-I70.0). Electronically Signed   By: Marijo Sanes M.D.   On: 01/01/2017 17:13   Mr Brain Wo Contrast  Result Date: 01/01/2017 CLINICAL DATA:  Focal neuro deficit for over 6 hours. Slurred speech starting at 2 a.m. UTI. EXAM: MRI HEAD WITHOUT CONTRAST TECHNIQUE: Multiplanar, multiecho pulse sequences of the brain and surrounding structures were obtained without intravenous contrast. COMPARISON:  Head CT from earlier today. FINDINGS: Brain: No acute infarction, hemorrhage, hydrocephalus, extra-axial collection or mass lesion. Age normal brain volume. Minor FLAIR hyperintensity in the cerebral white matter, age congruent. Vascular: Loss of left sigmoid and upper IJ flow void is likely slow flow rather than occlusion. This area was not hyperdense on preceding head CT. There is subtle artifact in the right ICA. The patient is status post stent assisted aneurysm coil embolization. Skull and upper cervical spine: Negative for marrow lesion. Sinuses/Orbits: Mucous retention cysts in the right maxillary sinus. IMPRESSION: Negative exam.  No  explanation for symptoms. Electronically Signed   By: Monte Fantasia M.D.   On: 01/01/2017 19:25    EKG:   Orders placed or performed during the hospital encounter of 01/01/17  . ED EKG  . ED EKG  . EKG 12-Lead  . EKG 12-Lead  . EKG 12-Lead  . EKG 12-Lead    ASSESSMENT AND PLAN:    Patient is a 71 year old white female with previous history of stroke and a brain aneurysm is presenting with slurred speech noted to be tachycardic and some hypoxia with activity  1.    Dysarthria-unclear etiology MRI  brain negative CT head negative, Continue aspirin Neurology recommending to decrease the sedation meds DC'd morphine, Decrease Klonopin dose  2.  Possible pneumonia based on her chest x-ray CT [name] negative for pulmonary embolism or any other acute findings On IV Rocephin and azithromycin for possible pneumonia, discontinue antibiotics if patient is afebrile in the next 24 hours Speech therapy recommended dysphagia 2 diet  3.  Sinus tachycardia could be from anxiety  improved clinically with Cardizem low-dose  on telemetry monitoring  4.  Chronic history of depression and  Generalized anxiety disorder c Denies any acute episodes of anxiety or worsening of depression Psych recommending to  reduce the dose of temazepam to 15 mg at night and skip her Celexa and Wellbutrin for 2 days    5.  DVT prophylaxis Lovenox for dvt proph   PT eval is pending  All the records are reviewed and case discussed with Care Management/Social Workerr. Management plans discussed with the patient, her husband [name] at bedside, they are in agreement.  CODE STATUS: fc , husband [name] is the healthcare power of attorney message left to call back  TOTAL TIME TAKING CARE OF THIS PATIENT: 36 minutes.   POSSIBLE D/C IN 1-2 DAYS, DEPENDING ON CLINICAL CONDITION.  Note: This dictation was  prepared with Dragon dictation along with smaller phrase technology. Any transcriptional errors that  result from this process are unintentional.   Nicholes Mango M.D on 01/03/2017 at 4:09 PM  Between 7am to 6pm - Pager - [phone] After 6pm go to [email or web address] - password [email or web address] [facility]  [facility]  Office  [phone]  CC: Primary care physician; Jerrol Banana., MD

## 2017-01-03 NOTE — Consult Note (Addendum)
[facility] Consult   Reason for Consult: Psychiatry consulted by primary team because patient seems lethargic.  Has seemed lethargic for the last 2 days, does probably have an infectious process going on.  Patient also has a diagnosis of major depressive disorder and is on multiple psychotropic medications.  Psychiatry consulted for rationalization of medication doses. Referring Physician:  Dustin Flock Patient Identification: Gail Hardy MRN:  [id number] Principal Diagnosis: <principal problem not specified> Diagnosis:   Patient Active Problem List   Diagnosis Date Noted  . Confusion [R41.0] 01/01/2017  . Pneumonia [J18.9] 01/01/2017  . History of calcium pyrophosphate deposition disease (CPPD) [Z87.39] 10/08/2016  . Pseudogout involving multiple joints [M11.89] 10/08/2016  . Osteoarthritis [M15.0] 10/08/2016  . Lumbar facet hypertrophy [M47.896] 10/08/2016  . Spondylosis of lumbar spine [M47.816] 10/08/2016  . Chronic low back pain (Bilateral) [M54.5, G89.29] 10/08/2016  . Chronic shoulder pain (Left) [M25.512, G89.29] 10/08/2016  . Long term prescription benzodiazepine use [Z79.899] 10/08/2016  . Long term prescription opiate use [Z79.891] 10/08/2016  . Long term (current) use of opiate analgesic [Z79.891] 10/08/2016  . Opiate use [F11.90] 10/08/2016  . Chronic knee pain (Left) [Y77.412, G89.29] 10/08/2016  . Osteoarthritis of knee (Left) [M17.12] 10/08/2016  . Chronic hip pain (Left) [I78.676, G89.29] 10/08/2016  . Osteoarthritis of hip (Left) [M16.12] 10/08/2016  . Chronic pain syndrome [G89.4] 10/08/2016  . Disturbance of skin sensation [R20.9] 10/08/2016  . Cerebrovascular accident (CVA) ([place]) [I63.9] 03/15/2015  . Pressure ulcer [L89.90] 10/22/2014  . Stress fracture of hip with delayed healing [M84.359G] 10/19/2014  . Allergic rhinitis [J30.9] 09/20/2014  . Anxiety disorder [F41.9] 09/20/2014  . Absolute anemia [D64.9] 09/20/2014  . Bipolar 1  disorder, depressed ([place]) [F31.9] 09/20/2014  . Body mass index of 60 or higher [IMO0002] 09/20/2014  . Anemia due to blood loss [D50.0] 09/20/2014  . Aneurysm, cerebral [I67.1] 09/20/2014  . Chronic headache [R51] 09/20/2014  . Claustrophobia [F40.240] 09/20/2014  . Cerebral vascular accident ([place]) [I63.9] 09/20/2014  . Acute confusion due to medical condition [F05] 09/20/2014  . Clinical depression [F32.9] 09/20/2014  . Abnormal liver enzymes [R74.8] 09/20/2014  . 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 Diaphragmatic hernia [K44.9] 09/20/2014  . Gastric erosion [K25.9] 09/20/2014  . Enterogastritis [K52.9] 09/20/2014  . 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 Hypercholesteremia [E78.00] 09/20/2014  . Cannot sleep [G47.00] 09/20/2014  . Malaise and fatigue [R53.81, R53.83] 09/20/2014  . Bad memory [R41.3] 09/20/2014  . Altered blood in stool [IMO0001] 09/20/2014  . Candida glabrata infection [B37.9] 09/20/2014  . Decreased motor strength [R53.1] 09/20/2014  . Adiposity [E66.9] 09/20/2014  . Excess weight [E66.3] 09/20/2014  . Paralysis agitans ([place]) [G20] 09/20/2014  . Breakdown [IMO0001] 09/20/2014  . PE (pulmonary embolism) [I26.99] 09/20/2014  . Hypercholesterolemia without hypertriglyceridemia [E78.00] 09/20/2014  . Restless leg [G25.81] 09/20/2014  . Neuralgia neuritis, sciatic nerve [M54.30] 09/20/2014  . Gastric ulcer [K25.9] 09/20/2014  . Fast heart beat [R00.0] 09/20/2014  . Temporary cerebral vascular dysfunction [G93.9] 09/20/2014  . Has a tremor [R25.1] 09/20/2014  . Adynamia [G72.3] 09/20/2014  . Classical migraine with intractable migraine [G43.119] 08/10/2013  . Dysphagia, unspecified(787.20) [R13.10] 08/05/2012  . Paraesophageal hernia [K44.9] 08/04/2012    Total Time spent with patient: 45 minutes  Subjective:   Gail Hardy is a 71 y.o. female patient with a history of brain aneurysm in the past, major depressive disorder, glaucoma,  hyperlipidemia presented to the hospital with slurred speech and a history of being slightly confused and lethargic.  Speaking to her husband Gail Hardy, he endorses that she had slurred speech and seemed a little slow to respond but was not confused.  Says that she takes the psychotropics regularly and these medications have allowed her to live a good quality of life.  Denies any recent onset sadness of mood, crying spells, feeling hopeless or worthless.  Says she has been active and about until 3-4 days ago.  Communicates to me that they have been told by primary team that she could possibly have a pneumonia.  She has been admitted in the past for exacerbation of depressive symptoms.  Does not see a psychiatrist and is prescribed psychotropic medications by her primary care doctor.  Takes Abilify 30 mg, bupropion sustained release 150 mg twice a day, temazepam 30 mg at bedtime, citalopram 40 mg daily, Adderall 10 mg twice a day and gabapentin 400 mg twice daily.  She has been contending with hip pain and uses a walker at home.  Has had a history of UTI in the past where and she became confused and had slurring of speech.  Patient is able to respond to questions, denies any sadness of mood or feeling hopeless.  Denies history of crying spells or suicidal thoughts.  Is able to tell me the date, place and person correctly.  Denies any symptoms suggestive of auditory or visual hallucinations.  She is able to sleep well at night and has had no trouble with the same.  Past Psychiatric History: Has a history of MDD and anxiety in the past.  Her husband endorses that she has been admitted for depression in the past.  Risk to Self: Is patient at risk for suicide?: No Risk to Others:  No Prior Inpatient Therapy:  Has been admitted for MDD in the past.  Also diagnosed with bipolar disorder.  Patient or husband is unable to describe typical hypomanic or manic symptoms.  Husband does describe that she becomes irritable  once she is in a depressive episode . Prior Outpatient Therapy:  Does not see a psychiatrist and is prescribed psychotropics by her primary care provider  Past Medical History:  Past Medical History:  Diagnosis Date  . Anemia   . Aneurysm ([place]) 2011  . Blood transfusion without reported diagnosis 2013,2014   x 2  . Cancer ([place])   . Depression   . Family history of adverse reaction to anesthesia    daughter nausea and vomiting  . Glaucoma   . Hernia   . Hyperlipidemia   . Pulmonary emboli ([place]) 2011  . Stroke [facility]) 2010    Past Surgical History:  Procedure Laterality Date  . ABDOMINAL HYSTERECTOMY    . BREAST EXCISIONAL BIOPSY Right    pt states she had "lumps removed from breast as a teen"  . CEREBRAL ANEURYSM REPAIR  2011   stent and coil insertion  . CHOLECYSTECTOMY    . COLONOSCOPY  2013   [facility]  . HERNIA REPAIR  2014   lap paraesophageal repair  . INTRAMEDULLARY (IM) NAIL INTERTROCHANTERIC Left 10/19/2014   Procedure: INTRAMEDULLARY (IM) NAIL INTERTROCHANTRIC;  Surgeon: Earnestine Leys, MD;  Location: [facility] ORS;  Service: Orthopedics;  Laterality: Left;  . UPPER GI ENDOSCOPY  2013, 2014   [facility]   Family History:  Family History  Problem Relation Age of Onset  . Emphysema Father   . COPD Mother   . Heart disease Mother   . Heart disease Brother   . Dementia Brother   . Liver disease Brother   .  Heart disease Brother    Family Psychiatric  History: Denies Social History:  Social History   Substance and Sexual Activity  Alcohol Use No     Social History   Substance and Sexual Activity  Drug Use No    Social History   Socioeconomic History  . Marital status: Married    Spouse name: Gail Hardy  . Number of children: 2  . Years of education: None  . Highest education level: None  Social Needs  . Financial resource strain: None  . Food insecurity - worry: None  . Food insecurity - inability: None  . Transportation needs - medical: None  .  Transportation needs - non-medical: None  Occupational History  . None  Tobacco Use  . Smoking status: Former Smoker    Years: 10.00    Types: Cigarettes    Last attempt to quit: 02/10/1989    Years since quitting: 27.9  . Smokeless tobacco: Never Used  Substance and Sexual Activity  . Alcohol use: No  . Drug use: No  . Sexual activity: No  Other Topics Concern  . None  Social History Narrative  . None   Additional Social History:    Allergies:   Allergies  Allergen Reactions  . Diazepam     Other reaction(s): Feeling agitated (finding)    Labs:  Results for orders placed or performed during the hospital encounter of 01/01/17 (from the past 48 hour(s))  Protime-INR     Status: None   Collection Time: 01/01/17  1:39 PM  Result Value Ref Range   Prothrombin Time 12.8 11.4 - 15.2 seconds   INR 0.97   APTT     Status: None   Collection Time: 01/01/17  1:39 PM  Result Value Ref Range   aPTT 27 24 - 36 seconds  CBC     Status: None   Collection Time: 01/01/17  1:39 PM  Result Value Ref Range   WBC 6.1 3.6 - 11.0 K/uL   RBC 5.18 3.80 - 5.20 MIL/uL   Hemoglobin 15.5 12.0 - 16.0 g/dL   HCT 46.0 35.0 - 47.0 %   MCV 88.9 80.0 - 100.0 fL   MCH 30.0 26.0 - 34.0 pg   MCHC 33.8 32.0 - 36.0 g/dL   RDW 14.2 11.5 - 14.5 %   Platelets 257 150 - 440 K/uL  Differential     Status: None   Collection Time: 01/01/17  1:39 PM  Result Value Ref Range   Neutrophils Relative % 51 %   Neutro Abs 3.1 1.4 - 6.5 K/uL   Lymphocytes Relative 35 %   Lymphs Abs 2.1 1.0 - 3.6 K/uL   Monocytes Relative 11 %   Monocytes Absolute 0.6 0.2 - 0.9 K/uL   Eosinophils Relative 2 %   Eosinophils Absolute 0.1 0 - 0.7 K/uL   Basophils Relative 1 %   Basophils Absolute 0.1 0 - 0.1 K/uL  Comprehensive metabolic panel     Status: Abnormal   Collection Time: 01/01/17  1:39 PM  Result Value Ref Range   Sodium 140 135 - 145 mmol/L   Potassium 3.5 3.5 - 5.1 mmol/L   Chloride 105 101 - 111 mmol/L   CO2  23 22 - 32 mmol/L   Glucose, Bld 108 (H) 65 - 99 mg/dL   BUN 13 6 - 20 mg/dL   Creatinine, Ser 0.68 0.44 - 1.00 mg/dL   Calcium 9.5 8.9 - 10.3 mg/dL   Total Protein 7.9 6.5 - 8.1  g/dL   Albumin 4.2 3.5 - 5.0 g/dL   AST 28 15 - 41 U/L   ALT 20 14 - 54 U/L   Alkaline Phosphatase 83 38 - 126 U/L   Total Bilirubin 1.2 0.3 - 1.2 mg/dL   GFR calc non Af Amer >60 >60 mL/min   GFR calc Af Amer >60 >60 mL/min    Comment: (NOTE) The eGFR has been calculated using the CKD EPI equation. This calculation has not been validated in all clinical situations. eGFR's persistently <60 mL/min signify possible Chronic Kidney Disease.    Anion gap 12 5 - 15  Troponin I     Status: None   Collection Time: 01/01/17  1:39 PM  Result Value Ref Range   Troponin I <0.03 <0.03 ng/mL  Urinalysis, Complete w Microscopic     Status: Abnormal   Collection Time: 01/01/17  2:00 PM  Result Value Ref Range   Color, Urine STRAW (A) YELLOW   APPearance CLEAR (A) CLEAR   Specific Gravity, Urine 1.008 1.005 - 1.030   pH 7.0 5.0 - 8.0   Glucose, UA NEGATIVE NEGATIVE mg/dL   Hgb urine dipstick SMALL (A) NEGATIVE   Bilirubin Urine NEGATIVE NEGATIVE   Ketones, ur NEGATIVE NEGATIVE mg/dL   Protein, ur NEGATIVE NEGATIVE mg/dL   Nitrite NEGATIVE NEGATIVE   Leukocytes, UA NEGATIVE NEGATIVE   RBC / HPF 0-5 0 - 5 RBC/hpf   WBC, UA NONE SEEN 0 - 5 WBC/hpf   Bacteria, UA NONE SEEN NONE SEEN   Squamous Epithelial / LPF NONE SEEN NONE SEEN  Culture, blood (routine x 2)     Status: None (Preliminary result)   Collection Time: 01/01/17  4:13 PM  Result Value Ref Range   Specimen Description BLOOD RIGHT ANTECUBITAL    Special Requests      BOTTLES DRAWN AEROBIC AND ANAEROBIC Blood Culture results may not be optimal due to an inadequate volume of blood received in culture bottles   Culture NO GROWTH 2 DAYS    Report Status PENDING   Culture, blood (routine x 2)     Status: None (Preliminary result)   Collection Time:  01/01/17  4:21 PM  Result Value Ref Range   Specimen Description BLOOD LEFT ANTECUBITAL    Special Requests      BOTTLES DRAWN AEROBIC AND ANAEROBIC Blood Culture results may not be optimal due to an inadequate volume of blood received in culture bottles   Culture NO GROWTH 2 DAYS    Report Status PENDING   Troponin I     Status: None   Collection Time: 01/01/17  8:24 PM  Result Value Ref Range   Troponin I <0.03 <0.03 ng/mL  Lipid panel     Status: Abnormal   Collection Time: 01/02/17  2:36 AM  Result Value Ref Range   Cholesterol 265 (H) 0 - 200 mg/dL   Triglycerides 170 (H) <150 mg/dL   HDL 70 >40 mg/dL   Total CHOL/HDL Ratio 3.8 RATIO   VLDL 34 0 - 40 mg/dL   LDL Cholesterol 161 (H) 0 - 99 mg/dL    Comment:        Total Cholesterol/HDL:CHD Risk Coronary Heart Disease Risk Table                     Men   Women  1/2 Average Risk   3.4   3.3  Average Risk       5.0   4.4  2  X Average Risk   9.6   7.1  3 X Average Risk  23.4   11.0        Use the calculated Patient Ratio above and the CHD Risk Table to determine the patient's CHD Risk.        ATP III CLASSIFICATION (LDL):  <100     mg/dL   Optimal  100-129  mg/dL   Near or Above                    Optimal  130-159  mg/dL   Borderline  160-189  mg/dL   High  >190     mg/dL   Very High   CBC     Status: None   Collection Time: 01/02/17  2:36 AM  Result Value Ref Range   WBC 7.4 3.6 - 11.0 K/uL   RBC 4.87 3.80 - 5.20 MIL/uL   Hemoglobin 14.9 12.0 - 16.0 g/dL   HCT 43.3 35.0 - 47.0 %   MCV 88.9 80.0 - 100.0 fL   MCH 30.6 26.0 - 34.0 pg   MCHC 34.4 32.0 - 36.0 g/dL   RDW 14.0 11.5 - 14.5 %   Platelets 233 150 - 440 K/uL  Basic metabolic panel     Status: Abnormal   Collection Time: 01/02/17  2:36 AM  Result Value Ref Range   Sodium 139 135 - 145 mmol/L   Potassium 3.2 (L) 3.5 - 5.1 mmol/L    Comment: HEMOLYSIS AT THIS LEVEL MAY AFFECT RESULT   Chloride 103 101 - 111 mmol/L   CO2 25 22 - 32 mmol/L   Glucose, Bld  88 65 - 99 mg/dL   BUN 15 6 - 20 mg/dL   Creatinine, Ser 0.87 0.44 - 1.00 mg/dL   Calcium 9.1 8.9 - 10.3 mg/dL   GFR calc non Af Amer >60 >60 mL/min   GFR calc Af Amer >60 >60 mL/min    Comment: (NOTE) The eGFR has been calculated using the CKD EPI equation. This calculation has not been validated in all clinical situations. eGFR's persistently <60 mL/min signify possible Chronic Kidney Disease.    Anion gap 11 5 - 15  Troponin I     Status: None   Collection Time: 01/02/17  2:36 AM  Result Value Ref Range   Troponin I <0.03 <0.03 ng/mL  Troponin I     Status: None   Collection Time: 01/02/17  7:42 AM  Result Value Ref Range   Troponin I <0.03 <0.03 ng/mL  Lipid panel     Status: Abnormal   Collection Time: 01/03/17  6:42 AM  Result Value Ref Range   Cholesterol 283 (H) 0 - 200 mg/dL   Triglycerides 229 (H) <150 mg/dL   HDL 58 >40 mg/dL   Total CHOL/HDL Ratio 4.9 RATIO   VLDL 46 (H) 0 - 40 mg/dL   LDL Cholesterol 179 (H) 0 - 99 mg/dL    Comment:        Total Cholesterol/HDL:CHD Risk Coronary Heart Disease Risk Table                     Men   Women  1/2 Average Risk   3.4   3.3  Average Risk       5.0   4.4  2 X Average Risk   9.6   7.1  3 X Average Risk  23.4   11.0        Use the  calculated Patient Ratio above and the CHD Risk Table to determine the patient's CHD Risk.        ATP III CLASSIFICATION (LDL):  <100     mg/dL   Optimal  100-129  mg/dL   Near or Above                    Optimal  130-159  mg/dL   Borderline  160-189  mg/dL   High  >190     mg/dL   Very High     Current Facility-Administered Medications  Medication Dose Route Frequency Provider Last Rate Last Dose  . 0.9 %  sodium chloride infusion  250 mL Intravenous PRN Dustin Flock, MD      . acetaminophen (TYLENOL) tablet 650 mg  650 mg Oral Q6H PRN Dustin Flock, MD   650 mg at 01/02/17 1823   Or  . acetaminophen (TYLENOL) suppository 650 mg  650 mg Rectal Q6H PRN Dustin Flock, MD       . amphetamine-dextroamphetamine (ADDERALL) tablet 10 mg  10 mg Oral Q breakfast Dustin Flock, MD   10 mg at 01/03/17 1047  . ARIPiprazole (ABILIFY) tablet 30 mg  30 mg Oral Daily Dustin Flock, MD   30 mg at 01/03/17 1046  . aspirin EC tablet 81 mg  81 mg Oral Daily Salary, Montell D, MD   81 mg at 01/03/17 1047  . azithromycin (ZITHROMAX) 500 mg in dextrose 5 % 250 mL IVPB  500 mg Intravenous Q24H Nicholes Mango, MD   Stopped at 01/02/17 2053  . buPROPion (WELLBUTRIN SR) 12 hr tablet 150 mg  150 mg Oral Daily Dustin Flock, MD   150 mg at 01/03/17 1047  . capsaicin (ZOSTRIX) 0.025 % cream   Topical TID PRN Dustin Flock, MD      . cefTRIAXone (ROCEPHIN) 1 g in dextrose 5 % 50 mL injection   Intravenous Q24H Dustin Flock, MD      . clonazePAM (KLONOPIN) tablet 0.25 mg  0.25 mg Oral Q8H PRN Gouru, Aruna, MD      . diltiazem (CARDIZEM) tablet 60 mg  60 mg Oral Q8H Dustin Flock, MD   60 mg at 01/03/17 0549  . enoxaparin (LOVENOX) injection 40 mg  40 mg Subcutaneous Q12H Dustin Flock, MD   40 mg at 01/03/17 1047  . gabapentin (NEURONTIN) capsule 400 mg  400 mg Oral TID Dustin Flock, MD   400 mg at 01/03/17 1046  . latanoprost (XALATAN) 0.005 % ophthalmic solution 1 drop  1 drop Both Eyes QHS Dustin Flock, MD      . nitroGLYCERIN (NITROSTAT) SL tablet 0.4 mg  0.4 mg Sublingual Q5 min PRN Salary, Montell D, MD      . ondansetron (ZOFRAN) tablet 4 mg  4 mg Oral Q6H PRN Dustin Flock, MD       Or  . ondansetron (ZOFRAN) injection 4 mg  4 mg Intravenous Q6H PRN Dustin Flock, MD   4 mg at 01/03/17 1006  . promethazine (PHENERGAN) tablet 25 mg  25 mg Oral Q6H PRN Dustin Flock, MD      . sodium chloride flush (NS) 0.9 % injection 3 mL  3 mL Intravenous Q12H Dustin Flock, MD   3 mL at 01/03/17 1008  . sodium chloride flush (NS) 0.9 % injection 3 mL  3 mL Intravenous PRN Dustin Flock, MD      . temazepam (RESTORIL) capsule 30 mg  30 mg Oral Daily Dustin Flock, MD  30 mg  at 01/03/17 1047    Musculoskeletal: Strength & Muscle Tone: Not tested Gait & Station: Not tested Patient leans: N/A  Psychiatric Specialty Exam: Physical Exam  ROS  Blood pressure 117/70, pulse (!) 103, temperature 98.1 F (36.7 C), temperature source Oral, resp. rate 19, height 5' 1"  (1.549 m), weight 221 lb (100.2 kg), SpO2 92 %.Body mass index is 41.76 kg/m.  General Appearance: Patient lying in bed, relatively well groomed, is cooperative and pleasant.  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  71 year old lady with a previous history of MDD and possibly bipolar disorder admitted now for a history of lethargy and worsening pneumonia.  Has had brain imaging which is ruled out stroke or any other cardiovascular events.  She is on multiple psychotropic medications and Neurontin for neuropathic pain.  She is relatively sleepy and seems a little lethargic but not confused.  There does not seem to be a history of disruption in circadian rhythm or insomnia.  It is possible that she had hypoactive delirium when she came into the hospital due to physiological vulnerability imposed by the infection.  However with treatment of the infection this could have started  to improve.  For now we can make temazepam as needed at night and skip her Celexa for 2 days.  Since Wellbutrin is an activating antidepressant we will continue that.  Once her infection is better controlled and she is less lethargic we can restart these medications possibly on Sunday or Monday.  I will keep following the patient to assess her improvement and recovery from the hypoactive delirium. Delirium is similar to brain failure and the goal should be to find the underlying cause for the delirium.  For now the cause for the delirium seems to be the infection that is ongoing.  With treatment of this infection her delirium would possibly improve.  Ramond Dial, MD 01/03/2017 12:50 PM

## 2017-01-03 NOTE — Evaluation (Signed)
Clinical/Bedside Swallow Evaluation Patient Details  Name: Gail Hardy MRN: [id number] Date of Birth: 02-Aug-1945  Today's Date: 01/03/2017 Time: SLP Start Time (ACUTE ONLY): 1300 SLP Stop Time (ACUTE ONLY): 1345 SLP Time Calculation (min) (ACUTE ONLY): 45 min  Past Medical History:  Past Medical History:  Diagnosis Date  . Anemia   . Aneurysm ([place]) 2011  . Blood transfusion without reported diagnosis 2013,2014   x 2  . Cancer ([place])   . Depression   . Family history of adverse reaction to anesthesia    daughter nausea and vomiting  . Glaucoma   . Hernia   . Hyperlipidemia   . Pulmonary emboli ([place]) 2011  . Stroke [facility]) 2010   Past Surgical History:  Past Surgical History:  Procedure Laterality Date  . ABDOMINAL HYSTERECTOMY    . BREAST EXCISIONAL BIOPSY Right    pt states she had "lumps removed from breast as a teen"  . CEREBRAL ANEURYSM REPAIR  2011   stent and coil insertion  . CHOLECYSTECTOMY    . COLONOSCOPY  2013   [facility]  . HERNIA REPAIR  2014   lap paraesophageal repair  . INTRAMEDULLARY (IM) NAIL INTERTROCHANTERIC Left 10/19/2014   Procedure: INTRAMEDULLARY (IM) NAIL INTERTROCHANTRIC;  Surgeon: Earnestine Leys, MD;  Location: [facility] ORS;  Service: Orthopedics;  Laterality: Left;  . UPPER GI ENDOSCOPY  2013, 2014   Gail Hardy   HPI:  Gail Hardy is an 71 y.o. female with a history of depression and BPD who was noted to have slurred speech on 0200 on 11/21. She has had this presentation with infections in the past and was brought in for evaluation.  Patient also reports generalized weakness and fatigue.  Husband reports she has been anxious lately. According to the ED physician when ambulated her oxygen drops.  Also her heart rate is been elevated into the 120s. She has been having a cough productive of white sputum.  Patient was outside of time window for tPA.  Pt's husband present at bedside.  He reports that prior to admission pt was dependent on him for all  ADLs and iADLs.  They deny any cognitive decline from baseline.  They report slurred speech has improved.   Assessment / Plan / Recommendation Clinical Impression    Pt seen at bedside for clinical swallow evaluation as well as evaluation of speech/language.  Pt overtly tolerated thin liquid trials.  She presented with cough prior to PO which was productive (phlegm).  No overt s/sx of aspiration during PO trials.  No coughing, throat clearing, or change in vocal quality.  Hard solid trial attempted, however pt presented with labored prolonged mastication and reported it was too hard.  Pt would benefit from softer diet given oral deficits.  Oral mech exam revealed bilaterally reduced strength.  Vocal quality appears weak  and breathy - c/b respiratory status.  Pt would benefit from standard aspiration precautions as well as modified diet given such.  Recommend continue dysphagia 2 diet with unrestricted liquids and adherence to aspiration precautions.  Pt may benefit from medications being administered in puree.  SLP will follow during inpatient stay for dysphagia management.  Additionally, pt presents with mild-moderate dysarthria, which her husband reports has improved since yesterday.  Pt presented at baseline for basic expressive and receptive language tasks including confrontational naming, automatic speech, 1-2 step directions, and sentence completion.  Pt (+) baseline cognitive deficits and is dependent on her husband for ADLs and iADLs.  Direct instruction and demonstration of  dysarthria strategies completed.  Pt and her husband demonstrated understanding to such via teach back.  SLP will follow during inpatient stay for re-education on dysarthria strategies. SLP Visit Diagnosis: Dysphagia, unspecified (R13.10);Dysarthria and anarthria (R47.1)    Aspiration Risk  Mild aspiration risk    Diet Recommendation Dysphagia 2 (Fine chop);Thin liquid   Liquid Administration via: Cup;Straw Medication  Administration: Whole meds with puree Supervision: Staff to assist with self feeding;Intermittent supervision to cue for compensatory strategies Compensations: Minimize environmental distractions;Small sips/bites Postural Changes: Seated upright at 90 degrees;Remain upright for at least 30 minutes after po intake    Other  Recommendations Oral Care Recommendations: Oral care BID;Staff/trained caregiver to provide oral care   Follow up Recommendations Home health SLP      Frequency and Duration min 1 x/week  2 weeks       Prognosis Prognosis for Safe Diet Advancement: Good      Swallow Study   General Date of Onset: 01/01/17 HPI: Gail Hardy is an 71 y.o. female with a history of depression and BPD who was noted to have slurred speech on 0200 on 11/21. She has had this presentation with infections in the past and was brought in for evaluation.  Patient also reports generalized weakness and fatigue.  Husband reports she has been anxious lately. According to the ED physician when ambulated her oxygen drops.  Also her heart rate is been elevated into the 120s. She has been having a cough productive of white sputum.  Patient was outside of time window for tPA.  Pt's husband present at bedside.  He reports that prior to admission pt was dependent on him for all ADLs and iADLs.  They deny any cognitive decline from baseline.  They report slurred speech has improved. Type of Study: Bedside Swallow Evaluation Diet Prior to this Study: Dysphagia 2 (chopped);Thin liquids Temperature Spikes Noted: N/A Respiratory Status: Nasal cannula History of Recent Intubation: No Behavior/Cognition: Alert;Cooperative Oral Care Completed by SLP: No Oral Cavity - Dentition: Edentulous Vision: Functional for self-feeding Self-Feeding Abilities: Able to feed self Patient Positioning: Upright in bed Baseline Vocal Quality: Breathy;Low vocal intensity Volitional Cough: Strong Volitional Swallow: Able to  elicit    Oral/Motor/Sensory Function Overall Oral Motor/Sensory Function: Mild impairment Facial Symmetry: Within Functional Limits Facial Strength: Reduced right;Reduced left Lingual ROM: Within Functional Limits Lingual Symmetry: Within Functional Limits Lingual Strength: Reduced   Ice Chips Ice chips: Not tested   Thin Liquid Thin Liquid: Within functional limits    Nectar Thick Nectar Thick Liquid: Not tested   Honey Thick Honey Thick Liquid: Not tested   Puree Puree: Not tested   Solid   GO   Solid: Impaired Presentation: Self Fed Oral Phase Impairments: Impaired mastication Other Comments: Pt reported too difficulty to chew.  She was able to complete 1 trial, but otherwise is not appropriate for hard solids at this time d/t lack of dentition and overal labored mastication        Merikay Lesniewski 01/03/2017,3:20 PM

## 2017-01-04 MED ORDER — DOXYCYCLINE HYCLATE 100 MG PO TABS
100.0000 mg | ORAL_TABLET | Freq: Two times a day (BID) | ORAL | Status: DC
  Administered 2017-01-04 – 2017-01-05 (×3): 100 mg via ORAL
  Filled 2017-01-04 (×3): qty 1

## 2017-01-04 MED ORDER — TEMAZEPAM 15 MG PO CAPS
30.0000 mg | ORAL_CAPSULE | Freq: Every day | ORAL | Status: DC
  Administered 2017-01-04: 30 mg via ORAL
  Filled 2017-01-04: qty 2

## 2017-01-04 NOTE — Evaluation (Signed)
Physical Therapy Evaluation Patient Details Name: Gail Hardy MRN: [id number] DOB: 10-11-1945 Today's Date: 01/04/2017   History of Present Illness  71 year old white female with previous history of stroke and a brain aneurysm presented to ED on 11/21 with slurred speech and noted to be tachycardic and some hypoxia with activity.  Has PNA and cleared for PE.  PMHx:  CA, L IM nailing, migraine/tremors, glaucoma, lumbar spondylosis, OA knee, anemia, transfusion, Stroke  Clinical Impression  Pt was able to get to side of bed but could not take her far due to weakness and IV restricting her distance from bed.  Pt was able to maintain O2 sats at 93% or better on 3L but did note pulse up to 118 with movement.  Pt and husband were open to the idea of further therapy but wife is not interested in SNF.  However she is unsafe and will require a bit more help from husband than is reasonable as of today.  Follow acutely to progress her safety and strength hopefully to make her a home care candidate quickly.    Follow Up Recommendations SNF    Equipment Recommendations  None recommended by PT    Recommendations for Other Services       Precautions / Restrictions Precautions Precautions: Fall Precaution Comments: telemetry Restrictions Weight Bearing Restrictions: No      Mobility  Bed Mobility Overal bed mobility: Needs Assistance Bed Mobility: Supine to Sit;Sit to Supine Rolling: Min assist   Supine to sit: Mod assist Sit to supine: Mod assist   General bed mobility comments: used bedrail and supported trunk to sit, assisted legs and positioning upper body back to bed  Transfers Overall transfer level: Needs assistance Equipment used: Rolling walker (2 wheeled);1 person hand held assist Transfers: Sit to/from Stand Sit to Stand: Min assist         General transfer comment: worked well to sit EOB first to get comfortable  Ambulation/Gait Ambulation/Gait assistance: Min  Wellsite geologist (Feet): 12 Feet Assistive device: Rolling walker (2 wheeled);1 person hand held assist Gait Pattern/deviations: Step-to pattern;Wide base of support;Narrow base of support;Trunk flexed;Shuffle;Decreased stride length(sidesteps side of bed) Gait velocity: reduced Gait velocity interpretation: Below normal speed for age/gender General Gait Details: has IV attached to pole directly on bed frame so could not walk away from bed  Stairs            Wheelchair Mobility    Modified Rankin (Stroke Patients Only)       Balance Overall balance assessment: Needs assistance Sitting-balance support: Feet supported;Bilateral upper extremity supported Sitting balance-Leahy Scale: Fair     Standing balance support: Bilateral upper extremity supported;During functional activity Standing balance-Leahy Scale: Poor                               Pertinent Vitals/Pain Pain Assessment: Faces Faces Pain Scale: Hurts even more Pain Location: L hip with transition to sit Pain Descriptors / Indicators: Sore;Aching Pain Intervention(s): Limited activity within patient's tolerance;Monitored during session;Premedicated before session;Repositioned    Home Living Family/patient expects to be discharged to:: Private residence Living Arrangements: Spouse/significant other Available Help at Discharge: Family;Available 24 hours/day Type of Home: House Home Access: Stairs to enter Entrance Stairs-Rails: Psychiatric nurse of Steps: 3 Home Layout: One level Home Equipment: Walker - 2 wheels;Shower seat;Grab bars - tub/shower;Grab bars - toilet      Prior Function Level of Independence: Needs assistance  Gait / Transfers Assistance Needed: RW for gait, not needing O2, had chronic L hip pain  ADL's / Homemaking Assistance Needed: husband assists all housework and her care        Hand Dominance   Dominant Hand: Right    Extremity/Trunk  Assessment   Upper Extremity Assessment Upper Extremity Assessment: Generalized weakness    Lower Extremity Assessment Lower Extremity Assessment: Generalized weakness    Cervical / Trunk Assessment Cervical / Trunk Assessment: Kyphotic  Communication   Communication: No difficulties  Cognition Arousal/Alertness: Awake/alert Behavior During Therapy: WFL for tasks assessed/performed Overall Cognitive Status: Within Functional Limits for tasks assessed                                        General Comments      Exercises     Assessment/Plan    PT Assessment Patient needs continued PT services  PT Problem List Decreased strength;Decreased range of motion;Decreased activity tolerance;Decreased balance;Decreased mobility;Decreased coordination;Decreased knowledge of use of DME;Decreased safety awareness;Cardiopulmonary status limiting activity;Obesity;Pain       PT Treatment Interventions DME instruction;Gait training;Stair training;Functional mobility training;Therapeutic activities;Therapeutic exercise;Balance training;Neuromuscular re-education;Patient/family education    PT Goals (Current goals can be found in the Care Plan section)  Acute Rehab PT Goals Patient Stated Goal: get back to PLOF PT Goal Formulation: With patient/family Time For Goal Achievement: 01/18/17 Potential to Achieve Goals: Good    Frequency Min 2X/week   Barriers to discharge Inaccessible home environment stairs to enter house    Co-evaluation               AM-PAC PT "6 Clicks" Daily Activity  Outcome Measure Difficulty turning over in bed (including adjusting bedclothes, sheets and blankets)?: Unable Difficulty moving from lying on back to sitting on the side of the bed? : Unable Difficulty sitting down on and standing up from a chair with arms (e.g., wheelchair, bedside commode, etc,.)?: Unable Help needed moving to and from a bed to chair (including a wheelchair)?: A  Little Help needed walking in hospital room?: A Little Help needed climbing 3-5 steps with a railing? : Total 6 Click Score: 10    End of Session Equipment Utilized During Treatment: Gait belt;Oxygen Activity Tolerance: Patient tolerated treatment well;Patient limited by fatigue Patient left: in bed;with call bell/phone within reach;with nursing/sitter in room;with family/visitor present Nurse Communication: Mobility status PT Visit Diagnosis: Unsteadiness on feet (R26.81);Muscle weakness (generalized) (M62.81);Other symptoms and signs involving the nervous system (R29.898);Difficulty in walking, not elsewhere classified (R26.2);Pain Pain - Right/Left: Left Pain - part of body: Hip    Time: 6195-0932 PT Time Calculation (min) (ACUTE ONLY): 31 min   Charges:   PT Evaluation $PT Eval Moderate Complexity: 1 Mod PT Treatments $Gait Training: 8-22 mins   PT G Codes:   PT G-Codes **NOT FOR INPATIENT CLASS** Functional Assessment Tool Used: AM-PAC 6 Clicks Basic Mobility    Ramond Dial 01/04/2017, 3:48 PM   Mee Hives, PT MS Acute Rehab Dept. Number: [place] [facility] [place]

## 2017-01-04 NOTE — Evaluation (Signed)
Occupational Therapy Evaluation Patient Details Name: Gail Hardy MRN: [id number] DOB: 1945/03/23 Today's Date: 01/04/2017    History of Present Illness 71 year old white female with previous history of stroke and a brain aneurysm presented to ED on 11/21 with slurred speech and noted to be tachycardic and some hypoxia with activity.   Clinical Impression   Pt seen for OT evaluation this date. Prior to admission, pt was modified independent for short household distances with mobility using RW. She was unable to tolerate sitting for any length of time due to severe L hip pain, requiring her to lie down or be standing/ambulating. Pt required assist from spouse for IADL, bathing, and dressing (please see details below). Pt currently very limited by pain this date (10/10 L hip pain), min assist for rolling to R side in order for OT to place pillow which pt reported slight improvement in comfort. Pt declined to attempt further bed mobility or transfers due to pain. RN notified of pt's request for pain medication. Pt requires max assist for LB ADL tasks and min-mod assist for UB ADL tasks at this time. Pt will benefit from skilled OT services to address noted deficits and impairments in order to maximize return to PLOF and minimize risk of falls. Recommend transition to STR following hospitalization.      Follow Up Recommendations  SNF    Equipment Recommendations  Other (comment)(TBD at next venue of care)    Recommendations for Other Services       Precautions / Restrictions Precautions Precautions: Fall Restrictions Weight Bearing Restrictions: No      Mobility Bed Mobility Overal bed mobility: Needs Assistance Bed Mobility: Rolling Rolling: Min assist         General bed mobility comments: increased time/effort and min assist with verbal cues for hand placement to improve performance to roll to R side in order for OT to place pillow under L hip with pt noting improved  comfort  Transfers                 General transfer comment: unable to attempt due to pain, pt reports staff have provided max A +2 for bed mobility, transfer BSC<>EOB    Balance Overall balance assessment: Needs assistance                                         ADL either performed or assessed with clinical judgement   ADL Overall ADL's : Needs assistance/impaired Eating/Feeding: Set up;Modified independent;Bed level Eating/Feeding Details (indicate cue type and reason): Pt uses hands due to tremors. Pt/spouse note pt has attempted using built up handles and weighted utensils before without success. Grooming: Bed level;Set up   Upper Body Bathing: Bed level;Minimal assistance;Moderate assistance   Lower Body Bathing: Bed level;Maximal assistance   Upper Body Dressing : Bed level;Minimal assistance;Moderate assistance   Lower Body Dressing: Bed level;Maximal assistance     Toilet Transfer Details (indicate cue type and reason): unable to attempt due to pain                 Vision Baseline Vision/History: Wears glasses Wears Glasses: At all times Patient Visual Report: No change from baseline Vision Assessment?: No apparent visual deficits     Perception     Praxis      Pertinent Vitals/Pain Pain Assessment: 0-10 Pain Score: 10-Worst pain ever Pain Location: L hip Pain Descriptors /  Indicators: Aching;Constant;Grimacing Pain Intervention(s): Limited activity within patient's tolerance;Monitored during session;Repositioned;Patient requesting pain meds-RN notified     Hand Dominance Right   Extremity/Trunk Assessment Upper Extremity Assessment Upper Extremity Assessment: RUE deficits/detail;LUE deficits/detail;Generalized weakness RUE Deficits / Details: grossly 3+/5, ROM WFL, pt reports tremors at baseline due to medications, no tremors noted during assessment.  LUE Deficits / Details: grossly 3/5, shoulder flexion AROM approx 90  degrees; pt reports tremors at baseline due to medications, mild tremor noted during grip strength testing   Lower Extremity Assessment Lower Extremity Assessment: Generalized weakness;Defer to PT evaluation(at least 3/5 bilaterally, L>R weakness)       Communication Communication Communication: No difficulties   Cognition Arousal/Alertness: Awake/alert Behavior During Therapy: WFL for tasks assessed/performed Overall Cognitive Status: Within Functional Limits for tasks assessed                                     General Comments       Exercises Other Exercises Other Exercises: Pt/spouse verbalized understanding of education provided in home/routines modifications to maximize safety/functional independence.    Shoulder Instructions      Home Living Family/patient expects to be discharged to:: Private residence Living Arrangements: Spouse/significant other Available Help at Discharge: Family;Available 24 hours/day Type of Home: House Home Access: Stairs to enter [employer] of Steps: 3 Entrance Stairs-Rails: Right;Left(not able to reach both) Home Layout: One level     Bathroom Shower/Tub: Occupational psychologist: Standard Bathroom Accessibility: Yes   Home Equipment: Environmental consultant - 2 wheels;Shower seat;Grab bars - tub/shower;Grab bars - toilet          Prior Functioning/Environment Level of Independence: Needs assistance  Gait / Transfers Assistance Needed: was ambulating with RW, had difficulty sitting for any length of time due to severe L hip pain ADL's / Homemaking Assistance Needed: requires assist from spouse for driving, cooking, cleaning, LB dressing, donning a bra, and drying after showers.    Comments: Endorses 1 fall in past 12 months - tripped over the dog        OT Problem List: Decreased strength;Decreased knowledge of use of DME or AE;Decreased activity tolerance;Pain;Impaired balance (sitting and/or standing)       OT Treatment/Interventions: Self-care/ADL training;Therapeutic exercise;Therapeutic activities;Energy conservation;DME and/or AE instruction;Patient/family education    OT Goals(Current goals can be found in the care plan section) Acute Rehab OT Goals Patient Stated Goal: get back to PLOF OT Goal Formulation: With patient/family Time For Goal Achievement: 01/18/17 Potential to Achieve Goals: Good  OT Frequency: Min 1X/week   Barriers to D/C: Inaccessible home environment;Decreased caregiver support          Co-evaluation              AM-PAC PT "6 Clicks" Daily Activity     Outcome Measure Help from another person eating meals?: A Little Help from another person taking care of personal grooming?: A Little Help from another person toileting, which includes using toliet, bedpan, or urinal?: A Lot Help from another person bathing (including washing, rinsing, drying)?: A Lot Help from another person to put on and taking off regular upper body clothing?: A Lot Help from another person to put on and taking off regular lower body clothing?: A Lot 6 Click Score: 14   End of Session Nurse Communication: Patient requests pain meds  Activity Tolerance: Patient limited by pain Patient left: in bed;with call bell/phone within  reach;with bed alarm set;with family/visitor present  OT Visit Diagnosis: Other abnormalities of gait and mobility (R26.89);Pain;Muscle weakness (generalized) (M62.81) Pain - Right/Left: Left Pain - part of body: Hip                Time: 5697-9480 OT Time Calculation (min): 26 min Charges:  OT General Charges $OT Visit: 1 Visit OT Evaluation $OT Eval Low Complexity: 1 Low OT Treatments $Self Care/Home Management : 8-22 mins G-Codes: OT G-codes **NOT FOR INPATIENT CLASS** Functional Assessment Tool Used: AM-PAC 6 Clicks Daily Activity;Clinical judgement Functional Limitation: Self care Self Care Current Status (X6553): At least 40 percent but less than 60  percent impaired, limited or restricted Self Care Goal Status (Z4827): At least 20 percent but less than 40 percent impaired, limited or restricted   Jeni Salles, MPH, MS, OTR/L ascom [phone] 01/04/17, 11:50 AM

## 2017-01-04 NOTE — Clinical Social Work Note (Signed)
CSW aware via chart review that OT has recommended STR. The PT recommendation is pending. CSW will assess when able.  Santiago Bumpers, MSW, Latanya Presser [phone]

## 2017-01-04 NOTE — Therapy (Signed)
Speech Language Pathology Dysphagia Treatment Patient Details Name: Gail Hardy MRN: [id number] DOB: 11-10-1945 Today's Date: 01/04/2017 Time:  -      ST consulted NSG who stated pt was [facility] and has not been noted to have coughing with meds or meals. SLP educated to notify ST if changes occur. SLP will follow in 1-2 days as needed.   [place] Sauber 01/04/2017, 10:34 AM

## 2017-01-04 NOTE — Plan of Care (Signed)
  Clinical Measurements: Ability to maintain clinical measurements within normal limits will improve 01/04/2017 1644 - Not Progressing by Daron Offer, RN Note Potassium level = only 3.2 today. Will continue to monitor. Wenda Low [facility]

## 2017-01-04 NOTE — Clinical Social Work Note (Signed)
Clinical Social Work Assessment  Patient Details  Name: Gail Hardy MRN: [id number] Date of Birth: 1945/12/17  Date of referral:  01/04/17               Reason for consult:  Facility Placement                Permission sought to share information with:  Chartered certified accountant granted to share information::  No  Name::        Agency::     Relationship::     Contact Information:     Housing/Transportation Living arrangements for the past 2 months:  Single Family Home Source of Information:  Patient, Medical Team, Spouse Patient Interpreter Needed:  None Criminal Activity/Legal Involvement Pertinent to Current Situation/Hospitalization:  No - Comment as needed Significant Relationships:  Adult Children, Spouse Lives with:  Spouse Do you feel safe going back to the place where you live?  Yes Need for family participation in patient care:  No (Coment)  Care giving concerns:  PT recommendation for STR   Social Worker assessment / plan:  CSW met with the patient and her husband at bedside to discuss discharge planning. The patient and her husband vehemently declined STR. Their plan is for the patient to return home with home health and continued oxygen. The patient's husband is able to support her 24 hours, and their daughter is available for caregiver respite. The CSW has alerted the [facility]. CSW is signing off. Please consult should other needs arise.  Employment status:  Retired Nurse, adult PT Recommendations:  [place] / Referral to community resources:     Patient/Family's Response to care:  The patient thanked the CSW for assistance.  Patient/Family's Understanding of and Emotional Response to Diagnosis, Current Treatment, and Prognosis:  The patient and her spouse understand the PT recommendation and disagree. The patient has chosen to discharge home with home health and the support of her  family.  Emotional Assessment Appearance:  Appears stated age Attitude/Demeanor/Rapport:  (Pleasant, cooperative) Affect (typically observed):  Appropriate, Pleasant Orientation:  Oriented to Self, Oriented to Place, Oriented to  Time, Oriented to Situation Alcohol / Substance use:  Never Used Psych involvement (Current and /or in the community):  No (Comment)  Discharge Needs  Concerns to be addressed:  Patient refuses services, Care Coordination, Discharge Planning Concerns Readmission within the last 30 days:  No Current discharge risk:  Chronically ill Barriers to Discharge:  Continued Medical Work up   [employer], LCSW 01/04/2017, 4:05 PM

## 2017-01-04 NOTE — Progress Notes (Signed)
[name] NAME: Gail Hardy    MR#:  [id number]  DATE OF BIRTH:  02-09-1946  SUBJECTIVE:  CHIEF COMPLAINT: The patient's more alert today speech is still better than yesterday diffusely weak, has chronic left-sided weakness from previous stroke.  Husband at bedside  REVIEW OF SYSTEMS:  CONSTITUTIONAL: No fever, fatigue or weakness.  EYES: No blurred or double vision.  EARS, NOSE, AND THROAT: No tinnitus or ear pain.  RESPIRATORY: No cough, shortness of breath, wheezing or hemoptysis.  CARDIOVASCULAR: No chest pain, orthopnea, edema.  GASTROINTESTINAL: No nausea, vomiting, diarrhea or abdominal pain.  GENITOURINARY: No dysuria, hematuria.  ENDOCRINE: No polyuria, nocturia,  HEMATOLOGY: No anemia, easy bruising or bleeding SKIN: No rash or lesion. MUSCULOSKELETAL: No joint pain or arthritis.   NEUROLOGIC: Reporting slurry speech and worsening of extremity weakness, has chronic left-sided weakness from previous stroke .  PSYCHIATRY: No anxiety or depression.   DRUG ALLERGIES:   Allergies  Allergen Reactions  . Diazepam     Other reaction(s): Feeling agitated (finding)    VITALS:  Blood pressure 132/61, pulse (!) 105, temperature 98 F (36.7 C), temperature source Oral, resp. rate 18, height 5\' 1"  (1.549 m), weight 100.2 kg (221 lb), SpO2 91 %.  PHYSICAL EXAMINATION:  GENERAL:  71 y.o.-year-old patient lying in the bed with no acute distress.  EYES: Pupils equal, round, reactive to light and accommodation. No scleral icterus. Extraocular muscles intact.  HEENT: Head atraumatic, normocephalic. Oropharynx and nasopharynx clear.  NECK:  Supple, no jugular venous distention. No thyroid enlargement, no tenderness.  LUNGS: Normal breath sounds bilaterally, no wheezing, rales,rhonchi or crepitation. No use of accessory muscles of respiration.  CARDIOVASCULAR: S1, S2 normal. No murmurs, rubs, or gallops.  ABDOMEN: Soft,  nontender, nondistended. Bowel sounds present. No organomegaly or mass.  EXTREMITIES: No pedal edema, cyanosis, or clubbing.  NEUROLOGIC: Cranial nerves II through XII are intact. Muscle strength 3/5 in all extremities. Sensation intact. Gait not checked.  PSYCHIATRIC: The patient is alert and oriented x 3.  SKIN: No obvious rash, lesion, or ulcer.    LABORATORY PANEL:   CBC Recent Labs  Lab 01/02/17 0236  WBC 7.4  HGB 14.9  HCT 43.3  PLT 233   ------------------------------------------------------------------------------------------------------------------  Chemistries  Recent Labs  Lab 01/01/17 1339 01/02/17 0236  NA 140 139  K 3.5 3.2*  CL 105 103  CO2 23 25  GLUCOSE 108* 88  BUN 13 15  CREATININE 0.68 0.87  CALCIUM 9.5 9.1  AST 28  --   ALT 20  --   ALKPHOS 83  --   BILITOT 1.2  --    ------------------------------------------------------------------------------------------------------------------  Cardiac Enzymes Recent Labs  Lab 01/02/17 0742  TROPONINI <0.03   ------------------------------------------------------------------------------------------------------------------  RADIOLOGY:  No results found.  EKG:   Orders placed or performed during the hospital encounter of 01/01/17  . ED EKG  . ED EKG  . EKG 12-Lead  . EKG 12-Lead  . EKG 12-Lead  . EKG 12-Lead    ASSESSMENT AND PLAN:    Patient is a 71 year old white female with previous history of stroke and a brain aneurysm is presenting with slurred speech noted to be tachycardic and some hypoxia with activity  1.    Dysarthria-could be from sedating medications Clinically better MRI  brain negative CT head negative, Continue aspirin Neurology recommending to decrease the sedation meds DC'd morphine, Decrease Klonopin dose  2.  Possible pneumonia based on her  chest x-ray CT [name] negative for pulmonary embolism or any other acute findings On IV Rocephin and azithromycin for  possible pneumonia, discontinue iv antibiotics  P.o. Augmentin Speech therapy recommended dysphagia 2 diet  3.  Sinus tachycardia could be from anxiety  improved clinically with Cardizem low-dose  on telemetry monitoring  4.  Chronic history of depression and  Generalized anxiety disorder c Denies any acute episodes of anxiety or worsening of depression Psych recommending to  reduce the dose of temazepam to 15 mg at night and skip her Celexa and Wellbutrin for 2 days    5.  DVT prophylaxis Lovenox for dvt proph   PT eval recommending skilled nursing facility follow-up with case manager regarding placement  All the records are reviewed and case discussed with Care Management/Social Workerr. Management plans discussed with the patient, her husband Gail Hardy at bedside, they are in agreement.  CODE STATUS: fc , husband Gail Hardy is the healthcare power of attorney message left to call back  TOTAL TIME TAKING CARE OF THIS PATIENT: 36 minutes.   POSSIBLE D/C IN 1-2 DAYS, DEPENDING ON CLINICAL CONDITION.  Note: This dictation was prepared with Dragon dictation along with smaller phrase technology. Any transcriptional errors that result from this process are unintentional.   Nicholes Mango M.D on 01/04/2017 at 1:44 PM  Between 7am to 6pm - Pager - [phone] After 6pm go to [email or web address] - password [email or web address] [facility]  [facility]  Office  [phone]  CC: Primary care physician; Jerrol Banana., MD

## 2017-01-05 MED ORDER — DILTIAZEM HCL 60 MG PO TABS: 60.0000 mg | ORAL_TABLET | Freq: Three times a day (TID) | ORAL | 0 refills | Status: DC

## 2017-01-05 MED ORDER — BUPROPION HCL ER (XL) 150 MG PO TB24
150.0000 mg | ORAL_TABLET | Freq: Every day | ORAL | Status: DC
  Administered 2017-01-05: 150 mg via ORAL
  Filled 2017-01-05: qty 1

## 2017-01-05 MED ORDER — ACETAMINOPHEN-CODEINE #3 300-30 MG PO TABS: 1.0000 | ORAL_TABLET | Freq: Three times a day (TID) | ORAL | 0 refills | Status: DC | PRN

## 2017-01-05 MED ORDER — BUPROPION HCL ER (XL) 150 MG PO TB24: 150.0000 mg | ORAL_TABLET | Freq: Every day | ORAL | 0 refills | Status: DC

## 2017-01-05 MED ORDER — CITALOPRAM HYDROBROMIDE 40 MG PO TABS: 40.0000 mg | ORAL_TABLET | Freq: Every day | ORAL | 0 refills | Status: AC

## 2017-01-05 MED ORDER — CLONAZEPAM 0.5 MG PO TABS: 0.2500 mg | ORAL_TABLET | Freq: Three times a day (TID) | ORAL | 0 refills | Status: DC | PRN

## 2017-01-05 MED ORDER — CITALOPRAM HYDROBROMIDE 20 MG PO TABS
40.0000 mg | ORAL_TABLET | Freq: Every day | ORAL | Status: DC
  Administered 2017-01-05: 40 mg via ORAL
  Filled 2017-01-05: qty 2

## 2017-01-05 NOTE — Plan of Care (Signed)
  Adequate for Discharge Education: Knowledge of General Education information will improve 01/05/2017 1353 - Adequate for Discharge by Feliberto Gottron, RN Clinical Measurements: Ability to maintain clinical measurements within normal limits will improve 01/05/2017 1353 - Adequate for Discharge by Chriss Czar, Illene Bolus, RN Will remain free from infection 01/05/2017 1353 - Adequate for Discharge by Feliberto Gottron, RN Diagnostic test results will improve 01/05/2017 1353 - Adequate for Discharge by Feliberto Gottron, RN Respiratory complications will improve 01/05/2017 1353 - Adequate for Discharge by Feliberto Gottron, RN Cardiovascular complication will be avoided 01/05/2017 1353 - Adequate for Discharge by Feliberto Gottron, RN Activity: Risk for activity intolerance will decrease 01/05/2017 1353 - Adequate for Discharge by Feliberto Gottron, RN Elimination: Will not experience complications related to bowel motility 01/05/2017 1353 - Adequate for Discharge by Chriss Czar, Illene Bolus, RN Will not experience complications related to urinary retention 01/05/2017 1353 - Adequate for Discharge by Feliberto Gottron, RN Pain Managment: General experience of comfort will improve 01/05/2017 1353 - Adequate for Discharge by Feliberto Gottron, RN Safety: Ability to remain free from injury will improve 01/05/2017 1353 - Adequate for Discharge by Feliberto Gottron, RN Skin Integrity: Risk for impaired skin integrity will decrease 01/05/2017 1353 - Adequate for Discharge by Feliberto Gottron, RN Cardiac: Ability to achieve and maintain adequate cardiopulmonary perfusion will improve 01/05/2017 1353 - Adequate for Discharge by Feliberto Gottron, RN Respiratory: Ability to maintain adequate ventilation will improve 01/05/2017 1353 - Adequate for Discharge by Feliberto Gottron, RN Ability to maintain a  clear airway will improve 01/05/2017 1353 - Adequate for Discharge by Feliberto Gottron, RN

## 2017-01-05 NOTE — Care Management Note (Signed)
Case Management Note  Patient Details  Name: Gail Hardy MRN: [id number] Date of Birth: [birth year]  Subjective/Objective:     Discharge to home with no new home health orders. Husband will provide 24/7 care at home and daughter will provide respite care. Family refused offer of SNF placement.                Action/Plan:   Expected Discharge Date:  01/05/17               Expected Discharge Plan:     In-House Referral:     Discharge planning Services     Post Acute Care Choice:    Choice offered to:     DME Arranged:    DME Agency:     HH Arranged:    HH Agency:     Status of Service:     If discussed at [employer], dates discussed:    Additional Comments:  Clark Clowdus A, RN 01/05/2017, 10:03 AM

## 2017-01-05 NOTE — Progress Notes (Signed)
Ambulated patient on room air from bed to the hallway. Pt's O2 saturation ranged from 88%-95%. When it went down to 88%, it went back up to the 90's fairly quick. Pt had no complaints of shortness of breath or dizziness. Pt back in the bed with no complaints.  Will continue to monitor.

## 2017-01-05 NOTE — Discharge Summary (Signed)
[facility] NAME: Gail Hardy    MR#:  [id number]  DATE OF BIRTH:  1945-06-30  DATE OF ADMISSION:  01/01/2017 ADMITTING PHYSICIAN: Dustin Flock, MD  DATE OF DISCHARGE: 01/05/17  PRIMARY CARE PHYSICIAN: Jerrol Banana., MD    ADMISSION DIAGNOSIS:  Slurred speech [R47.81] Community acquired pneumonia, unspecified laterality [J18.9] Pneumonia [J18.9]  DISCHARGE DIAGNOSIS:  Delirium Dysarthria resolved  SECONDARY DIAGNOSIS:   Past Medical History:  Diagnosis Date  . Anemia   . Aneurysm ([place]) 2011  . Blood transfusion without reported diagnosis 2013,2014   x 2  . Cancer ([place])   . Depression   . Family history of adverse reaction to anesthesia    daughter nausea and vomiting  . Glaucoma   . Hernia   . Hyperlipidemia   . Pulmonary emboli ([place]) 2011  . Stroke [facility]) 2010    HOSPITAL COURSE:   HISTORY OF PRESENT ILLNESS: Gail Hardy  is a 71 y.o. female with a known history of brain aneurysm in the past, depression, glaucoma, hyperlipidemia who was brought to the hospital with slurred speech.  She is also been very weak according to her husband.  Patient according to the ED physician when ambulated her oxygen drops.  Also her heart rate is been elevated into the 120s.  According to the patient and her husband she has not been able to walk much due to hip pain.  She has been having a cough productive of white sputum.  She denies any chest pain or palpitations.  Denies any other asymmetrical weakness.  Denies any visual difficulties  1.  Dysarthria- from sedating medications Clinically better MRI  brain negative CT head negative, Neurology recommending to decrease the sedation meds DC'd morphine, Decreased Klonopin dose  2.Possible pneumonia ? based on her chest x-ray CT [name] negative for pulmonary embolism or any other acute findings On IV Rocephin and azithromycin for possible pneumonia,  discontinued iv antibiotics  Completed antibiotic course Speech therapy recommended dysphagia 2 diet  3.Sinus tachycardia could be from anxiety  improved clinically with Cardizem low-dose  on telemetry monitoring  4.  Chronic history of depression andGeneralized anxiety disorder c Denies any acute episodes of anxiety or worsening of depression Psych recommending to  continue temazepam at night and resume her Celexa and Wellbutrin     DISCHARGE CONDITIONS:   stable  CONSULTS OBTAINED:  Treatment Team:  Alexis Goodell, MD Ramond Dial, MD   PROCEDURES  None   DRUG ALLERGIES:   Allergies  Allergen Reactions  . Diazepam     Other reaction(s): Feeling agitated (finding)    DISCHARGE MEDICATIONS:   Current Discharge Medication List    START taking these medications   Details  buPROPion (WELLBUTRIN XL) 150 MG 24 hr tablet Take 1 tablet (150 mg total) by mouth daily. Qty: 30 tablet, Refills: 0    diltiazem (CARDIZEM) 60 MG tablet Take 1 tablet (60 mg total) by mouth every 8 (eight) hours. Qty: 90 tablet, Refills: 0      CONTINUE these medications which have CHANGED   Details  acetaminophen-codeine (TYLENOL #3) 300-30 MG tablet Take 1 tablet by mouth every 8 (eight) hours as needed for moderate pain. Qty: 15 tablet, Refills: 0    citalopram (CELEXA) 40 MG tablet Take 1 tablet (40 mg total) by mouth daily. Qty: 30 tablet, Refills: 0    clonazePAM (KLONOPIN) 0.5 MG tablet Take 0.5 tablets (0.25 mg total) by mouth  every 8 (eight) hours as needed (anxiety). Qty: 30 tablet, Refills: 0      CONTINUE these medications which have NOT CHANGED   Details  amphetamine-dextroamphetamine (ADDERALL) 10 MG tablet TAKE 1 TABLET BY MOUTH TWICE (2) DAILY Qty: 60 tablet, Refills: 0    ARIPiprazole (ABILIFY) 30 MG tablet TAKE 1 TABLET BY MOUTH ONCE DAILY Qty: 30 tablet, Refills: 11    gabapentin (NEURONTIN) 400 MG capsule Take 1 capsule (400 mg total) by mouth 3  (three) times daily. Qty: 270 capsule, Refills: 3   Associated Diagnoses: Restless leg    latanoprost (XALATAN) 0.005 % ophthalmic solution Place 1 drop into both eyes at bedtime.     temazepam (RESTORIL) 30 MG capsule Take 1 capsule (30 mg total) by mouth daily. Qty: 30 capsule, Refills: 5    capsaicin (ZOSTRIX) 0.025 % cream Apply topically 3 (three) times daily. Qty: 60 g, Refills: 0    promethazine (PHENERGAN) 25 MG tablet TAKE 1/2 TO 1 TABLET BY MOUTH EVERY 6 HOURS AS NEEDED Qty: 100 tablet, Refills: 0      STOP taking these medications     buPROPion (WELLBUTRIN SR) 150 MG 12 hr tablet          DISCHARGE INSTRUCTIONS:    Follow-up with primary care physician in 1 week Follow-up with psychiatry in 1 week DIET:  Cardiac diet  DISCHARGE CONDITION:  Stable  ACTIVITY:  Activity as tolerated  OXYGEN:  Home Oxygen: No.   Oxygen Delivery: room air  DISCHARGE LOCATION:  home   If you experience worsening of your admission symptoms, develop shortness of breath, life threatening emergency, suicidal or homicidal thoughts you must seek medical attention immediately by calling 911 or calling your MD immediately  if symptoms less severe.  You Must read complete instructions/literature along with all the possible adverse reactions/side effects for all the Medicines you take and that have been prescribed to you. Take any new Medicines after you have completely understood and accpet all the possible adverse reactions/side effects.   Please note  You were cared for by a hospitalist during your hospital stay. If you have any questions about your discharge medications or the care you received while you were in the hospital after you are discharged, you can call the unit and asked to speak with the hospitalist on call if the hospitalist that took care of you is not available. Once you are discharged, your primary care physician will handle any further medical issues. Please note  that NO REFILLS for any discharge medications will be authorized once you are discharged, as it is imperative that you return to your primary care physician (or establish a relationship with a primary care physician if you do not have one) for your aftercare needs so that they can reassess your need for medications and monitor your lab values.     Today  Chief Complaint  Patient presents with  . Aphasia    Patient is back to baseline, husband and brother at bedside.  Wants to go home.  Refusing SNIF ROS:  CONSTITUTIONAL: Denies fevers, chills. Denies any fatigue, weakness.  EYES: Denies blurry vision, double vision, eye pain. EARS, NOSE, THROAT: Denies tinnitus, ear pain, hearing loss. RESPIRATORY: Denies cough, wheeze, shortness of breath.  CARDIOVASCULAR: Denies chest pain, palpitations, edema.  GASTROINTESTINAL: Denies nausea, vomiting, diarrhea, abdominal pain. Denies bright red blood per rectum. GENITOURINARY: Denies dysuria, hematuria. ENDOCRINE: Denies nocturia or thyroid problems. HEMATOLOGIC AND LYMPHATIC: Denies easy bruising or bleeding. SKIN: Denies  rash or lesion. MUSCULOSKELETAL: Denies pain in neck, back, shoulder, knees, hips or arthritic symptoms.  NEUROLOGIC: Chronic  left-sided weakness denies paralysis, paresthesias.  PSYCHIATRIC: Denies anxiety or depressive symptoms.   VITAL SIGNS:  Blood pressure (!) 122/58, pulse (!) 102, temperature 97.8 F (36.6 C), temperature source Oral, resp. rate (!) 24, height 5\' 1"  (1.549 m), weight 100.2 kg (221 lb), SpO2 95 %.  I/O:    Intake/Output Summary (Last 24 hours) at 01/05/2017 1311 Last data filed at 01/05/2017 1029 Gross per 24 hour  Intake 1730 ml  Output 300 ml  Net 1430 ml    PHYSICAL EXAMINATION:  GENERAL:  71 y.o.-year-old patient lying in the bed with no acute distress.  EYES: Pupils equal, round, reactive to light and accommodation. No scleral icterus. Extraocular muscles intact.  HEENT: Head  atraumatic, normocephalic. Oropharynx and nasopharynx clear.  NECK:  Supple, no jugular venous distention. No thyroid enlargement, no tenderness.  LUNGS: Normal breath sounds bilaterally, no wheezing, rales,rhonchi or crepitation. No use of accessory muscles of respiration.  CARDIOVASCULAR: S1, S2 normal. No murmurs, rubs, or gallops.  ABDOMEN: Soft, non-tender, non-distended. Bowel sounds present. No organomegaly or mass.  EXTREMITIES: No pedal edema, cyanosis, or clubbing.  NEUROLOGIC: Cranial nerves II through XII are intact. Muscle strength 5/5 in all extremities but 3 out of 5 in left extremities.  Sensation intact. Gait not checked.  PSYCHIATRIC: The patient is alert and oriented x 3.  SKIN: No obvious rash, lesion, or ulcer.   DATA REVIEW:   CBC Recent Labs  Lab 01/02/17 0236  WBC 7.4  HGB 14.9  HCT 43.3  PLT 233    Chemistries  Recent Labs  Lab 01/01/17 1339 01/02/17 0236  NA 140 139  K 3.5 3.2*  CL 105 103  CO2 23 25  GLUCOSE 108* 88  BUN 13 15  CREATININE 0.68 0.87  CALCIUM 9.5 9.1  AST 28  --   ALT 20  --   ALKPHOS 83  --   BILITOT 1.2  --     Cardiac Enzymes Recent Labs  Lab 01/02/17 0742  TROPONINI <0.03    Microbiology Results  Results for orders placed or performed during the hospital encounter of 01/01/17  Culture, blood (routine x 2)     Status: None (Preliminary result)   Collection Time: 01/01/17  4:13 PM  Result Value Ref Range Status   Specimen Description BLOOD RIGHT ANTECUBITAL  Final   Special Requests   Final    BOTTLES DRAWN AEROBIC AND ANAEROBIC Blood Culture results may not be optimal due to an inadequate volume of blood received in culture bottles   Culture NO GROWTH 4 DAYS  Final   Report Status PENDING  Incomplete  Culture, blood (routine x 2)     Status: None (Preliminary result)   Collection Time: 01/01/17  4:21 PM  Result Value Ref Range Status   Specimen Description BLOOD LEFT ANTECUBITAL  Final   Special Requests    Final    BOTTLES DRAWN AEROBIC AND ANAEROBIC Blood Culture results may not be optimal due to an inadequate volume of blood received in culture bottles   Culture NO GROWTH 4 DAYS  Final   Report Status PENDING  Incomplete    RADIOLOGY:  Dg Chest 2 View  Result Date: 01/01/2017 CLINICAL DATA:  Delays for week, slurred speech, lethargy, and heart flashes. Similar symptoms in the past when the patient had a bladder infection. EXAM: CHEST  2 VIEW COMPARISON:  PA  and lateral chest x-ray of July 30, 2010 FINDINGS: The lungs are mildly hypoinflated. There is patchy increased density in the retrocardiac region likely on the left. The cardiac silhouette is enlarged. The pulmonary vascularity is not clearly engorged. There is no pleural effusion. The observed bony thorax is unremarkable. IMPRESSION: Probable left lower lobe atelectasis or pneumonia.  No overt CHF. Electronically Signed   By: David  Martinique M.D.   On: 01/01/2017 14:35   Ct Head Wo Contrast  Result Date: 01/01/2017 CLINICAL DATA:  Slurred speech and lethargy. EXAM: CT HEAD WITHOUT CONTRAST TECHNIQUE: Contiguous axial images were obtained from the base of the skull through the vertex without intravenous contrast. COMPARISON:  08/04/2013 FINDINGS: Brain: There is no evidence for acute hemorrhage, hydrocephalus, mass lesion, or abnormal extra-axial fluid collection. No definite CT evidence for acute infarction. Vascular: Vascular coils identified in the region of the right internal carotid artery, stable. Internal carotid stent device noted on bone windows. Skull: No evidence for fracture. No worrisome lytic or sclerotic lesion. Sinuses/Orbits: Polypoid mucosal disease noted right maxillary sinus. Visualized portions of the globes and intraorbital fat are unremarkable. Other: None. IMPRESSION: 1. No acute intracranial abnormality. 2. Stable appearance right internal carotid stent with evidence of aneurysm coils. Electronically Signed   By: Misty Stanley M.D.   On: 01/01/2017 14:29   Ct Angio Chest Pe W Or Wo Contrast  Result Date: 01/01/2017 CLINICAL DATA:  Productive cough and weakness. EXAM: CT ANGIOGRAPHY CHEST WITH CONTRAST TECHNIQUE: Multidetector CT imaging of the chest was performed using the standard protocol during bolus administration of intravenous contrast. Multiplanar CT image reconstructions and MIPs were obtained to evaluate the vascular anatomy. CONTRAST:  47mL ISOVUE-370 IOPAMIDOL (ISOVUE-370) INJECTION 76% COMPARISON:  Chest CT 02/02/2010 FINDINGS: Cardiovascular: The heart is normal in size for age. No pericardial effusion. There is tortuosity of the thoracic aorta but no aneurysm or dissection. Remarkably little atherosclerotic calcifications for age. Scattered coronary artery calcifications. The pulmonary arterial tree is fairly well opacified. No definite filling defects to suggest pulmonary embolism. Mediastinum/Nodes: No mediastinal or hilar mass or lymphadenopathy. Esophagus is grossly normal. Lungs/Pleura: No acute pulmonary findings. Dependent subpleural atelectasis but no infiltrates, edema or effusions. No worrisome pulmonary lesions or pulmonary nodules. Upper Abdomen: No significant upper abdominal findings. Surgical changes from a hiatal hernia repair. Hepatic cysts are noted. Musculoskeletal: No breast masses, supraclavicular or axillary lymphadenopathy. The bony structures are unremarkable. Review of the MIP images confirms the above findings. IMPRESSION: 1. No CT findings for pulmonary embolism. 2. Normal thoracic aorta. 3. No acute pulmonary findings or worrisome pulmonary lesions. Aortic Atherosclerosis (ICD10-I70.0). Electronically Signed   By: Marijo Sanes M.D.   On: 01/01/2017 17:13   Mr Brain Wo Contrast  Result Date: 01/01/2017 CLINICAL DATA:  Focal neuro deficit for over 6 hours. Slurred speech starting at 2 a.m. UTI. EXAM: MRI HEAD WITHOUT CONTRAST TECHNIQUE: Multiplanar, multiecho pulse sequences of  the brain and surrounding structures were obtained without intravenous contrast. COMPARISON:  Head CT from earlier today. FINDINGS: Brain: No acute infarction, hemorrhage, hydrocephalus, extra-axial collection or mass lesion. Age normal brain volume. Minor FLAIR hyperintensity in the cerebral white matter, age congruent. Vascular: Loss of left sigmoid and upper IJ flow void is likely slow flow rather than occlusion. This area was not hyperdense on preceding head CT. There is subtle artifact in the right ICA. The patient is status post stent assisted aneurysm coil embolization. Skull and upper cervical spine: Negative for marrow lesion. Sinuses/Orbits: Mucous  retention cysts in the right maxillary sinus. IMPRESSION: Negative exam.  No explanation for symptoms. Electronically Signed   By: Monte Fantasia M.D.   On: 01/01/2017 19:25    EKG:   Orders placed or performed during the hospital encounter of 01/01/17  . ED EKG  . ED EKG  . EKG 12-Lead  . EKG 12-Lead  . EKG 12-Lead  . EKG 12-Lead      Management plans discussed with the patient, family and they are in agreement.  CODE STATUS:     Code Status Orders  (From admission, onward)        Start     Ordered   01/01/17 1734  Full code  Continuous     01/01/17 1733    Code Status History    Date Active Date Inactive Code Status Order ID Comments User Context   10/19/2014 12:01 10/23/2014 17:46 Full Code [id number]  Earnestine Leys, MD Inpatient    Advance Directive Documentation     Most Recent Value  Type of Advance Directive  Living will  Pre-existing out of facility DNR order (yellow form or pink MOST form)  No data  "MOST" Form in Place?  No data      TOTAL TIME TAKING CARE OF THIS PATIENT: 45  minutes.   Note: This dictation was prepared with Dragon dictation along with smaller phrase technology. Any transcriptional errors that result from this process are unintentional.   @[facility] @  on 01/05/2017 at 1:11 PM  Between 7am to  6pm - Pager - [phone]  After 6pm go to [email or web address] - password [email or web address] [facility]  [facility]  Office  [phone]  CC: Primary care physician; Jerrol Banana., MD

## 2017-01-05 NOTE — Progress Notes (Signed)
[facility] MD Progress Note  01/05/2017 10:40 AM Gail Hardy  MRN:  [id number] Subjective: Patient is awake and alert this morning.  She is oriented to time, place and person.  Endorses having had a good night and being able to sleep well.  Last night I got her back on the original dose of temazepam which is 30 mg daily.  The night before I had her on 15 mg as needed and she had complained of not being able to sleep well which is expected.  However skipping temazepam for a day made her much more awake and alert.  Medical team continues to work her up for possible pneumonia. She denies any depressive symptoms or depressive cognitions.  Denies any suicidal thoughts or thoughts about dying.  Does not seem to be slow, lethargic or confused. Principal Problem: <principal problem not specified> Diagnosis:   Patient Active Problem List   Diagnosis Date Noted  . Confusion [R41.0] 01/01/2017  . Pneumonia [J18.9] 01/01/2017  . History of calcium pyrophosphate deposition disease (CPPD) [Z87.39] 10/08/2016  . Pseudogout involving multiple joints [M11.89] 10/08/2016  . Osteoarthritis [M15.0] 10/08/2016  . Lumbar facet hypertrophy [M47.896] 10/08/2016  . Spondylosis of lumbar spine [M47.816] 10/08/2016  . Chronic low back pain (Bilateral) [M54.5, G89.29] 10/08/2016  . Chronic shoulder pain (Left) [M25.512, G89.29] 10/08/2016  . Long term prescription benzodiazepine use [Z79.899] 10/08/2016  . Long term prescription opiate use [Z79.891] 10/08/2016  . Long term (current) use of opiate analgesic [Z79.891] 10/08/2016  . Opiate use [F11.90] 10/08/2016  . Chronic knee pain (Left) [U13.244, G89.29] 10/08/2016  . Osteoarthritis of knee (Left) [M17.12] 10/08/2016  . Chronic hip pain (Left) [W10.272, G89.29] 10/08/2016  . Osteoarthritis of hip (Left) [M16.12] 10/08/2016  . Chronic pain syndrome [G89.4] 10/08/2016  . Disturbance of skin sensation [R20.9] 10/08/2016  . Cerebrovascular accident (CVA) ([place]) [I63.9]  03/15/2015  . Pressure ulcer [L89.90] 10/22/2014  . Stress fracture of hip with delayed healing [M84.359G] 10/19/2014  . Allergic rhinitis [J30.9] 09/20/2014  . Anxiety disorder [F41.9] 09/20/2014  . Absolute anemia [D64.9] 09/20/2014  . Bipolar 1 disorder, depressed ([place]) [F31.9] 09/20/2014  . Body mass index of 60 or higher [IMO0002] 09/20/2014  . Anemia due to blood loss [D50.0] 09/20/2014  . Aneurysm, cerebral [I67.1] 09/20/2014  . Chronic headache [R51] 09/20/2014  . Claustrophobia [F40.240] 09/20/2014  . Cerebral vascular accident ([place]) [I63.9] 09/20/2014  . Acute confusion due to medical condition [F05] 09/20/2014  . Clinical depression [F32.9] 09/20/2014  . Abnormal liver enzymes [R74.8] 09/20/2014  . 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 Diaphragmatic hernia [K44.9] 09/20/2014  . Gastric erosion [K25.9] 09/20/2014  . Enterogastritis [K52.9] 09/20/2014  . 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 Hypercholesteremia [E78.00] 09/20/2014  . Cannot sleep [G47.00] 09/20/2014  . Malaise and fatigue [R53.81, R53.83] 09/20/2014  . Bad memory [R41.3] 09/20/2014  . Altered blood in stool [IMO0001] 09/20/2014  . Candida glabrata infection [B37.9] 09/20/2014  . Decreased motor strength [R53.1] 09/20/2014  . Adiposity [E66.9] 09/20/2014  . Excess weight [E66.3] 09/20/2014  . Paralysis agitans ([place]) [G20] 09/20/2014  . Breakdown [IMO0001] 09/20/2014  . PE (pulmonary embolism) [I26.99] 09/20/2014  . Hypercholesterolemia without hypertriglyceridemia [E78.00] 09/20/2014  . Restless leg [G25.81] 09/20/2014  . Neuralgia neuritis, sciatic nerve [M54.30] 09/20/2014  . Gastric ulcer [K25.9] 09/20/2014  . Fast heart beat [R00.0] 09/20/2014  . Temporary cerebral vascular dysfunction [G93.9] 09/20/2014  . Has a tremor [R25.1] 09/20/2014  . Adynamia [G72.3] 09/20/2014  . Classical migraine with intractable migraine [  Z36.644] 08/10/2013  . Dysphagia, unspecified(787.20)  [R13.10] 08/05/2012  . Paraesophageal hernia [K44.9] 08/04/2012   Total Time spent with patient: 30 minutes  Past Psychiatric History: Has a history of MDD and anxiety in the past.  Her husband endorses that she has been admitted for MDD in the past.    Past Medical History:  Past Medical History:  Diagnosis Date  . Anemia   . Aneurysm ([place]) 2011  . Blood transfusion without reported diagnosis 2013,2014   x 2  . Cancer ([place])   . Depression   . Family history of adverse reaction to anesthesia    daughter nausea and vomiting  . Glaucoma   . Hernia   . Hyperlipidemia   . Pulmonary emboli ([place]) 2011  . Stroke [facility]) 2010    Past Surgical History:  Procedure Laterality Date  . ABDOMINAL HYSTERECTOMY    . BREAST EXCISIONAL BIOPSY Right    pt states she had "lumps removed from breast as a teen"  . CEREBRAL ANEURYSM REPAIR  2011   stent and coil insertion  . CHOLECYSTECTOMY    . COLONOSCOPY  2013   [facility]  . HERNIA REPAIR  2014   lap paraesophageal repair  . INTRAMEDULLARY (IM) NAIL INTERTROCHANTERIC Left 10/19/2014   Procedure: INTRAMEDULLARY (IM) NAIL INTERTROCHANTRIC;  Surgeon: Earnestine Leys, MD;  Location: [facility] ORS;  Service: Orthopedics;  Laterality: Left;  . UPPER GI ENDOSCOPY  2013, 2014   [place]   Family History:  Family History  Problem Relation Age of Onset  . Emphysema Father   . COPD Mother   . Heart disease Mother   . Heart disease Brother   . Dementia Brother   . Liver disease Brother   . Heart disease Brother    Family Psychiatric  History:  Social History:  Social History   Substance and Sexual Activity  Alcohol Use No     Social History   Substance and Sexual Activity  Drug Use No    Social History   Socioeconomic History  . Marital status: Married    Spouse name: [name]  . Number of children: 2  . Years of education: None  . Highest education level: None  Social Needs  . Financial resource strain: None  . Food insecurity - worry:  None  . Food insecurity - inability: None  . Transportation needs - medical: None  . Transportation needs - non-medical: None  Occupational History  . None  Tobacco Use  . Smoking status: Former Smoker    Years: 10.00    Types: Cigarettes    Last attempt to quit: 02/10/1989    Years since quitting: 27.9  . Smokeless tobacco: Never Used  Substance and Sexual Activity  . Alcohol use: No  . Drug use: No  . Sexual activity: No  Other Topics Concern  . None  Social History Narrative  . None   Additional Social History:                         Sleep: Good  Appetite:  Good  Current Medications: Current Facility-Administered Medications  Medication Dose Route Frequency Provider Last Rate Last Dose  . 0.9 %  sodium chloride infusion  250 mL Intravenous PRN Dustin Flock, MD      . 0.9 %  sodium chloride infusion   Intravenous Continuous Gouru, Aruna, MD 100 mL/hr at 01/05/17 0451    . acetaminophen (TYLENOL) tablet 650 mg  650 mg Oral Q6H PRN  Dustin Flock, MD   650 mg at 01/04/17 0532   Or  . acetaminophen (TYLENOL) suppository 650 mg  650 mg Rectal Q6H PRN Dustin Flock, MD      . amphetamine-dextroamphetamine (ADDERALL) tablet 10 mg  10 mg Oral Q breakfast Dustin Flock, MD   10 mg at 01/05/17 8841  . ARIPiprazole (ABILIFY) tablet 30 mg  30 mg Oral Daily Dustin Flock, MD   30 mg at 01/05/17 1019  . aspirin EC tablet 81 mg  81 mg Oral Daily Salary, Montell D, MD   81 mg at 01/05/17 1019  . buPROPion (WELLBUTRIN SR) 12 hr tablet 150 mg  150 mg Oral Daily Dustin Flock, MD   150 mg at 01/04/17 0921  . capsaicin (ZOSTRIX) 0.025 % cream   Topical TID PRN Dustin Flock, MD      . clonazePAM Bobbye [place]) tablet 0.25 mg  0.25 mg Oral Q8H PRN Gouru, Aruna, MD      . diltiazem (CARDIZEM) tablet 60 mg  60 mg Oral Q8H Dustin Flock, MD   60 mg at 01/05/17 0451  . doxycycline (VIBRA-TABS) tablet 100 mg  100 mg Oral Q12H Gouru, Aruna, MD   100 mg at 01/05/17 1019  .  enoxaparin (LOVENOX) injection 40 mg  40 mg Subcutaneous Q12H Dustin Flock, MD   40 mg at 01/05/17 1019  . gabapentin (NEURONTIN) capsule 400 mg  400 mg Oral TID Dustin Flock, MD   400 mg at 01/05/17 1019  . latanoprost (XALATAN) 0.005 % ophthalmic solution 1 drop  1 drop Both Eyes QHS Dustin Flock, MD   1 drop at 01/04/17 2105  . nitroGLYCERIN (NITROSTAT) SL tablet 0.4 mg  0.4 mg Sublingual Q5 min PRN Salary, Montell D, MD      . ondansetron (ZOFRAN) tablet 4 mg  4 mg Oral Q6H PRN Dustin Flock, MD       Or  . ondansetron (ZOFRAN) injection 4 mg  4 mg Intravenous Q6H PRN Dustin Flock, MD   4 mg at 01/03/17 1006  . promethazine (PHENERGAN) tablet 25 mg  25 mg Oral Q6H PRN Dustin Flock, MD   25 mg at 01/03/17 1452  . sodium chloride flush (NS) 0.9 % injection 3 mL  3 mL Intravenous Q12H Dustin Flock, MD   3 mL at 01/04/17 2105  . sodium chloride flush (NS) 0.9 % injection 3 mL  3 mL Intravenous PRN Dustin Flock, MD      . temazepam (RESTORIL) capsule 30 mg  30 mg Oral QHS Ramond Dial, MD   30 mg at 01/04/17 2104    Lab Results: No results found for this or any previous visit (from the past 48 hour(s)).  Blood Alcohol level:  No results found for: [facility]  Metabolic Disorder Labs: Lab Results  Component Value Date   HGBA1C 5.3 07/31/2015   No results found for: PROLACTIN Lab Results  Component Value Date   CHOL 283 (H) 01/03/2017   TRIG 229 (H) 01/03/2017   HDL 58 01/03/2017   CHOLHDL 4.9 01/03/2017   VLDL 46 (H) 01/03/2017   LDLCALC 179 (H) 01/03/2017   LDLCALC 161 (H) 01/02/2017    Physical Findings: AIMS:  , ,  ,  ,    CIWA:    COWS:     Musculoskeletal: Strength & Muscle Tone: Not tested Gait & Station: Not tested Patient leans: N/A  Psychiatric Specialty Exam: Physical Exam  ROS  Blood pressure (!) 122/58, pulse 99, temperature 97.8 F (36.6 C),  temperature source Oral, resp. rate (!) 24, height 5\' 1"  (1.549 m), weight 221 lb (100.2 kg),  SpO2 93 %.Body mass index is 41.76 kg/m.  General Appearance: Well Groomed  Eye Contact:  Good  Speech:  Normal Rate  Volume:  Normal  Mood:  Euthymic  Affect:  Full Range  Thought Process:  Coherent  Orientation:  Full (Time, Place, and Person)  Thought Content:  Logical  Suicidal Thoughts:  No  Homicidal Thoughts:  No  Memory:  NA  Judgement:  Intact  Insight:  Fair  Psychomotor Activity:  Normal  Assets:  Communication Skills Desire for Improvement  ADL's:  Intact  Cognition:  WNL  Sleep:     Treatment Plan Summary: Plan To be detailed below Patient does not meet criteria for psychiatric inpatient admission.  71 year old lady with a previous history of MDD and possibly bipolar disorder admitted now for a history of lethargy and worsening pneumonia.  Has had brain imaging which is ruled out stroke or any other cardiovascular events.  She is on multiple psychotropic medications and Neurontin for neuropathic pain.  Psychiatry initially consulted because she seemed lethargic and was being worked up for stroke.  Primary team concerned about possible hypoactive delirium due to ongoing infection.  We held temazepam for 1 night in addition to withholding her Celexa.  Restarted temazepam yesterday and she seems to be doing fine this morning.  Will restart Celexa late afternoon today.  Currently alert, awake and oriented to time place person.  No symptoms suggestive of hypoactive or hyperactive delirium currently.   Ramond Dial, MD 01/05/2017, 10:40 AM

## 2017-01-05 NOTE — Progress Notes (Signed)
Discharge instructions given. IV out, tele off. Husband at bedside, both verbalized understanding with no further questions. Pt transported via wheelchair.

## 2017-01-05 NOTE — Discharge Instructions (Signed)
Follow-up with primary care physician in 1 week Follow-up with psychiatry in 1 week

## 2017-01-06 ENCOUNTER — Telehealth: Payer: Self-pay

## 2017-01-06 LAB — CULTURE, BLOOD (ROUTINE X 2)
CULTURE: NO GROWTH
Culture: NO GROWTH

## 2017-01-06 NOTE — Telephone Encounter (Signed)
Transition Care Management Follow-Up Telephone Call   Date discharged and where:  How have you been since you were released from the hospital? Not well, pt states she is weak and has had diarrhea since yesterday. Legs are swollen and painful (level 10) which is making it hard to move around. Denies dizziness, nausea, vomiting, confusion, slurred speech and SOB.    Any patient concerns? Diarrhea and weakness.  Items Reviewed:   Meds: verified  Allergies: verified  Dietary Changes Reviewed: None  Functional Questionnaire:  Independent-I Dependent-D  ADLs:   Dressing- D    Eating- I   Maintaining continence- I   Transferring- Needs assistance   Transportation- D   Meal Prep- D   Managing Meds- I  Confirmed importance and Date/Time of follow-up visits scheduled: 01/21/17 @ 11:00 AM (soonest apt offered per husband)   Confirmed with patient if condition worsens to call PCP or go to the Emergency Dept. Patient was given office number and encouraged to call back with questions or concerns: YES

## 2017-01-21 ENCOUNTER — Ambulatory Visit: Payer: PPO | Admitting: Family Medicine

## 2017-01-25 IMAGING — NM NM BONE WHOLE BODY
2 series · 12 of 12 positions shown · non-contrast
Comparison: CT left hip dated 08/29/2014

CLINICAL DATA: Left thigh pain, worse with weight-bearing. Fall 2
months ago, started having pain status post fall.

EXAM:
NUCLEAR MEDICINE WHOLE BODY BONE SCAN
TECHNIQUE: Whole body anterior and posterior images were obtained approximately
3 hours after intravenous injection of radiopharmaceutical.
RADIOPHARMACEUTICALS:  24.1 mCi Dechnetium-11m MDP IV

[Series 1000: 3 hr wholebody · 2.40mm/px · 2 of 2 frames shown]
[frame 1/2]
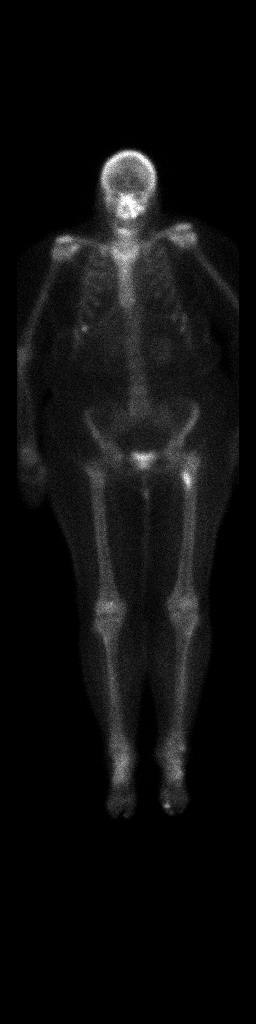
[frame 2/2]
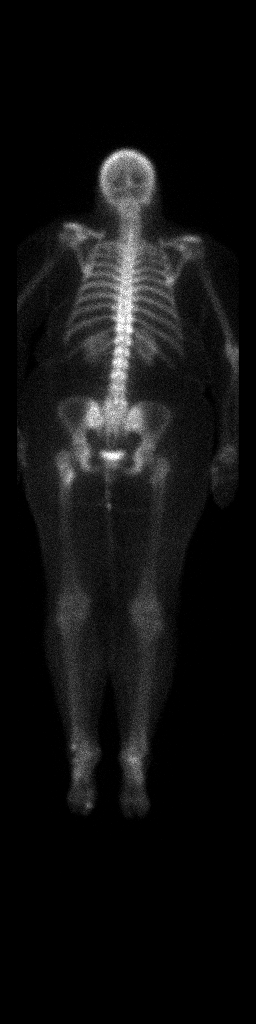

[Series 1000: statics · 2.40mm/px · 5 acquisitions, 10 frames shown]
[im 1/5]
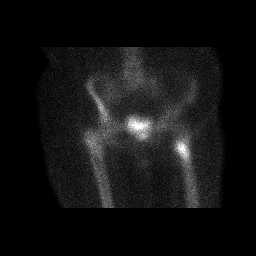
[im 1/5]
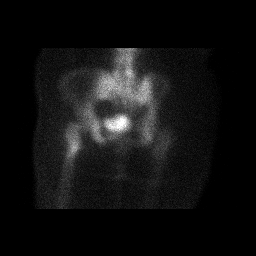
[im 2/5]
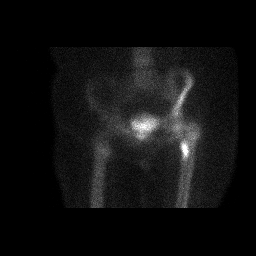
[im 2/5]
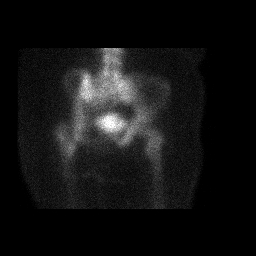
[im 3/5]
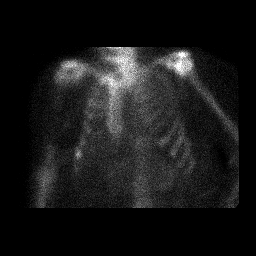
[im 3/5]
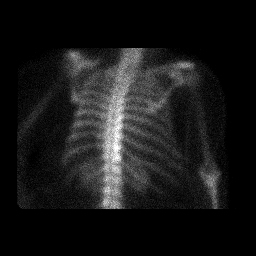
[im 4/5]
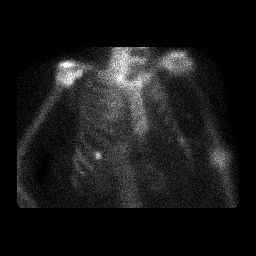
[im 4/5]
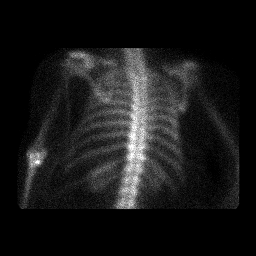
[im 5/5]
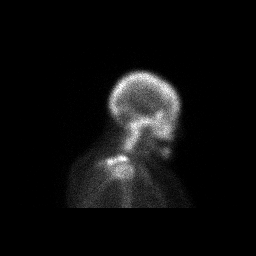
[im 5/5]
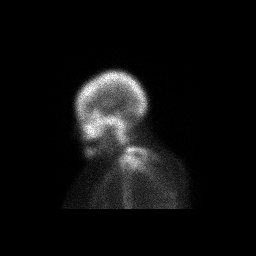

[12 of 12 positions shown; findings below may reference images not displayed]

FINDINGS: Focal radiotracer uptake along the medial aspect of the distal left
femoral neck. No corresponding abnormality on prior CT left hip.
This suggests an occult/incomplete left femoral neck fracture.

Mild radiotracer uptake along in anterior right lower rib, possibly
the 9th rib. This may also be posttraumatic.

Mild degenerative changes at the left 1st MTP joint, likely
degenerative.

No abnormal accumulation of radiotracer within the axillary or
appendicular skeleton to suggest skeletal metastases.
IMPRESSION: Focal radiotracer uptake along the medial aspect of the distal left
femoral neck, suggesting an occult/incomplete left femoral neck
fracture. Consider left hip MRI for further evaluation.

Mild radiotracer uptake along the anterior right lower 9th rib,
possibly reflecting an additional posttraumatic injury.

## 2017-01-28 DIAGNOSIS — M5416 Radiculopathy, lumbar region: Secondary | ICD-10-CM | POA: Diagnosis not present

## 2017-01-28 DIAGNOSIS — M5136 Other intervertebral disc degeneration, lumbar region: Secondary | ICD-10-CM | POA: Diagnosis not present

## 2017-01-30 ENCOUNTER — Other Ambulatory Visit: Payer: Self-pay | Admitting: Family Medicine

## 2017-01-30 NOTE — Telephone Encounter (Signed)
Do you mind refilling these for her since Dr. Darnell Level is out of the office. Please advise. Thanks.

## 2017-01-30 NOTE — Telephone Encounter (Signed)
Pt contacted office for refill request on the following medications:  1. temazepam (RESTORIL) 30 MG capsule  Last Rx: 07/25/16 written by Dr. Rosanna Randy  2. clonazePAM Bobbye [place]) 0.5 MG tablet    Last Rx: 01/05/17 Pt stated the last Rx was written by the hospital & she said Dr. Rosanna Randy wrote it for her before that.  [place]  Pt was advised that Dr. Rosanna Randy is out of the office. Pt requested another provider approve the refills.  Please advise. Thanks TNP

## 2017-01-30 NOTE — Telephone Encounter (Signed)
Ok to phone in both of these medications please.

## 2017-01-31 MED ORDER — CLONAZEPAM 0.5 MG PO TABS: 0.2500 mg | ORAL_TABLET | Freq: Three times a day (TID) | ORAL | 0 refills | Status: DC | PRN

## 2017-01-31 NOTE — Telephone Encounter (Signed)
Temazepam was filled by Dr Caryn Section already today, called in Clonazepam refill-Corynne Scibilia V Vestal Crandall, RMA

## 2017-01-31 NOTE — Telephone Encounter (Signed)
Please review for Dr Rosario Jacks, RMA

## 2017-02-09 IMAGING — CR DG CHEST 1V
1 series · 1 of 1 positions shown · non-contrast
Comparison: 01/28/2011

CLINICAL DATA: Weakness and shortness of breath.  Hypoxia.

EXAM:
CHEST  1 VIEW

[ap]
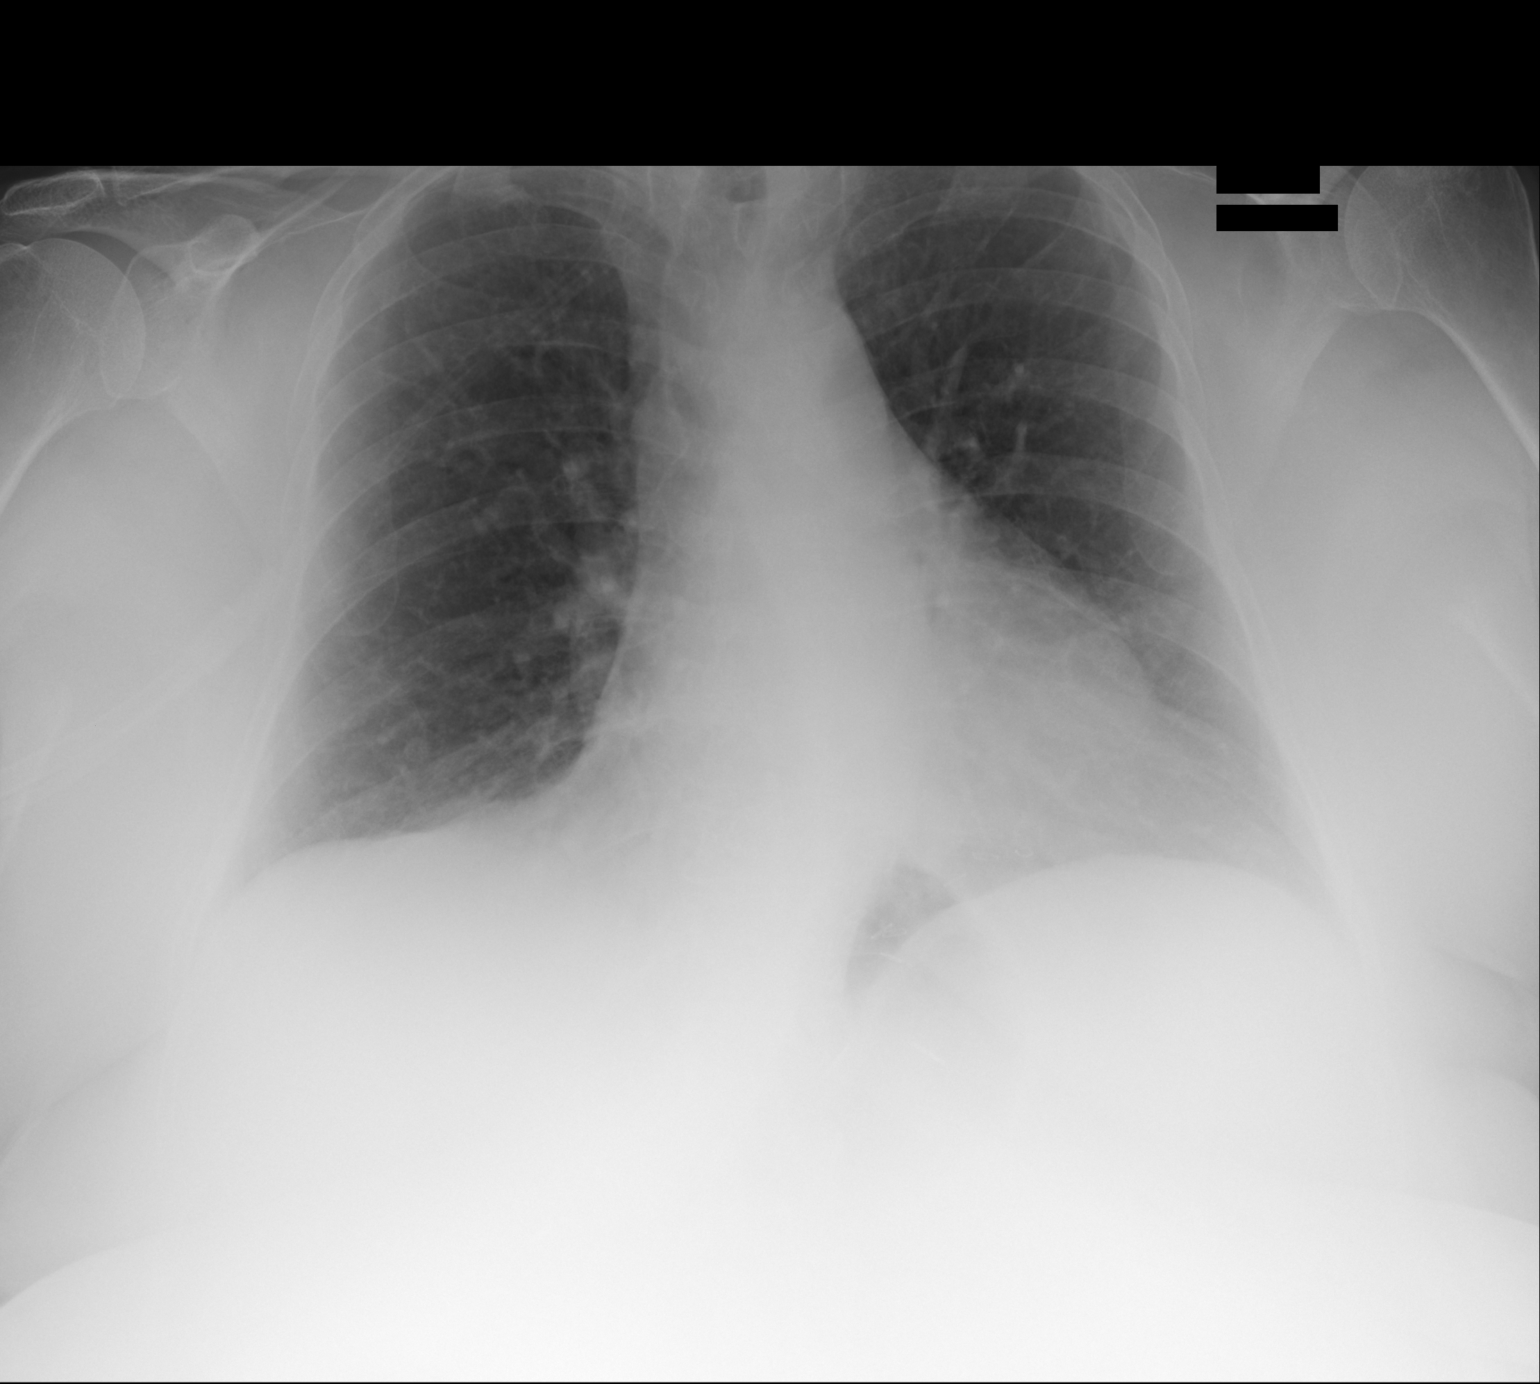

[1 of 1 positions shown; findings below may reference images not displayed]

FINDINGS: Heart size and pulmonary vascularity are normal. The lungs are
clear. Surgical clips at the gastroesophageal junction, probably
from hiatal hernia repair.

No osseous abnormality.
IMPRESSION: Normal chest.

## 2017-02-12 ENCOUNTER — Telehealth: Payer: Self-pay | Admitting: Family Medicine

## 2017-02-12 NOTE — Telephone Encounter (Signed)
Patient wants to know which medications were discontinued

## 2017-02-13 NOTE — Telephone Encounter (Signed)
Spoke with patient and it does not look like medication changes were made but decrease of Klonopin during hospital stay in November. Patient missed hospital f/u in December due to snow. Rescheduled this. Advised patient we need to follow up and re check on UTI and pneumonia she had-Talissa Apple V Deondray Ospina, RMA

## 2017-02-15 DIAGNOSIS — M5126 Other intervertebral disc displacement, lumbar region: Secondary | ICD-10-CM | POA: Diagnosis not present

## 2017-02-19 ENCOUNTER — Other Ambulatory Visit: Payer: Self-pay

## 2017-02-19 ENCOUNTER — Ambulatory Visit
Admission: RE | Admit: 2017-02-19 | Discharge: 2017-02-19 | Disposition: A | Discharge status: Home / Self Care | Payer: PPO | Source: Ambulatory Visit | Attending: Family Medicine | Admitting: Family Medicine

## 2017-02-19 ENCOUNTER — Ambulatory Visit (INDEPENDENT_AMBULATORY_CARE_PROVIDER_SITE_OTHER): Payer: PPO | Admitting: Family Medicine

## 2017-02-19 VITALS — BP 122/80 | HR 92 | Temp 97.7°F | Wt 224.0 lb

## 2017-02-19 DIAGNOSIS — G20 Parkinson's disease: Secondary | ICD-10-CM | POA: Diagnosis not present

## 2017-02-19 DIAGNOSIS — J189 Pneumonia, unspecified organism: Secondary | ICD-10-CM

## 2017-02-19 DIAGNOSIS — E663 Overweight: Secondary | ICD-10-CM | POA: Diagnosis not present

## 2017-02-19 DIAGNOSIS — J181 Lobar pneumonia, unspecified organism: Secondary | ICD-10-CM | POA: Insufficient documentation

## 2017-02-19 DIAGNOSIS — M150 Primary generalized (osteo)arthritis: Secondary | ICD-10-CM

## 2017-02-19 DIAGNOSIS — Z6841 Body Mass Index (BMI) 40.0 and over, adult: Secondary | ICD-10-CM | POA: Diagnosis not present

## 2017-02-19 DIAGNOSIS — M159 Polyosteoarthritis, unspecified: Secondary | ICD-10-CM

## 2017-02-19 NOTE — Progress Notes (Signed)
Gail Hardy  MRN: [id number] DOB: [birth year]  Subjective:  HPI   Patient is a 72 year old that is here to follow up on the pneumonia she was hospitalized for in November.  The patient was admitted to [facility] on 01/01/17 and discharged on 01/05/17.  She presented with slurred speech/dysphagia.  She was discharged with diagnosis of Community acquired pneumonia, slurred speech, delirium (resolved)and dysarthria (resolved) Patient had negative CT and MRI of the brain.   Neurology recommended decreasing sedation medication, discontinue Morphine and decrease dose of Klonopin.  Patient and her husband report that her breathing is better.  She is not coughing and her speech is back to normal.    They report that her only issue now is her back/buttock pain.  She is scheduled for surgery on 03/24/16.  Patient Active Problem List   Diagnosis Date Noted  . Confusion 01/01/2017  . Pneumonia 01/01/2017  . History of calcium pyrophosphate deposition disease (CPPD) 10/08/2016  . Pseudogout involving multiple joints 10/08/2016  . Osteoarthritis 10/08/2016  . Lumbar facet hypertrophy 10/08/2016  . Spondylosis of lumbar spine 10/08/2016  . Chronic low back pain (Bilateral) 10/08/2016  . Chronic shoulder pain (Left) 10/08/2016  . Long term prescription benzodiazepine use 10/08/2016  . Long term prescription opiate use 10/08/2016  . Long term (current) use of opiate analgesic 10/08/2016  . Opiate use 10/08/2016  . Chronic knee pain (Left) 10/08/2016  . Osteoarthritis of knee (Left) 10/08/2016  . Chronic hip pain (Left) 10/08/2016  . Osteoarthritis of hip (Left) 10/08/2016  . Chronic pain syndrome 10/08/2016  . Disturbance of skin sensation 10/08/2016  . Cerebrovascular accident (CVA) ([place]) 03/15/2015  . Pressure ulcer 10/22/2014  . Stress fracture of hip with delayed healing 10/19/2014  . Allergic rhinitis 09/20/2014  . Anxiety disorder 09/20/2014  . Absolute anemia 09/20/2014  . Bipolar 1  disorder, depressed ([place]) 09/20/2014  . Body mass index of 60 or higher 09/20/2014  . Anemia due to blood loss 09/20/2014  . Aneurysm, cerebral 09/20/2014  . Chronic headache 09/20/2014  . Claustrophobia 09/20/2014  . Cerebral vascular accident ([place]) 09/20/2014  . Acute confusion due to medical condition 09/20/2014  . Clinical depression 09/20/2014  . Abnormal liver enzymes 09/20/2014  . 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 Diaphragmatic hernia 09/20/2014  . Gastric erosion 09/20/2014  . Enterogastritis 09/20/2014  . 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 Hypercholesteremia 09/20/2014  . Cannot sleep 09/20/2014  . Malaise and fatigue 09/20/2014  . Bad memory 09/20/2014  . Altered blood in stool 09/20/2014  . Candida glabrata infection 09/20/2014  . Decreased motor strength 09/20/2014  . Adiposity 09/20/2014  . Excess weight 09/20/2014  . Paralysis agitans ([place]) 09/20/2014  . Breakdown 09/20/2014  . PE (pulmonary embolism) 09/20/2014  . Hypercholesterolemia without hypertriglyceridemia 09/20/2014  . Restless leg 09/20/2014  . Neuralgia neuritis, sciatic nerve 09/20/2014  . Gastric ulcer 09/20/2014  . Fast heart beat 09/20/2014  . Temporary cerebral vascular dysfunction 09/20/2014  . Has a tremor 09/20/2014  . Adynamia 09/20/2014  . Classical migraine with intractable migraine 08/10/2013  . Dysphagia, unspecified(787.20) 08/05/2012  . Paraesophageal hernia 08/04/2012    Past Medical History:  Diagnosis Date  . Anemia   . Aneurysm ([place]) 2011  . Blood transfusion without reported diagnosis 2013,2014   x 2  . Cancer ([place])   . Depression   . Family history of adverse reaction to anesthesia    daughter nausea and vomiting  . Glaucoma   . Hernia   .  Hyperlipidemia   . Pulmonary emboli ([place]) 2011  . Stroke [facility]) 2010    Social History   Socioeconomic History  . Marital status: Married    Spouse name: [name]  . Number of children: 2  . Years of education:  Not on file  . Highest education level: Not on file  Social Needs  . Financial resource strain: Not on file  . Food insecurity - worry: Not on file  . Food insecurity - inability: Not on file  . Transportation needs - medical: Not on file  . Transportation needs - non-medical: Not on file  Occupational History  . Not on file  Tobacco Use  . Smoking status: Former Smoker    Years: 10.00    Types: Cigarettes    Last attempt to quit: 02/10/1989    Years since quitting: 28.0  . Smokeless tobacco: Never Used  Substance and Sexual Activity  . Alcohol use: No  . Drug use: No  . Sexual activity: No  Other Topics Concern  . Not on file  Social History Narrative  . Not on file    Outpatient Encounter Medications as of 02/19/2017  Medication Sig  . ARIPiprazole (ABILIFY) 30 MG tablet TAKE 1 TABLET BY MOUTH ONCE DAILY  . buPROPion (WELLBUTRIN XL) 150 MG 24 hr tablet Take 1 tablet (150 mg total) by mouth daily.  . clonazePAM (KLONOPIN) 0.5 MG tablet Take 0.5 tablets (0.25 mg total) by mouth every 8 (eight) hours as needed (anxiety).  Marland Kitchen latanoprost (XALATAN) 0.005 % ophthalmic solution Place 1 drop into both eyes at bedtime.   . temazepam (RESTORIL) 30 MG capsule TAKE 1 CAPSULE BY MOUTH ONCE DAILY  . acetaminophen-codeine (TYLENOL #3) 300-30 MG tablet Take 1 tablet by mouth every 8 (eight) hours as needed for moderate pain. (Patient not taking: Reported on 01/06/2017)  . amphetamine-dextroamphetamine (ADDERALL) 10 MG tablet TAKE 1 TABLET BY MOUTH TWICE (2) DAILY (Patient not taking: Reported on 01/06/2017)  . capsaicin (ZOSTRIX) 0.025 % cream Apply topically 3 (three) times daily. (Patient not taking: Reported on 01/06/2017)  . citalopram (CELEXA) 40 MG tablet Take 1 tablet (40 mg total) by mouth daily. (Patient not taking: Reported on 01/06/2017)  . diltiazem (CARDIZEM) 60 MG tablet Take 1 tablet (60 mg total) by mouth every 8 (eight) hours. (Patient not taking: Reported on 01/06/2017)  .  gabapentin (NEURONTIN) 400 MG capsule Take 1 capsule (400 mg total) by mouth 3 (three) times daily. (Patient not taking: Reported on 01/06/2017)  . promethazine (PHENERGAN) 25 MG tablet TAKE 1/2 TO 1 TABLET BY MOUTH EVERY 6 HOURS AS NEEDED (Patient not taking: Reported on 01/06/2017)   No facility-administered encounter medications on file as of 02/19/2017.     Allergies  Allergen Reactions  . Diazepam     Other reaction(s): Feeling agitated (finding)    Review of Systems  Constitutional: Positive for malaise/fatigue. Negative for fever.  HENT: Negative.   Eyes: Negative.   Respiratory: Negative for cough, hemoptysis, sputum production, shortness of breath and wheezing.   Cardiovascular: Positive for leg swelling (ankles). Negative for chest pain, palpitations and orthopnea.  Gastrointestinal: Negative.   Genitourinary: Negative.   Skin: Negative.   Neurological: Positive for tremors and weakness.  Endo/Heme/Allergies: Negative.   Psychiatric/Behavioral: The patient is nervous/anxious.     Objective:  BP 122/80 (BP Location: Right Arm, Patient Position: Sitting, Cuff Size: Normal)   Pulse 92   Temp 97.7 F (36.5 C) (Oral)   Wt 224 lb (101.6 kg)  SpO2 93%   BMI 42.32 kg/m   Physical Exam  Constitutional: She is oriented to person, place, and time and well-developed, well-nourished, and in no distress.  HENT:  Head: Normocephalic and atraumatic.  Right Ear: External ear normal.  Left Ear: External ear normal.  Nose: Nose normal.  Eyes: Conjunctivae are normal. No scleral icterus.  Neck: No thyromegaly present.  Cardiovascular: Normal rate, regular rhythm and normal heart sounds.  Pulmonary/Chest: Effort normal and breath sounds normal.  Abdominal: Soft.  Neurological: She is alert and oriented to person, place, and time. GCS score is 15.  Skin: Skin is warm and dry.  Psychiatric: Mood, memory and affect normal.    Assessment and Plan :  1. Community acquired  pneumonia of left lower lobe of lung ([place]) Improved. - DG Chest 2 View; Future 2.MDD 3.Obesity 4.DDD For surgery next month. 5.OA  I have done the exam and reviewed the chart and it is accurate to the best of my knowledge. Development worker, community has been used and  any errors in dictation or transcription are unintentional. Miguel Aschoff M.D. [facility]

## 2017-02-20 NOTE — Progress Notes (Signed)
Advised  ED 

## 2017-03-01 ENCOUNTER — Encounter: Payer: Self-pay | Admitting: Family Medicine

## 2017-03-04 DIAGNOSIS — M76821 Posterior tibial tendinitis, right leg: Secondary | ICD-10-CM | POA: Diagnosis not present

## 2017-03-05 ENCOUNTER — Telehealth: Payer: Self-pay | Admitting: Family Medicine

## 2017-03-05 ENCOUNTER — Ambulatory Visit (INDEPENDENT_AMBULATORY_CARE_PROVIDER_SITE_OTHER): Payer: PPO | Admitting: Family Medicine

## 2017-03-05 ENCOUNTER — Encounter: Payer: Self-pay | Admitting: Family Medicine

## 2017-03-05 VITALS — BP 110/68 | HR 104 | Temp 97.7°F | Resp 14

## 2017-03-05 DIAGNOSIS — Z01818 Encounter for other preprocedural examination: Secondary | ICD-10-CM

## 2017-03-05 DIAGNOSIS — G2581 Restless legs syndrome: Secondary | ICD-10-CM | POA: Diagnosis not present

## 2017-03-05 MED ORDER — TEMAZEPAM 15 MG PO CAPS: 15.0000 mg | ORAL_CAPSULE | Freq: Every day | ORAL | 5 refills | Status: DC

## 2017-03-05 MED ORDER — GABAPENTIN 400 MG PO CAPS: 400.0000 mg | ORAL_CAPSULE | Freq: Three times a day (TID) | ORAL | 3 refills | Status: DC

## 2017-03-05 NOTE — Progress Notes (Signed)
Patient: Gail Hardy Female    DOB: 10-Aug-1945   72 y.o.   MRN: [id number] Visit Date: 03/05/2017  Today's Provider: Wilhemena Durie, MD   Chief Complaint  Patient presents with  . Follow-up  . Medical Clearance   Subjective:    HPI  Pt is here for a 2 week follow up. She was seen on 02/19/17 for her follow up of pneumonia. Her chest Xray was cleared.  Pt is suppose to have surgery on 03/24/17 with [facility]. Pt and husband think she is here for surgical clearance. Pt denies any chest pain, shortness of breath, dizziness issues with anesthesia, numbness, tingling or weakness. She reports that right now the only thing that hurts is her buttock and hurts to sit down.        Allergies  Allergen Reactions  . Diazepam     Other reaction(s): Feeling agitated (finding)     Current Outpatient Medications:  .  clonazePAM (KLONOPIN) 0.5 MG tablet, Take 0.5 tablets (0.25 mg total) by mouth every 8 (eight) hours as needed (anxiety)., Disp: 30 tablet, Rfl: 0 .  latanoprost (XALATAN) 0.005 % ophthalmic solution, Place 1 drop into both eyes at bedtime. , Disp: , Rfl:  .  temazepam (RESTORIL) 30 MG capsule, TAKE 1 CAPSULE BY MOUTH ONCE DAILY, Disp: 30 capsule, Rfl: 3 .  acetaminophen-codeine (TYLENOL #3) 300-30 MG tablet, Take 1 tablet by mouth every 8 (eight) hours as needed for moderate pain. (Patient not taking: Reported on 01/06/2017), Disp: 15 tablet, Rfl: 0 .  amphetamine-dextroamphetamine (ADDERALL) 10 MG tablet, TAKE 1 TABLET BY MOUTH TWICE (2) DAILY (Patient not taking: Reported on 01/06/2017), Disp: 60 tablet, Rfl: 0 .  ARIPiprazole (ABILIFY) 30 MG tablet, TAKE 1 TABLET BY MOUTH ONCE DAILY (Patient not taking: Reported on 03/05/2017), Disp: 30 tablet, Rfl: 11 .  buPROPion (WELLBUTRIN XL) 150 MG 24 hr tablet, Take 1 tablet (150 mg total) by mouth daily. (Patient not taking: Reported on 03/05/2017), Disp: 30 tablet, Rfl: 0 .  capsaicin (ZOSTRIX) 0.025 % cream, Apply topically 3  (three) times daily. (Patient not taking: Reported on 01/06/2017), Disp: 60 g, Rfl: 0 .  citalopram (CELEXA) 40 MG tablet, Take 1 tablet (40 mg total) by mouth daily. (Patient not taking: Reported on 01/06/2017), Disp: 30 tablet, Rfl: 0 .  diltiazem (CARDIZEM) 60 MG tablet, Take 1 tablet (60 mg total) by mouth every 8 (eight) hours. (Patient not taking: Reported on 01/06/2017), Disp: 90 tablet, Rfl: 0 .  gabapentin (NEURONTIN) 400 MG capsule, Take 1 capsule (400 mg total) by mouth 3 (three) times daily. (Patient not taking: Reported on 01/06/2017), Disp: 270 capsule, Rfl: 3 .  promethazine (PHENERGAN) 25 MG tablet, TAKE 1/2 TO 1 TABLET BY MOUTH EVERY 6 HOURS AS NEEDED (Patient not taking: Reported on 01/06/2017), Disp: 100 tablet, Rfl: 0  Review of Systems  Constitutional: Negative.   HENT: Negative.   Eyes: Negative.   Respiratory: Negative.   Cardiovascular: Negative.   Gastrointestinal: Negative.   Endocrine: Negative.   Genitourinary: Negative.   Musculoskeletal: Negative.   Skin: Negative.   Allergic/Immunologic: Negative.   Neurological: Positive for tremors.  Hematological: Negative.   Psychiatric/Behavioral: Negative.     Social History   Tobacco Use  . Smoking status: Former Smoker    Years: 10.00    Types: Cigarettes    Last attempt to quit: 02/10/1989    Years since quitting: 28.0  . Smokeless tobacco: Never Used  Substance Use  Topics  . Alcohol use: No   Objective:   BP 110/68 (BP Location: Right Arm, Patient Position: Sitting, Cuff Size: Large)   Pulse (!) 104   Temp 97.7 F (36.5 C) (Oral)   Resp 14   SpO2 98%  Vitals:   03/05/17 1109  BP: 110/68  Pulse: (!) 104  Resp: 14  Temp: 97.7 F (36.5 C)  TempSrc: Oral  SpO2: 98%     Physical Exam  Constitutional: She is oriented to person, place, and time. She appears well-developed and well-nourished.  Obese WF NAD.  HENT:  Head: Normocephalic and atraumatic.  Right Ear: External ear normal.  Left  Ear: External ear normal.  Nose: Nose normal.  Mouth/Throat: Oropharynx is clear and moist.  Eyes: Conjunctivae are normal. No scleral icterus.  Neck: No thyromegaly present.  Cardiovascular: Normal rate, regular rhythm and normal heart sounds.  Pulmonary/Chest: Effort normal and breath sounds normal.  Abdominal: Soft.  Neurological: She is alert and oriented to person, place, and time.  Chroniv Tremor of hands.  Skin: Skin is warm and dry.  Psychiatric: She has a normal mood and affect. Her behavior is normal. Judgment and thought content normal.        Assessment & Plan:     1. Pre-operative clearance Pt cleared medically for back surgery - EKG 12-Lead  2. Restless leg  - gabapentin (NEURONTIN) 400 MG capsule; Take 1 capsule (400 mg total) by mouth 3 (three) times daily.  Dispense: 270 capsule; Refill: 3 3.DDD/OA 4.Obesity 5.MDD/GAD       Wilhemena Durie, MD  [facility]

## 2017-03-05 NOTE — Telephone Encounter (Signed)
FYI/Reminder-She was taken off of this when in the hospital this last time.

## 2017-03-05 NOTE — Telephone Encounter (Signed)
Patient forgot to tell you this morning that she needed a refill for Adderall 10 mg.  Call when ready.

## 2017-03-05 NOTE — Telephone Encounter (Signed)
Pharmacist Gerald Stabs at [facility] called stating he recevied rx for Temazepam 15 mg. Today but he already has a rx on file for her for Temazepam 30 mg.   He wants to know should he d/c the 30 mg. Refills or what the proper mg. Is.

## 2017-03-06 NOTE — Telephone Encounter (Signed)
Stay off

## 2017-03-06 NOTE — Telephone Encounter (Signed)
Chris informed.

## 2017-03-06 NOTE — Telephone Encounter (Signed)
I would like to go to smaller dose due to polypharmacy.

## 2017-03-07 NOTE — Telephone Encounter (Signed)
Advised  ED 

## 2017-03-11 ENCOUNTER — Telehealth: Payer: Self-pay | Admitting: Family Medicine

## 2017-03-11 NOTE — Telephone Encounter (Signed)
[place] I thought this was done Friday? Please review. Thank Edrick Kins, RMA

## 2017-03-11 NOTE — Telephone Encounter (Signed)
Gail Hardy with [facility] is requesting a call back today. Gail Hardy stated that they haven't received pt's surgical clearance they have been requesting since 02/18/17. Pt had OV on 03/05/17 for surgical clearance.   Gail Hardy is requesting the clearance form, last office note, most recent EKG, & labs. Fax# [phone]  CB# [phone]  Please advise. Thanks TNP

## 2017-03-11 NOTE — Telephone Encounter (Signed)
It was faxed on 03/07/17 at 3:07. I re faxed it on 03/11/17 at 10:30.

## 2017-03-17 DIAGNOSIS — Z01818 Encounter for other preprocedural examination: Secondary | ICD-10-CM | POA: Diagnosis not present

## 2017-03-17 DIAGNOSIS — M545 Low back pain: Secondary | ICD-10-CM | POA: Diagnosis not present

## 2017-03-17 DIAGNOSIS — Z01812 Encounter for preprocedural laboratory examination: Secondary | ICD-10-CM | POA: Diagnosis not present

## 2017-03-17 DIAGNOSIS — M5126 Other intervertebral disc displacement, lumbar region: Secondary | ICD-10-CM | POA: Diagnosis not present

## 2017-03-24 DIAGNOSIS — F419 Anxiety disorder, unspecified: Secondary | ICD-10-CM | POA: Diagnosis not present

## 2017-03-24 DIAGNOSIS — Z8673 Personal history of transient ischemic attack (TIA), and cerebral infarction without residual deficits: Secondary | ICD-10-CM | POA: Diagnosis not present

## 2017-03-24 DIAGNOSIS — Z86718 Personal history of other venous thrombosis and embolism: Secondary | ICD-10-CM | POA: Diagnosis not present

## 2017-03-24 DIAGNOSIS — G4700 Insomnia, unspecified: Secondary | ICD-10-CM | POA: Diagnosis not present

## 2017-03-24 DIAGNOSIS — M5127 Other intervertebral disc displacement, lumbosacral region: Secondary | ICD-10-CM | POA: Diagnosis not present

## 2017-03-24 DIAGNOSIS — F40240 Claustrophobia: Secondary | ICD-10-CM | POA: Diagnosis not present

## 2017-03-24 DIAGNOSIS — E7800 Pure hypercholesterolemia, unspecified: Secondary | ICD-10-CM | POA: Diagnosis not present

## 2017-03-24 DIAGNOSIS — Z87891 Personal history of nicotine dependence: Secondary | ICD-10-CM | POA: Diagnosis not present

## 2017-03-24 DIAGNOSIS — K219 Gastro-esophageal reflux disease without esophagitis: Secondary | ICD-10-CM | POA: Diagnosis not present

## 2017-03-24 DIAGNOSIS — F319 Bipolar disorder, unspecified: Secondary | ICD-10-CM | POA: Diagnosis not present

## 2017-03-24 DIAGNOSIS — G251 Drug-induced tremor: Secondary | ICD-10-CM | POA: Diagnosis not present

## 2017-03-24 DIAGNOSIS — Z8711 Personal history of peptic ulcer disease: Secondary | ICD-10-CM | POA: Diagnosis not present

## 2017-03-24 DIAGNOSIS — M5126 Other intervertebral disc displacement, lumbar region: Secondary | ICD-10-CM | POA: Diagnosis not present

## 2017-04-15 ENCOUNTER — Other Ambulatory Visit: Payer: Self-pay | Admitting: Family Medicine

## 2017-04-15 NOTE — Telephone Encounter (Signed)
Pt requesting refill of Clonazepam .5 MG send to [facility]

## 2017-04-18 MED ORDER — CLONAZEPAM 0.5 MG PO TABS: 0.2500 mg | ORAL_TABLET | Freq: Three times a day (TID) | ORAL | 5 refills | Status: DC | PRN

## 2017-04-22 ENCOUNTER — Telehealth: Payer: Self-pay | Admitting: Family Medicine

## 2017-04-22 NOTE — Telephone Encounter (Signed)
Gail Hardy with [facility] is requesting a call back to discuss pt's script for clonazePAM (KLONOPIN) 1 MG tablet and the script for clonazePAM [facility]) .5 MG tablet Rx they received on today. Gail Hardy stated that on 04/17/17 pt picked clonazePAM (KLONOPIN) 1 MG - 90 tablets from the Rx they have on file from 01/01/17 and it still has 3 refills. Gail Hardy wanted to see if there was an error or if he needs to discontinue the refills on the 1 mg. Please advise. Thanks TNP

## 2017-04-22 NOTE — Telephone Encounter (Signed)
pharmacy was informed as below.

## 2017-04-22 NOTE — Telephone Encounter (Signed)
Per Dr. Rosanna Randy. Have cancelled all additional refills on her 1 mg clonazepam and cancelled the 0.5 mg refill. She will call back when she is out of what she has and then we will discuss this with her at that time.

## 2017-05-13 ENCOUNTER — Telehealth: Payer: Self-pay | Admitting: Family Medicine

## 2017-05-13 NOTE — Telephone Encounter (Signed)
Pt called requesting refill on clonazePAM (KLONOPIN) 0.5 MG tablet for anxiety.She would like this sent to [facility]

## 2017-05-15 ENCOUNTER — Other Ambulatory Visit: Payer: Self-pay | Admitting: Family Medicine

## 2017-05-15 MED ORDER — CLONAZEPAM 0.5 MG PO TABS: 0.2500 mg | ORAL_TABLET | Freq: Three times a day (TID) | ORAL | 5 refills | Status: DC | PRN

## 2017-05-15 NOTE — Telephone Encounter (Signed)
sent 

## 2017-05-15 NOTE — Telephone Encounter (Signed)
Patient states that she is completely out of this medication and her brother has passed away and she can not get this off of her mind.

## 2017-05-15 NOTE — Telephone Encounter (Signed)
Pt advised.

## 2017-05-19 DIAGNOSIS — H401121 Primary open-angle glaucoma, left eye, mild stage: Secondary | ICD-10-CM | POA: Diagnosis not present

## 2017-05-29 ENCOUNTER — Other Ambulatory Visit: Payer: Self-pay | Admitting: Family Medicine

## 2017-08-06 ENCOUNTER — Ambulatory Visit: Payer: PPO

## 2017-08-07 NOTE — Telephone Encounter (Signed)
This encounter was created in error - please disregard.

## 2017-08-27 ENCOUNTER — Ambulatory Visit: Payer: PPO

## 2017-09-04 ENCOUNTER — Other Ambulatory Visit: Payer: Self-pay | Admitting: Family Medicine

## 2017-09-09 ENCOUNTER — Ambulatory Visit (INDEPENDENT_AMBULATORY_CARE_PROVIDER_SITE_OTHER): Payer: PPO

## 2017-09-09 VITALS — BP 146/68 | HR 99 | Temp 98.1°F | Ht 59.0 in | Wt 219.0 lb

## 2017-09-09 DIAGNOSIS — Z0000 Encounter for general adult medical examination without abnormal findings: Secondary | ICD-10-CM

## 2017-09-09 DIAGNOSIS — Z1211 Encounter for screening for malignant neoplasm of colon: Secondary | ICD-10-CM

## 2017-09-09 NOTE — Patient Instructions (Addendum)
Gail Hardy , Thank you for taking time to come for your Medicare Wellness Visit. I appreciate your ongoing commitment to your health goals. Please review the following plan we discussed and let me know if I can assist you in the future.   Screening recommendations/referrals: Colonoscopy: Cologuard referral sent today. Pt declined colonoscopy. Mammogram: Pt declines today.  Bone Density: Up to date Recommended yearly ophthalmology/optometry visit for glaucoma screening and checkup Recommended yearly dental visit for hygiene and checkup  Vaccinations: Influenza vaccine: Up to date Pneumococcal vaccine: Prevnar 13 up to date, declined Pneumovax 23. Tdap vaccine: Pt declines today.  Shingles vaccine: Pt declines today.     Advanced directives: Please bring a copy of your POA (Power of Attorney) and/or Living Will to your next appointment.   Conditions/risks identified: Fall risk prevenetion; Obesity- recommend starting some form of light exercise 3 days a week for 30 minutes at a time.   Next appointment: 09/24/17 @ 11:20 AM with Dr Rosanna Randy.    Preventive Care 72 Years and Older, Female Preventive care refers to lifestyle choices and visits with your health care provider that can promote health and wellness. What does preventive care include?  A yearly physical exam. This is also called an annual well check.  Dental exams once or twice a year.  Routine eye exams. Ask your health care provider how often you should have your eyes checked.  Personal lifestyle choices, including:  Daily care of your teeth and gums.  Regular physical activity.  Eating a healthy diet.  Avoiding tobacco and drug use.  Limiting alcohol use.  Practicing safe sex.  Taking low-dose aspirin every day.  Taking vitamin and mineral supplements as recommended by your health care provider. What happens during an annual well check? The services and screenings done by your health care provider during  your annual well check will depend on your age, overall health, lifestyle risk factors, and family history of disease. Counseling  Your health care provider may ask you questions about your:  Alcohol use.  Tobacco use.  Drug use.  Emotional well-being.  Home and relationship well-being.  Sexual activity.  Eating habits.  History of falls.  Memory and ability to understand (cognition).  Work and work Statistician.  Reproductive health. Screening  You may have the following tests or measurements:  Height, weight, and BMI.  Blood pressure.  Lipid and cholesterol levels. These may be checked every 5 years, or more frequently if you are over 51 years old.  Skin check.  Lung cancer screening. You may have this screening every year starting at age 72 if you have a 30-pack-year history of smoking and currently smoke or have quit within the past 15 years.  Fecal occult blood test (FOBT) of the stool. You may have this test every year starting at age 30.  Flexible sigmoidoscopy or colonoscopy. You may have a sigmoidoscopy every 5 years or a colonoscopy every 10 years starting at age 45.  Hepatitis C blood test.  Hepatitis B blood test.  Sexually transmitted disease (STD) testing.  Diabetes screening. This is done by checking your blood sugar (glucose) after you have not eaten for a while (fasting). You may have this done every 1-3 years.  Bone density scan. This is done to screen for osteoporosis. You may have this done starting at age 72.  Mammogram. This may be done every 1-2 years. Talk to your health care provider about how often you should have regular mammograms. Talk with your health  care provider about your test results, treatment options, and if necessary, the need for more tests. Vaccines  Your health care provider may recommend certain vaccines, such as:  Influenza vaccine. This is recommended every year.  Tetanus, diphtheria, and acellular pertussis (Tdap,  Td) vaccine. You may need a Td booster every 10 years.  Zoster vaccine. You may need this after age 65.  Pneumococcal 13-valent conjugate (PCV13) vaccine. One dose is recommended after age 63.  Pneumococcal polysaccharide (PPSV23) vaccine. One dose is recommended after age 46. Talk to your health care provider about which screenings and vaccines you need and how often you need them. This information is not intended to replace advice given to you by your health care provider. Make sure you discuss any questions you have with your health care provider. Document Released: 02/24/2015 Document Revised: 10/18/2015 Document Reviewed: 11/29/2014 Elsevier Interactive Patient Education  2017 [place] Prevention in the Home Falls can cause injuries. They can happen to people of all ages. There are many things you can do to make your home safe and to help prevent falls. What can I do on the outside of my home?  Regularly fix the edges of walkways and driveways and fix any cracks.  Remove anything that might make you trip as you walk through a door, such as a raised step or threshold.  Trim any bushes or trees on the path to your home.  Use bright outdoor lighting.  Clear any walking paths of anything that might make someone trip, such as rocks or tools.  Regularly check to see if handrails are loose or broken. Make sure that both sides of any steps have handrails.  Any raised decks and porches should have guardrails on the edges.  Have any leaves, snow, or ice cleared regularly.  Use sand or salt on walking paths during winter.  Clean up any spills in your garage right away. This includes oil or grease spills. What can I do in the bathroom?  Use night lights.  Install grab bars by the toilet and in the tub and shower. Do not use towel bars as grab bars.  Use non-skid mats or decals in the tub or shower.  If you need to sit down in the shower, use a plastic, non-slip  stool.  Keep the floor dry. Clean up any water that spills on the floor as soon as it happens.  Remove soap buildup in the tub or shower regularly.  Attach bath mats securely with double-sided non-slip rug tape.  Do not have throw rugs and other things on the floor that can make you trip. What can I do in the bedroom?  Use night lights.  Make sure that you have a light by your bed that is easy to reach.  Do not use any sheets or blankets that are too big for your bed. They should not hang down onto the floor.  Have a firm chair that has side arms. You can use this for support while you get dressed.  Do not have throw rugs and other things on the floor that can make you trip. What can I do in the kitchen?  Clean up any spills right away.  Avoid walking on wet floors.  Keep items that you use a lot in easy-to-reach places.  If you need to reach something above you, use a strong step stool that has a grab bar.  Keep electrical cords out of the way.  Do not use floor  polish or wax that makes floors slippery. If you must use wax, use non-skid floor wax.  Do not have throw rugs and other things on the floor that can make you trip. What can I do with my stairs?  Do not leave any items on the stairs.  Make sure that there are handrails on both sides of the stairs and use them. Fix handrails that are broken or loose. Make sure that handrails are as long as the stairways.  Check any carpeting to make sure that it is firmly attached to the stairs. Fix any carpet that is loose or worn.  Avoid having throw rugs at the top or bottom of the stairs. If you do have throw rugs, attach them to the floor with carpet tape.  Make sure that you have a light switch at the top of the stairs and the bottom of the stairs. If you do not have them, ask someone to add them for you. What else can I do to help prevent falls?  Wear shoes that:  Do not have high heels.  Have rubber bottoms.  Are  comfortable and fit you well.  Are closed at the toe. Do not wear sandals.  If you use a stepladder:  Make sure that it is fully opened. Do not climb a closed stepladder.  Make sure that both sides of the stepladder are locked into place.  Ask someone to hold it for you, if possible.  Clearly mark and make sure that you can see:  Any grab bars or handrails.  First and last steps.  Where the edge of each step is.  Use tools that help you move around (mobility aids) if they are needed. These include:  Canes.  Walkers.  Scooters.  Crutches.  Turn on the lights when you go into a dark area. Replace any light bulbs as soon as they burn out.  Set up your furniture so you have a clear path. Avoid moving your furniture around.  If any of your floors are uneven, fix them.  If there are any pets around you, be aware of where they are.  Review your medicines with your doctor. Some medicines can make you feel dizzy. This can increase your chance of falling. Ask your doctor what other things that you can do to help prevent falls. This information is not intended to replace advice given to you by your health care provider. Make sure you discuss any questions you have with your health care provider. Document Released: 11/24/2008 Document Revised: 07/06/2015 Document Reviewed: 03/04/2014 Elsevier Interactive Patient Education  2017 [employer].

## 2017-09-09 NOTE — Progress Notes (Signed)
Subjective:   Gail Hardy is a 72 y.o. female who presents for Medicare Annual (Subsequent) preventive examination.  Review of Systems:  N/A  Cardiac Risk Factors include: advanced age (>67men, >110 women);obesity (BMI >30kg/m2);dyslipidemia     Objective:     Vitals: BP (!) 146/68 (BP Location: Right Arm)   Pulse 99   Temp 98.1 F (36.7 C) (Oral)   Ht 4\' 11"  (1.499 m)   Wt 219 lb (99.3 kg)   BMI 44.23 kg/m   Body mass index is 44.23 kg/m.  Advanced Directives 09/09/2017 01/01/2017 01/01/2017 01/01/2017 07/31/2016 03/15/2015 11/09/2014  Does Patient Have a Medical Advance Directive? Yes Yes - Yes Yes No Yes  Type of Paramedic of [place];Living will - Living will Living will [place];Living will - [place];Living will  Does patient want to make changes to medical advance directive? - No - Patient declined - - - - -  Copy of [place] in Chart? No - copy requested - - - Yes - -    Tobacco Social History   Tobacco Use  Smoking Status Former Smoker  . Years: 10.00  . Types: Cigarettes  . Last attempt to quit: 02/10/1989  . Years since quitting: 28.5  Smokeless Tobacco Never Used     Counseling given: Not Answered   Clinical Intake:  Pre-visit preparation completed: Yes  Pain : 0-10 Pain Score: 8  Pain Type: Chronic pain(post sx) Pain Location: Buttocks Pain Orientation: Left Pain Descriptors / Indicators: Nagging Pain Frequency: Intermittent(worse with pressure)     Nutritional Status: BMI > 30  Obese Nutritional Risks: Nausea/ vomitting/ diarrhea(nausea and diarrhea occasionally due to foods) Diabetes: No  How often do you need to have someone help you when you read instructions, pamphlets, or other written materials from your doctor or pharmacy?: 1 - Never  Interpreter Needed?: No  Information entered by :: [facility], LPN  Past Medical History:  Diagnosis Date  .  Anemia   . Aneurysm ([place]) 2011  . Blood transfusion without reported diagnosis 2013,2014   x 2  . Cancer ([place])   . Depression   . Family history of adverse reaction to anesthesia    daughter nausea and vomiting  . Glaucoma   . Hernia   . Hyperlipidemia   . Pinched nerve   . Pulmonary emboli ([place]) 2011  . Stroke [facility]) 2010   Past Surgical History:  Procedure Laterality Date  . ABDOMINAL HYSTERECTOMY    . BREAST EXCISIONAL BIOPSY Right    pt states she had "lumps removed from breast as a teen"  . CEREBRAL ANEURYSM REPAIR  2011   stent and coil insertion  . CHOLECYSTECTOMY    . COLONOSCOPY  2013   [facility]  . HERNIA REPAIR  2014   lap paraesophageal repair  . INTRAMEDULLARY (IM) NAIL INTERTROCHANTERIC Left 10/19/2014   Procedure: INTRAMEDULLARY (IM) NAIL INTERTROCHANTRIC;  Surgeon: Earnestine Leys, MD;  Location: [facility] ORS;  Service: Orthopedics;  Laterality: Left;  . UPPER GI ENDOSCOPY  2013, 2014   [place]   Family History  Problem Relation Age of Onset  . Emphysema Father   . COPD Mother   . Heart disease Mother   . Heart disease Brother   . Dementia Brother   . Liver disease Brother   . Heart disease Brother    Social History   Socioeconomic History  . Marital status: Married    Spouse name: [name]  .  Number of children: 2  . Years of education: Not on file  . Highest education level: 10th grade  Occupational History  . Occupation: retired  Scientific laboratory technician  . Financial resource strain: Not hard at all  . Food insecurity:    Worry: Never true    Inability: Never true  . Transportation needs:    Medical: No    Non-medical: No  Tobacco Use  . Smoking status: Former Smoker    Years: 10.00    Types: Cigarettes    Last attempt to quit: 02/10/1989    Years since quitting: 28.5  . Smokeless tobacco: Never Used  Substance and Sexual Activity  . Alcohol use: No  . Drug use: No  . Sexual activity: Never  Lifestyle  . Physical activity:    Days per week: Not on  file    Minutes per session: Not on file  . Stress: To some extent  Relationships  . Social connections:    Talks on phone: Not on file    Gets together: Not on file    Attends religious service: Not on file    Active member of club or organization: Not on file    Attends meetings of clubs or organizations: Not on file    Relationship status: Not on file  Other Topics Concern  . Not on file  Social History Narrative  . Not on file    Outpatient Encounter Medications as of 09/09/2017  Medication Sig  . ARIPiprazole (ABILIFY) 30 MG tablet TAKE 1 TABLET BY MOUTH ONCE DAILY  . buPROPion (WELLBUTRIN SR) 150 MG 12 hr tablet TAKE 1 TABLET BY MOUTH ONCE A DAY  . buPROPion (WELLBUTRIN XL) 150 MG 24 hr tablet Take 1 tablet (150 mg total) by mouth daily.  . citalopram (CELEXA) 40 MG tablet Take 1 tablet (40 mg total) by mouth daily.  . clonazePAM (KLONOPIN) 0.5 MG tablet Take 0.5 tablets (0.25 mg total) by mouth every 8 (eight) hours as needed (anxiety).  . gabapentin (NEURONTIN) 400 MG capsule Take 1 capsule (400 mg total) by mouth 3 (three) times daily.  Marland Kitchen latanoprost (XALATAN) 0.005 % ophthalmic solution Place 1 drop into both eyes at bedtime.   . temazepam (RESTORIL) 15 MG capsule Take 1 capsule (15 mg total) by mouth daily.  Marland Kitchen acetaminophen-codeine (TYLENOL #3) 300-30 MG tablet Take 1 tablet by mouth every 8 (eight) hours as needed for moderate pain. (Patient not taking: Reported on 01/06/2017)  . amphetamine-dextroamphetamine (ADDERALL) 10 MG tablet TAKE 1 TABLET BY MOUTH TWICE (2) DAILY (Patient not taking: Reported on 01/06/2017)  . capsaicin (ZOSTRIX) 0.025 % cream Apply topically 3 (three) times daily. (Patient not taking: Reported on 01/06/2017)  . citalopram (CELEXA) 40 MG tablet TAKE 1 TABLET BY MOUTH ONCE A DAY  . diltiazem (CARDIZEM) 60 MG tablet Take 1 tablet (60 mg total) by mouth every 8 (eight) hours. (Patient not taking: Reported on 01/06/2017)  . promethazine (PHENERGAN) 25  MG tablet TAKE 1/2 TO 1 TABLET BY MOUTH EVERY 6 HOURS AS NEEDED (Patient not taking: Reported on 01/06/2017)   No facility-administered encounter medications on file as of 09/09/2017.     Activities of Daily Living In your present state of health, do you have any difficulty performing the following activities: 09/09/2017 01/01/2017  Hearing? 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 -  Vision? N N  Difficulty concentrating or making decisions? Tempie Donning  Walking or  climbing stairs? Y Y  Comment Due to tremors and weakness.  -  Dressing or bathing? Tempie Donning  Comment Has assistance drying off after shower.  -  Doing errands, shopping? Y Y  Comment Does not drive.  -  Preparing Food and eating ? N -  Comment Husband cooks meals.  -  Using the Toilet? N -  In the past six months, have you accidently leaked urine? N -  Do you have problems with loss of bowel control? N -  Managing your Medications? N -  Managing your Finances? Y -  Comment Husband manages finances.  -  Housekeeping or managing your Housekeeping? Y -  Comment Husband does house keeping.  -  Some recent data might be hidden    Patient Care Team: Jerrol Banana., MD as PCP - General (Family Medicine) Birder Robson, MD as Referring Physician (Ophthalmology) Earnestine Leys, MD as Consulting Physician (Specialist) Dimmig, Marcello Moores, MD as Referring Physician (Orthopedic Surgery)    Assessment:   This is a routine wellness examination for [place].  Exercise Activities and Dietary recommendations Current Exercise Habits: The patient does not participate in regular exercise at present, Exercise limited by: orthopedic condition(s)  Goals    . Exercise 150 minutes per week (moderate activity)    . Exercise 3x per week (30 min per time)     Recommend starting some form of light exercise 3 days a week for 30 minutes at a time.        Fall Risk Fall Risk  09/09/2017 07/31/2016 07/31/2015 03/15/2015  09/20/2014  Falls in the past year? Yes Yes Yes Yes Yes  Number falls in past yr: 1 1 1 2  or more 2 or more  Injury with Fall? No No - Yes Yes  Risk Factor Category  - - - High Fall Risk -  Risk for fall due to : - - - - Impaired balance/gait  Follow up Falls prevention discussed Falls prevention discussed - Falls prevention discussed -   Is the patient's home free of loose throw rugs in walkways, pet beds, electrical cords, etc?   yes      Grab bars in the bathroom? yes      Handrails on the stairs?   no      Adequate lighting?   yes  Timed Get Up and Go performed: N/A  Depression Screen PHQ 2/9 Scores 09/09/2017 07/31/2016 04/24/2016 07/31/2015  PHQ - 2 Score 5 1 4 3   PHQ- 9 Score 18 - 18 14     Cognitive Function: Pt declined screening today.      6CIT Screen 07/31/2016  What Year? 0 points  What month? 0 points  What time? 0 points  Count back from 20 4 points  Months in reverse 4 points  Repeat phrase 4 points  Total Score 12    Immunization History  Administered Date(s) Administered  . Influenza Inj Mdck Quad Pf 12/28/2016  . Influenza, High Dose Seasonal PF 11/09/2014, 10/31/2015  . Pneumococcal Conjugate-13 08/19/2013    Qualifies for Shingles Vaccine? Due for Shingles vaccine. Declined my offer to administer today. Education has been provided regarding the importance of this vaccine. Pt has been advised to call her insurance company to determine her out of pocket expense. Advised she may also receive this vaccine at her local pharmacy or Health Dept. Verbalized acceptance and understanding.  Screening Tests Health Maintenance  Topic Date Due  . Hepatitis C Screening  [birth year]  . COLONOSCOPY  08/14/1995  . PNA vac Low Risk Adult (2 of 2 - PPSV23) 08/20/2014  . MAMMOGRAM  03/21/2017  . TETANUS/TDAP  02/11/2026 (Originally 08/13/1964)  . INFLUENZA VACCINE  09/11/2017  . DEXA SCAN  Completed    Cancer Screenings: Lung: Low Dose CT Chest recommended if Age 82-80  years, 30 pack-year currently smoking OR have quit w/in 15years. Patient does not qualify. Breast:  Up to date on Mammogram? No, pt declined setting this up.   Up to date of Bone Density/Dexa? Yes Colorectal: Cologuard referral sent today.   Additional Screenings:  Hepatitis C Screening: Pt declines today.      Plan:  I have personally reviewed and addressed the Medicare Annual Wellness questionnaire and have noted the following in the patient's chart:  A. Medical and social history B. Use of alcohol, tobacco or illicit drugs  C. Current medications and supplements D. Functional ability and status E.  Nutritional status F.  Physical activity G. Advance directives H. List of other physicians I.  Hospitalizations, surgeries, and ER visits in previous 12 months J.  [place] such as hearing and vision if needed, cognitive and depression L. Referrals and appointments - none  In addition, I have reviewed and discussed with patient certain preventive protocols, quality metrics, and best practice recommendations. A written personalized care plan for preventive services as well as general preventive health recommendations were provided to patient.  See attached scanned questionnaire for additional information.   Signed,  Fabio Neighbors, LPN Nurse Health Advisor   Nurse Recommendations: Pt declined the Hep C lab, setting up a mammogram, Pneumovax 23 and tetanus vaccine today. Cologuard referral sent, pt declined a colonoscopy referral.

## 2017-09-11 ENCOUNTER — Other Ambulatory Visit: Payer: Self-pay | Admitting: Family Medicine

## 2017-09-24 ENCOUNTER — Ambulatory Visit (INDEPENDENT_AMBULATORY_CARE_PROVIDER_SITE_OTHER): Payer: PPO | Admitting: Family Medicine

## 2017-09-24 VITALS — BP 138/68 | HR 108 | Temp 97.6°F | Resp 18 | Wt 220.0 lb

## 2017-09-24 DIAGNOSIS — M150 Primary generalized (osteo)arthritis: Secondary | ICD-10-CM | POA: Diagnosis not present

## 2017-09-24 DIAGNOSIS — I671 Cerebral aneurysm, nonruptured: Secondary | ICD-10-CM

## 2017-09-24 DIAGNOSIS — Z6841 Body Mass Index (BMI) 40.0 and over, adult: Secondary | ICD-10-CM | POA: Diagnosis not present

## 2017-09-24 DIAGNOSIS — F339 Major depressive disorder, recurrent, unspecified: Secondary | ICD-10-CM | POA: Diagnosis not present

## 2017-09-24 DIAGNOSIS — M159 Polyosteoarthritis, unspecified: Secondary | ICD-10-CM

## 2017-09-24 NOTE — Progress Notes (Signed)
Gail Hardy  MRN: [id number] DOB: June 06, 1945  Subjective:  HPI  The patient is a 72 year old female who presents for evaluation of weight gain.  She has asked to have her Adderall started again.  She states that she is having trouble focusing and she said it also helps her lose weight.  Patient Active Problem List   Diagnosis Date Noted  . Confusion 01/01/2017  . Pneumonia 01/01/2017  . History of calcium pyrophosphate deposition disease (CPPD) 10/08/2016  . Pseudogout involving multiple joints 10/08/2016  . Osteoarthritis 10/08/2016  . Lumbar facet hypertrophy 10/08/2016  . Spondylosis of lumbar spine 10/08/2016  . Chronic low back pain (Bilateral) 10/08/2016  . Chronic shoulder pain (Left) 10/08/2016  . Long term prescription benzodiazepine use 10/08/2016  . Long term prescription opiate use 10/08/2016  . Long term (current) use of opiate analgesic 10/08/2016  . Opiate use 10/08/2016  . Chronic knee pain (Left) 10/08/2016  . Osteoarthritis of knee (Left) 10/08/2016  . Chronic hip pain (Left) 10/08/2016  . Osteoarthritis of hip (Left) 10/08/2016  . Chronic pain syndrome 10/08/2016  . Disturbance of skin sensation 10/08/2016  . Cerebrovascular accident (CVA) ([place]) 03/15/2015  . Pressure ulcer 10/22/2014  . Stress fracture of hip with delayed healing 10/19/2014  . Allergic rhinitis 09/20/2014  . Anxiety disorder 09/20/2014  . Absolute anemia 09/20/2014  . Bipolar 1 disorder, depressed ([place]) 09/20/2014  . Body mass index of 60 or higher 09/20/2014  . Anemia due to blood loss 09/20/2014  . Aneurysm, cerebral 09/20/2014  . Chronic headache 09/20/2014  . Claustrophobia 09/20/2014  . Cerebral vascular accident ([place]) 09/20/2014  . Acute confusion due to medical condition 09/20/2014  . Clinical depression 09/20/2014  . 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 Diaphragmatic hernia 09/20/2014  . Gastric erosion 09/20/2014  . Enterogastritis 09/20/2014  .  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 Hypercholesteremia 09/20/2014  . Cannot sleep 09/20/2014  . Malaise and fatigue 09/20/2014  . Bad memory 09/20/2014  . Altered blood in stool 09/20/2014  . Candida glabrata infection 09/20/2014  . Decreased motor strength 09/20/2014  . Adiposity 09/20/2014  . Excess weight 09/20/2014  . Paralysis agitans ([place]) 09/20/2014  . Breakdown 09/20/2014  . PE (pulmonary embolism) 09/20/2014  . Hypercholesterolemia without hypertriglyceridemia 09/20/2014  . Restless leg 09/20/2014  . Neuralgia neuritis, sciatic nerve 09/20/2014  . Gastric ulcer 09/20/2014  . Fast heart beat 09/20/2014  . Temporary cerebral vascular dysfunction 09/20/2014  . Has a tremor 09/20/2014  . Adynamia 09/20/2014  . Classical migraine with intractable migraine 08/10/2013  . Dysphagia, unspecified(787.20) 08/05/2012  . Paraesophageal hernia 08/04/2012    Past Medical History:  Diagnosis Date  . Anemia   . Aneurysm ([place]) 2011  . Blood transfusion without reported diagnosis 2013,2014   x 2  . Cancer ([place])   . Depression   . Family history of adverse reaction to anesthesia    daughter nausea and vomiting  . Glaucoma   . Hernia   . Hyperlipidemia   . Pinched nerve   . Pulmonary emboli ([place]) 2011  . Stroke [facility]) 2010    Social History   Socioeconomic History  . Marital status: Married    Spouse name: [name]  . Number of children: 2  . Years of education: Not on file  . Highest education level: 10th grade  Occupational History  . Occupation: retired  Scientific laboratory technician  . Financial resource strain: Not hard at all  . Food insecurity:    Worry: Never  true    Inability: Never true  . Transportation needs:    Medical: No    Non-medical: No  Tobacco Use  . Smoking status: Former Smoker    Years: 10.00    Types: Cigarettes    Last attempt to quit: 02/10/1989    Years since quitting: 28.6  . Smokeless tobacco: Never Used  Substance and Sexual Activity  . Alcohol use: No   . Drug use: No  . Sexual activity: Never  Lifestyle  . Physical activity:    Days per week: Not on file    Minutes per session: Not on file  . Stress: To some extent  Relationships  . Social connections:    Talks on phone: Not on file    Gets together: Not on file    Attends religious service: Not on file    Active member of club or organization: Not on file    Attends meetings of clubs or organizations: Not on file    Relationship status: Not on file  . Intimate partner violence:    Fear of current or ex partner: Not on file    Emotionally abused: Not on file    Physically abused: Not on file    Forced sexual activity: Not on file  Other Topics Concern  . Not on file  Social History Narrative  . Not on file    Outpatient Encounter Medications as of 09/24/2017  Medication Sig  . ARIPiprazole (ABILIFY) 30 MG tablet TAKE 1 TABLET BY MOUTH ONCE DAILY  . buPROPion (WELLBUTRIN SR) 150 MG 12 hr tablet TAKE 1 TABLET BY MOUTH ONCE A DAY  . citalopram (CELEXA) 40 MG tablet Take 1 tablet (40 mg total) by mouth daily.  . clonazePAM (KLONOPIN) 0.5 MG tablet Take 0.5 tablets (0.25 mg total) by mouth every 8 (eight) hours as needed (anxiety).  . gabapentin (NEURONTIN) 400 MG capsule Take 1 capsule (400 mg total) by mouth 3 (three) times daily.  Marland Kitchen latanoprost (XALATAN) 0.005 % ophthalmic solution Place 1 drop into both eyes at bedtime.   . temazepam (RESTORIL) 15 MG capsule TAKE 1 CAPSULE BY MOUTH DAILY  . acetaminophen-codeine (TYLENOL #3) 300-30 MG tablet Take 1 tablet by mouth every 8 (eight) hours as needed for moderate pain. (Patient not taking: Reported on 01/06/2017)  . amphetamine-dextroamphetamine (ADDERALL) 10 MG tablet TAKE 1 TABLET BY MOUTH TWICE (2) DAILY (Patient not taking: Reported on 01/06/2017)  . capsaicin (ZOSTRIX) 0.025 % cream Apply topically 3 (three) times daily. (Patient not taking: Reported on 01/06/2017)  . diltiazem (CARDIZEM) 60 MG tablet Take 1 tablet (60 mg  total) by mouth every 8 (eight) hours. (Patient not taking: Reported on 01/06/2017)  . promethazine (PHENERGAN) 25 MG tablet TAKE 1/2 TO 1 TABLET BY MOUTH EVERY 6 HOURS AS NEEDED (Patient not taking: Reported on 01/06/2017)  . [DISCONTINUED] buPROPion (WELLBUTRIN XL) 150 MG 24 hr tablet Take 1 tablet (150 mg total) by mouth daily.  . [DISCONTINUED] citalopram (CELEXA) 40 MG tablet TAKE 1 TABLET BY MOUTH ONCE A DAY   No facility-administered encounter medications on file as of 09/24/2017.     Allergies  Allergen Reactions  . Diazepam     Other reaction(s): Feeling agitated (finding)    Review of Systems  Constitutional: Negative for fever and malaise/fatigue.  Eyes: Negative.   Respiratory: Negative for cough, shortness of breath and wheezing.   Cardiovascular: Negative for chest pain, palpitations, orthopnea, claudication and leg swelling.  Gastrointestinal: Negative.   Skin: Negative.  Neurological: Positive for weakness.  Endo/Heme/Allergies: Negative.   Psychiatric/Behavioral: Positive for depression. Negative for hallucinations, memory loss, substance abuse and suicidal ideas. The patient is nervous/anxious. The patient does not have insomnia.     Objective:  BP 138/68 (BP Location: Right Arm, Patient Position: Sitting, Cuff Size: Normal)   Pulse (!) 108   Temp 97.6 F (36.4 C) (Oral)   Resp 18   Wt 220 lb (99.8 kg)   SpO2 96%   BMI 44.43 kg/m   Physical Exam  Constitutional: She is oriented to person, place, and time and well-developed, well-nourished, and in no distress.  HENT:  Head: Normocephalic and atraumatic.  Eyes: Conjunctivae are normal.  Neck: No thyromegaly present.  Cardiovascular: Normal rate, regular rhythm and normal heart sounds.  Pulmonary/Chest: Effort normal and breath sounds normal.  Abdominal: Soft.  Musculoskeletal: She exhibits no edema.  Neurological: She is alert and oriented to person, place, and time. Gait normal. GCS score is 15.  Skin:  Skin is warm and dry.  Psychiatric: Mood, memory, affect and judgment normal.    Assessment and Plan :  Bipolar Disorder Controlled with abilify. Obesity No stimulants in 72 yo with h/o cerebral aneurysm. OA H/o PE  I have done the exam and reviewed the chart and it is accurate to the best of my knowledge. Development worker, community has been used and  any errors in dictation or transcription are unintentional. Miguel Aschoff M.D. [facility]

## 2017-10-16 DIAGNOSIS — M5416 Radiculopathy, lumbar region: Secondary | ICD-10-CM | POA: Diagnosis not present

## 2017-11-01 ENCOUNTER — Other Ambulatory Visit: Payer: Self-pay | Admitting: Family Medicine

## 2017-11-03 DIAGNOSIS — M5416 Radiculopathy, lumbar region: Secondary | ICD-10-CM | POA: Diagnosis not present

## 2017-11-10 ENCOUNTER — Other Ambulatory Visit: Payer: Self-pay | Admitting: Family Medicine

## 2017-11-22 ENCOUNTER — Ambulatory Visit: Payer: Self-pay

## 2017-12-04 DIAGNOSIS — M5416 Radiculopathy, lumbar region: Secondary | ICD-10-CM | POA: Diagnosis not present

## 2017-12-22 DIAGNOSIS — H401121 Primary open-angle glaucoma, left eye, mild stage: Secondary | ICD-10-CM | POA: Diagnosis not present

## 2017-12-23 DIAGNOSIS — Z01818 Encounter for other preprocedural examination: Secondary | ICD-10-CM | POA: Diagnosis not present

## 2017-12-23 DIAGNOSIS — Z01812 Encounter for preprocedural laboratory examination: Secondary | ICD-10-CM | POA: Diagnosis not present

## 2017-12-23 DIAGNOSIS — F419 Anxiety disorder, unspecified: Secondary | ICD-10-CM | POA: Diagnosis not present

## 2017-12-23 DIAGNOSIS — M5416 Radiculopathy, lumbar region: Secondary | ICD-10-CM | POA: Diagnosis not present

## 2017-12-23 DIAGNOSIS — Z01811 Encounter for preprocedural respiratory examination: Secondary | ICD-10-CM | POA: Diagnosis not present

## 2017-12-23 DIAGNOSIS — Z01810 Encounter for preprocedural cardiovascular examination: Secondary | ICD-10-CM | POA: Diagnosis not present

## 2017-12-25 ENCOUNTER — Other Ambulatory Visit: Payer: Self-pay | Admitting: Family Medicine

## 2017-12-25 DIAGNOSIS — G2581 Restless legs syndrome: Secondary | ICD-10-CM

## 2017-12-29 DIAGNOSIS — F319 Bipolar disorder, unspecified: Secondary | ICD-10-CM | POA: Diagnosis not present

## 2017-12-29 DIAGNOSIS — Z87891 Personal history of nicotine dependence: Secondary | ICD-10-CM | POA: Diagnosis not present

## 2017-12-29 DIAGNOSIS — F419 Anxiety disorder, unspecified: Secondary | ICD-10-CM | POA: Diagnosis not present

## 2017-12-29 DIAGNOSIS — M5126 Other intervertebral disc displacement, lumbar region: Secondary | ICD-10-CM | POA: Diagnosis not present

## 2017-12-29 DIAGNOSIS — M545 Low back pain: Secondary | ICD-10-CM | POA: Diagnosis not present

## 2017-12-29 DIAGNOSIS — Z8673 Personal history of transient ischemic attack (TIA), and cerebral infarction without residual deficits: Secondary | ICD-10-CM | POA: Diagnosis not present

## 2017-12-29 DIAGNOSIS — D649 Anemia, unspecified: Secondary | ICD-10-CM | POA: Diagnosis not present

## 2017-12-29 DIAGNOSIS — Z8711 Personal history of peptic ulcer disease: Secondary | ICD-10-CM | POA: Diagnosis not present

## 2017-12-29 DIAGNOSIS — Z86711 Personal history of pulmonary embolism: Secondary | ICD-10-CM | POA: Diagnosis not present

## 2017-12-29 DIAGNOSIS — R251 Tremor, unspecified: Secondary | ICD-10-CM | POA: Diagnosis not present

## 2017-12-29 DIAGNOSIS — M5127 Other intervertebral disc displacement, lumbosacral region: Secondary | ICD-10-CM | POA: Diagnosis not present

## 2017-12-29 DIAGNOSIS — E7800 Pure hypercholesterolemia, unspecified: Secondary | ICD-10-CM | POA: Diagnosis not present

## 2017-12-29 DIAGNOSIS — K219 Gastro-esophageal reflux disease without esophagitis: Secondary | ICD-10-CM | POA: Diagnosis not present

## 2017-12-29 DIAGNOSIS — K279 Peptic ulcer, site unspecified, unspecified as acute or chronic, without hemorrhage or perforation: Secondary | ICD-10-CM | POA: Diagnosis not present

## 2017-12-29 DIAGNOSIS — H409 Unspecified glaucoma: Secondary | ICD-10-CM | POA: Diagnosis not present

## 2017-12-29 DIAGNOSIS — Z86718 Personal history of other venous thrombosis and embolism: Secondary | ICD-10-CM | POA: Diagnosis not present

## 2018-01-05 ENCOUNTER — Other Ambulatory Visit: Payer: Self-pay

## 2018-01-05 NOTE — Patient Outreach (Signed)
[facility]) Care Management  01/05/2018  Gail Hardy 24-Aug-1945 [id number]     Transition of Care Referral  Referral Date: 01/05/18 Referral Source: HTA Discharge Report Date of Admission: unknown Diagnosis: unknown Date of Discharge: 12/30/17 Facility: [facility] Insurance:HTA   Referral received. No outreach warranted at this time. TOC will be completed by primary care provider office who will refer to [facility] care mgmt if needed.    Plan: RN CM will close case at this time.    Enzo Montgomery, RN,BSN,CCM [place] Management Telephonic Care Management Coordinator Direct Phone: [phone] Toll Free: [phone] Fax: [phone]

## 2018-02-07 ENCOUNTER — Other Ambulatory Visit: Payer: Self-pay | Admitting: Family Medicine

## 2018-02-12 NOTE — Telephone Encounter (Signed)
Pt calling requesting refill of temazepam (RESTORIL) 15 MG capsule sent [place]

## 2018-02-16 ENCOUNTER — Encounter: Payer: Self-pay | Admitting: Emergency Medicine

## 2018-02-16 ENCOUNTER — Emergency Department: Payer: PPO

## 2018-02-16 ENCOUNTER — Other Ambulatory Visit: Payer: Self-pay

## 2018-02-16 ENCOUNTER — Inpatient Hospital Stay
Admission: EM | Admit: 2018-02-16 | Discharge: 2018-02-22 | DRG: 871 | Disposition: A | Discharge status: Home / Self Care | Payer: PPO | Attending: Internal Medicine | Admitting: Internal Medicine

## 2018-02-16 DIAGNOSIS — Z79899 Other long term (current) drug therapy: Secondary | ICD-10-CM

## 2018-02-16 DIAGNOSIS — N179 Acute kidney failure, unspecified: Secondary | ICD-10-CM | POA: Diagnosis not present

## 2018-02-16 DIAGNOSIS — Z87891 Personal history of nicotine dependence: Secondary | ICD-10-CM | POA: Diagnosis not present

## 2018-02-16 DIAGNOSIS — Z66 Do not resuscitate: Secondary | ICD-10-CM | POA: Diagnosis not present

## 2018-02-16 DIAGNOSIS — F419 Anxiety disorder, unspecified: Secondary | ICD-10-CM | POA: Diagnosis not present

## 2018-02-16 DIAGNOSIS — J40 Bronchitis, not specified as acute or chronic: Secondary | ICD-10-CM | POA: Diagnosis not present

## 2018-02-16 DIAGNOSIS — J9811 Atelectasis: Secondary | ICD-10-CM | POA: Diagnosis not present

## 2018-02-16 DIAGNOSIS — Z888 Allergy status to other drugs, medicaments and biological substances status: Secondary | ICD-10-CM | POA: Diagnosis not present

## 2018-02-16 DIAGNOSIS — R0602 Shortness of breath: Secondary | ICD-10-CM

## 2018-02-16 DIAGNOSIS — Z8249 Family history of ischemic heart disease and other diseases of the circulatory system: Secondary | ICD-10-CM | POA: Diagnosis not present

## 2018-02-16 DIAGNOSIS — Z825 Family history of asthma and other chronic lower respiratory diseases: Secondary | ICD-10-CM | POA: Diagnosis not present

## 2018-02-16 DIAGNOSIS — Z8673 Personal history of transient ischemic attack (TIA), and cerebral infarction without residual deficits: Secondary | ICD-10-CM | POA: Diagnosis not present

## 2018-02-16 DIAGNOSIS — J209 Acute bronchitis, unspecified: Secondary | ICD-10-CM | POA: Diagnosis present

## 2018-02-16 DIAGNOSIS — Z86711 Personal history of pulmonary embolism: Secondary | ICD-10-CM | POA: Diagnosis not present

## 2018-02-16 DIAGNOSIS — R6520 Severe sepsis without septic shock: Secondary | ICD-10-CM | POA: Diagnosis present

## 2018-02-16 DIAGNOSIS — J9601 Acute respiratory failure with hypoxia: Secondary | ICD-10-CM | POA: Diagnosis present

## 2018-02-16 DIAGNOSIS — E872 Acidosis: Secondary | ICD-10-CM | POA: Diagnosis not present

## 2018-02-16 DIAGNOSIS — E785 Hyperlipidemia, unspecified: Secondary | ICD-10-CM | POA: Diagnosis present

## 2018-02-16 DIAGNOSIS — H409 Unspecified glaucoma: Secondary | ICD-10-CM | POA: Diagnosis present

## 2018-02-16 DIAGNOSIS — R05 Cough: Secondary | ICD-10-CM

## 2018-02-16 DIAGNOSIS — E860 Dehydration: Secondary | ICD-10-CM | POA: Diagnosis not present

## 2018-02-16 DIAGNOSIS — B9781 Human metapneumovirus as the cause of diseases classified elsewhere: Secondary | ICD-10-CM | POA: Diagnosis not present

## 2018-02-16 DIAGNOSIS — F319 Bipolar disorder, unspecified: Secondary | ICD-10-CM | POA: Diagnosis present

## 2018-02-16 DIAGNOSIS — Z90710 Acquired absence of both cervix and uterus: Secondary | ICD-10-CM | POA: Diagnosis not present

## 2018-02-16 DIAGNOSIS — A419 Sepsis, unspecified organism: Principal | ICD-10-CM | POA: Diagnosis present

## 2018-02-16 DIAGNOSIS — R059 Cough, unspecified: Secondary | ICD-10-CM

## 2018-02-16 DIAGNOSIS — R000 Tachycardia, unspecified: Secondary | ICD-10-CM | POA: Diagnosis not present

## 2018-02-16 LAB — CBC
HEMATOCRIT: 46.4 % — AB (ref 36.0–46.0)
Hemoglobin: 15.2 g/dL — ABNORMAL HIGH (ref 12.0–15.0)
MCH: 29.8 pg (ref 26.0–34.0)
MCHC: 32.8 g/dL (ref 30.0–36.0)
MCV: 91 fL (ref 80.0–100.0)
NRBC: 0 % (ref 0.0–0.2)
PLATELETS: 205 10*3/uL (ref 150–400)
RBC: 5.1 MIL/uL (ref 3.87–5.11)
RDW: 14.7 % (ref 11.5–15.5)
WBC: 6.3 10*3/uL (ref 4.0–10.5)

## 2018-02-16 LAB — COMPREHENSIVE METABOLIC PANEL
ALBUMIN: 4.2 g/dL (ref 3.5–5.0)
ALT: 25 U/L (ref 0–44)
AST: 31 U/L (ref 15–41)
Alkaline Phosphatase: 88 U/L (ref 38–126)
Anion gap: 11 (ref 5–15)
BILIRUBIN TOTAL: 0.7 mg/dL (ref 0.3–1.2)
BUN: 14 mg/dL (ref 8–23)
CHLORIDE: 104 mmol/L (ref 98–111)
CO2: 23 mmol/L (ref 22–32)
CREATININE: 1.1 mg/dL — AB (ref 0.44–1.00)
Calcium: 9 mg/dL (ref 8.9–10.3)
GFR calc Af Amer: 58 mL/min — ABNORMAL LOW (ref >60)
GFR calc non Af Amer: 50 mL/min — ABNORMAL LOW (ref >60)
Glucose, Bld: 98 mg/dL (ref 70–99)
POTASSIUM: 3.9 mmol/L (ref 3.5–5.1)
Sodium: 138 mmol/L (ref 135–145)
Total Protein: 8.2 g/dL — ABNORMAL HIGH (ref 6.5–8.1)

## 2018-02-16 LAB — INFLUENZA PANEL BY PCR (TYPE A & B)
Influenza A By PCR: NEGATIVE
Influenza B By PCR: NEGATIVE

## 2018-02-16 LAB — TROPONIN I: Troponin I: <0.03 ng/mL (ref <0.03)

## 2018-02-16 LAB — LACTIC ACID, PLASMA: Lactic Acid, Venous: 3.6 mmol/L (ref 0.5–1.9)

## 2018-02-16 MED ORDER — METHYLPREDNISOLONE SODIUM SUCC 125 MG IJ SOLR
60.0000 mg | Freq: Two times a day (BID) | INTRAMUSCULAR | Status: DC
  Administered 2018-02-16 – 2018-02-22 (×12): 60 mg via INTRAVENOUS
  Filled 2018-02-16 (×12): qty 2

## 2018-02-16 MED ORDER — DOXYCYCLINE HYCLATE 100 MG PO TABS
100.0000 mg | ORAL_TABLET | Freq: Once | ORAL | Status: AC
  Administered 2018-02-16: 100 mg via ORAL
  Filled 2018-02-16: qty 1

## 2018-02-16 MED ORDER — SODIUM CHLORIDE 0.9 % IV BOLUS
500.0000 mL | Freq: Once | INTRAVENOUS | Status: AC
  Administered 2018-02-16: 18:00:00 500 mL via INTRAVENOUS

## 2018-02-16 MED ORDER — ACETAMINOPHEN 650 MG RE SUPP: 650.0000 mg | Freq: Four times a day (QID) | RECTAL | Status: DC | PRN

## 2018-02-16 MED ORDER — CLONAZEPAM 0.5 MG PO TABS
0.5000 mg | ORAL_TABLET | Freq: Three times a day (TID) | ORAL | Status: DC | PRN
  Administered 2018-02-17: 17:00:00 0.5 mg via ORAL
  Filled 2018-02-16: qty 1

## 2018-02-16 MED ORDER — ACETAMINOPHEN 325 MG PO TABS
650.0000 mg | ORAL_TABLET | Freq: Four times a day (QID) | ORAL | Status: DC | PRN
  Administered 2018-02-20: 650 mg via ORAL
  Filled 2018-02-16: qty 2

## 2018-02-16 MED ORDER — TEMAZEPAM 15 MG PO CAPS
15.0000 mg | ORAL_CAPSULE | Freq: Every day | ORAL | Status: DC
  Administered 2018-02-16 – 2018-02-21 (×6): 15 mg via ORAL
  Filled 2018-02-16 (×6): qty 1

## 2018-02-16 MED ORDER — ARIPIPRAZOLE 15 MG PO TABS
30.0000 mg | ORAL_TABLET | Freq: Every day | ORAL | Status: DC
  Administered 2018-02-17 – 2018-02-22 (×6): 30 mg via ORAL
  Filled 2018-02-16 (×6): qty 2

## 2018-02-16 MED ORDER — GUAIFENESIN 100 MG/5ML PO SOLN
10.0000 mL | Freq: Once | ORAL | Status: AC
  Administered 2018-02-16: 200 mg via ORAL
  Filled 2018-02-16 (×2): qty 10

## 2018-02-16 MED ORDER — IPRATROPIUM-ALBUTEROL 0.5-2.5 (3) MG/3ML IN SOLN
9.0000 mL | Freq: Once | RESPIRATORY_TRACT | Status: AC
  Administered 2018-02-16: 9 mL via RESPIRATORY_TRACT
  Filled 2018-02-16: qty 9

## 2018-02-16 MED ORDER — ONDANSETRON HCL 4 MG PO TABS: 4.0000 mg | ORAL_TABLET | Freq: Four times a day (QID) | ORAL | Status: DC | PRN

## 2018-02-16 MED ORDER — ONDANSETRON HCL 4 MG/2ML IJ SOLN
4.0000 mg | Freq: Four times a day (QID) | INTRAMUSCULAR | Status: DC | PRN
  Administered 2018-02-17: 4 mg via INTRAVENOUS
  Filled 2018-02-16: qty 2

## 2018-02-16 MED ORDER — ENOXAPARIN SODIUM 40 MG/0.4ML ~~LOC~~ SOLN
40.0000 mg | Freq: Two times a day (BID) | SUBCUTANEOUS | Status: DC
  Administered 2018-02-16 – 2018-02-22 (×12): 40 mg via SUBCUTANEOUS
  Filled 2018-02-16 (×12): qty 0.4

## 2018-02-16 MED ORDER — METHYLPREDNISOLONE SODIUM SUCC 125 MG IJ SOLR
125.0000 mg | Freq: Once | INTRAMUSCULAR | Status: AC
Start: 2018-02-16 — End: 2018-02-16
  Administered 2018-02-16: 125 mg via INTRAVENOUS
  Filled 2018-02-16: qty 2

## 2018-02-16 MED ORDER — SODIUM CHLORIDE 0.9% FLUSH
3.0000 mL | Freq: Two times a day (BID) | INTRAVENOUS | Status: DC
  Administered 2018-02-16 – 2018-02-22 (×12): 3 mL via INTRAVENOUS

## 2018-02-16 MED ORDER — GABAPENTIN 300 MG PO CAPS
400.0000 mg | ORAL_CAPSULE | Freq: Three times a day (TID) | ORAL | Status: DC
  Administered 2018-02-16 – 2018-02-22 (×17): 400 mg via ORAL
  Filled 2018-02-16 (×18): qty 1

## 2018-02-16 MED ORDER — BUPROPION HCL ER (SR) 150 MG PO TB12
150.0000 mg | ORAL_TABLET | Freq: Every day | ORAL | Status: DC
  Administered 2018-02-17 – 2018-02-22 (×6): 150 mg via ORAL
  Filled 2018-02-16 (×6): qty 1

## 2018-02-16 MED ORDER — CITALOPRAM HYDROBROMIDE 20 MG PO TABS
40.0000 mg | ORAL_TABLET | Freq: Every day | ORAL | Status: DC
Start: 2018-02-17 — End: 2018-02-22
  Administered 2018-02-17 – 2018-02-22 (×6): 40 mg via ORAL
  Filled 2018-02-16 (×6): qty 2

## 2018-02-16 MED ORDER — POLYETHYLENE GLYCOL 3350 17 G PO PACK: 17.0000 g | PACK | Freq: Every day | ORAL | Status: DC | PRN

## 2018-02-16 MED ORDER — ALBUTEROL SULFATE (2.5 MG/3ML) 0.083% IN NEBU
5.0000 mg | INHALATION_SOLUTION | Freq: Once | RESPIRATORY_TRACT | Status: AC
  Administered 2018-02-16: 5 mg via RESPIRATORY_TRACT
  Filled 2018-02-16: qty 6

## 2018-02-16 MED ORDER — LATANOPROST 0.005 % OP SOLN
1.0000 [drp] | Freq: Every day | OPHTHALMIC | Status: DC
  Administered 2018-02-16 – 2018-02-21 (×6): 1 [drp] via OPHTHALMIC
  Filled 2018-02-16: qty 2.5

## 2018-02-16 MED ORDER — ALBUTEROL SULFATE (2.5 MG/3ML) 0.083% IN NEBU: 2.5000 mg | INHALATION_SOLUTION | RESPIRATORY_TRACT | Status: DC | PRN

## 2018-02-16 NOTE — ED Provider Notes (Signed)
[facility] Emergency Department Provider Note  ____________________________________________   First MD Initiated Contact with Patient 02/16/18 1245     (approximate)  I have reviewed the triage vital signs and the nursing notes.   HISTORY  Chief Complaint Shortness of Breath; Wheezing; and Cough   HPI Gail Hardy is a 73 y.o. female with a history of depression, hyperlipidemia, pulmonary emboli, not currently on any blood thinners who is presented to the emergency department with 1 week of body aches as well as shortness of breath and a nonproductive cough.  Says this is the symptoms have worsened and this is what brought her to the emergency department today.  She does not wear home oxygen.  Denies smoking.  Says that she also has a baseline tremor which is secondary to chronic Abilify usage.   Past Medical History:  Diagnosis Date  . Anemia   . Aneurysm ([place]) 2011  . Blood transfusion without reported diagnosis 2013,2014   x 2  . Cancer ([place])   . Depression   . Family history of adverse reaction to anesthesia    daughter nausea and vomiting  . Glaucoma   . Hernia   . Hyperlipidemia   . Pinched nerve   . Pulmonary emboli ([place]) 2011  . Stroke [facility]) 2010    Patient Active Problem List   Diagnosis Date Noted  . Confusion 01/01/2017  . Pneumonia 01/01/2017  . History of calcium pyrophosphate deposition disease (CPPD) 10/08/2016  . Pseudogout involving multiple joints 10/08/2016  . Osteoarthritis 10/08/2016  . Lumbar facet hypertrophy 10/08/2016  . Spondylosis of lumbar spine 10/08/2016  . Chronic low back pain (Bilateral) 10/08/2016  . Chronic shoulder pain (Left) 10/08/2016  . Long term prescription benzodiazepine use 10/08/2016  . Long term prescription opiate use 10/08/2016  . Long term (current) use of opiate analgesic 10/08/2016  . Opiate use 10/08/2016  . Chronic knee pain (Left) 10/08/2016  . Osteoarthritis of knee (Left)  10/08/2016  . Chronic hip pain (Left) 10/08/2016  . Osteoarthritis of hip (Left) 10/08/2016  . Chronic pain syndrome 10/08/2016  . Disturbance of skin sensation 10/08/2016  . Cerebrovascular accident (CVA) ([place]) 03/15/2015  . Pressure ulcer 10/22/2014  . Stress fracture of hip with delayed healing 10/19/2014  . Allergic rhinitis 09/20/2014  . Anxiety disorder 09/20/2014  . Absolute anemia 09/20/2014  . Bipolar 1 disorder, depressed ([place]) 09/20/2014  . Body mass index of 60 or higher 09/20/2014  . Anemia due to blood loss 09/20/2014  . Aneurysm, cerebral 09/20/2014  . Chronic headache 09/20/2014  . Claustrophobia 09/20/2014  . Cerebral vascular accident ([place]) 09/20/2014  . Acute confusion due to medical condition 09/20/2014  . Clinical depression 09/20/2014  . 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 Diaphragmatic hernia 09/20/2014  . Gastric erosion 09/20/2014  . Enterogastritis 09/20/2014  . 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 Hypercholesteremia 09/20/2014  . Cannot sleep 09/20/2014  . Malaise and fatigue 09/20/2014  . Bad memory 09/20/2014  . Altered blood in stool 09/20/2014  . Candida glabrata infection 09/20/2014  . Decreased motor strength 09/20/2014  . Adiposity 09/20/2014  . Excess weight 09/20/2014  . Paralysis agitans ([place]) 09/20/2014  . Breakdown 09/20/2014  . PE (pulmonary embolism) 09/20/2014  . Hypercholesterolemia without hypertriglyceridemia 09/20/2014  . Restless leg 09/20/2014  . Neuralgia neuritis, sciatic nerve 09/20/2014  . Gastric ulcer 09/20/2014  . Fast heart beat 09/20/2014  . Temporary cerebral vascular dysfunction 09/20/2014  . Has a tremor 09/20/2014  . Adynamia 09/20/2014  .  Classical migraine with intractable migraine 08/10/2013  . Dysphagia, unspecified(787.20) 08/05/2012  . Paraesophageal hernia 08/04/2012    Past Surgical History:  Procedure Laterality Date  . ABDOMINAL HYSTERECTOMY    . BREAST EXCISIONAL BIOPSY Right    pt  states she had "lumps removed from breast as a teen"  . CEREBRAL ANEURYSM REPAIR  2011   stent and coil insertion  . CHOLECYSTECTOMY    . COLONOSCOPY  2013   [facility]har  . HERNIA REPAIR  2014   lap paraesophageal repair  . INTRAMEDULLARY (IM) NAIL INTERTROCHANTERIC Left 10/19/2014   Procedure: INTRAMEDULLARY (IM) NAIL INTERTROCHANTRIC;  Surgeon: Earnestine Leys, MD;  Location: [facility] ORS;  Service: Orthopedics;  Laterality: Left;  . UPPER GI ENDOSCOPY  2013, 2014   [name]    Prior to Admission medications   Medication Sig Start Date End Date Taking? Authorizing Provider  ARIPiprazole (ABILIFY) 30 MG tablet TAKE 1 TABLET BY MOUTH ONCE A DAY 11/03/17  Yes Jerrol Banana., MD  buPROPion [facility] SR) 150 MG 12 hr tablet TAKE 1 TABLET BY MOUTH ONCE A DAY 09/04/17  Yes Jerrol Banana., MD  citalopram (CELEXA) 40 MG tablet Take 1 tablet (40 mg total) by mouth daily. 01/05/17  Yes Gouru, Aruna, MD  clonazePAM (KLONOPIN) 0.5 MG tablet TAKE 1/2 TABLET (0.25MG  TOTAL) BY MOUTH EVERY 8 HOURS AS NEEDED FOR ANXIETY. 11/10/17  Yes Jerrol Banana., MD  gabapentin (NEURONTIN) 400 MG capsule TAKE 1 CAPSULE BY MOUTH 3 TIMES DAILY 12/25/17  Yes Jerrol Banana., MD  latanoprost (XALATAN) 0.005 % ophthalmic solution Place 1 drop into both eyes at bedtime.  08/01/12  Yes [provider]  temazepam (RESTORIL) 15 MG capsule TAKE 1 CAPSULE BY MOUTH ONCE DAILY 02/12/18  Yes Jerrol Banana., MD    Allergies Diazepam  Family History  Problem Relation Age of Onset  . Emphysema Father   . COPD Mother   . Heart disease Mother   . Heart disease Brother   . Dementia Brother   . Liver disease Brother   . Heart disease Brother     Social History Social History   Tobacco Use  . Smoking status: Former Smoker    Years: 10.00    Types: Cigarettes    Last attempt to quit: 02/10/1989    Years since quitting: 29.0  . Smokeless tobacco: Never Used  Substance Use Topics  .  Alcohol use: No  . Drug use: No    Review of Systems  Constitutional: Positive for chills in the past 24 hours. Eyes: No visual changes. ENT: No sore throat. Cardiovascular: Denies chest pain. Respiratory: As above Gastrointestinal: No abdominal pain.  No nausea, no vomiting.  No diarrhea.  No constipation. Genitourinary: Negative for dysuria. Musculoskeletal: Negative for back pain. Skin: Negative for rash. Neurological: Negative for headaches, focal weakness or numbness.   ____________________________________________   PHYSICAL EXAM:  VITAL SIGNS: ED Triage Vitals  Enc Vitals Group     BP 02/16/18 1117 116/65     Pulse Rate 02/16/18 1116 (!) 117     Resp 02/16/18 1116 (!) 22     Temp 02/16/18 1116 99.4 F (37.4 C)     Temp Source 02/16/18 1116 Oral     SpO2 02/16/18 1116 93 %     Weight 02/16/18 1117 220 lb (99.8 kg)     Height 02/16/18 1117 5\' 1"  (1.549 m)     Head Circumference --  Peak Flow --      Pain Score 02/16/18 1117 0     Pain Loc --      Pain Edu? --      Excl. in [place]? --     Constitutional: Alert and oriented.  No acute distress.  Able speak in full sentences. Eyes: Conjunctivae are normal.  Head: Atraumatic. Nose: No congestion/rhinnorhea. Mouth/Throat: Mucous membranes are moist.  Neck: No stridor.   Cardiovascular: Tachycardic with a regular rhythm.  Reports a chronic tachycardia.  However, does not know the exact rate.  Has already had 1 breathing treatment prior to my evaluation today.  Grossly normal heart sounds.  Respiratory: Tachypneic with supraclavicular retractions.  Diffuse wheezing with decreased air movement throughout. Gastrointestinal: Soft and nontender. No distention.  Musculoskeletal: No lower extremity tenderness nor edema.  No joint effusions. Neurologic:  Normal speech and language. No gross focal neurologic deficits are appreciated. Skin:  Skin is warm, dry and intact. No rash noted. Psychiatric: Mood and affect are  normal. Speech and behavior are normal.  ____________________________________________   LABS (all labs ordered are listed, but only abnormal results are displayed)  Labs Reviewed  CBC - Abnormal; Notable for the following components:      Result Value   Hemoglobin 15.2 (*)    HCT 46.4 (*)    All other components within normal limits  COMPREHENSIVE METABOLIC PANEL - Abnormal; Notable for the following components:   Creatinine, Ser 1.10 (*)    Total Protein 8.2 (*)    GFR calc non Af Amer 50 (*)    GFR calc Af Amer 58 (*)    All other components within normal limits  TROPONIN I  INFLUENZA PANEL BY PCR (TYPE A & B)   ____________________________________________  EKG  ED ECG REPORT I, Doran Stabler, the attending physician, personally viewed and interpreted this ECG.   Date: 02/16/2018  EKG Time: 1114  Rate: 119  Rhythm: sinus tachycardia  Axis: Normal  Intervals:none  ST&T Change: No ST segment elevation or depression.  No abnormal T wave inversion.  ____________________________________________  RADIOLOGY  No acute abnormality on chest x-ray ____________________________________________   PROCEDURES  Procedure(s) performed:   Procedures  Critical Care performed:   ____________________________________________   INITIAL IMPRESSION / ASSESSMENT AND PLAN / ED COURSE  Pertinent labs & imaging results that were available during my care of the patient were reviewed by me and considered in my medical decision making (see chart for details).  Differential includes, but is not limited to, viral syndrome, bronchitis including COPD exacerbation, pneumonia, reactive airway disease including asthma, CHF including exacerbation with or without pulmonary/interstitial edema, pneumothorax, ACS, thoracic trauma, and pulmonary embolism. As part of my medical decision making, I reviewed the following data within the [place] prior office  visits  ----------------------------------------- 2:32 PM on 02/16/2018 -----------------------------------------  Reassessed the patient and she is still wheezing and says that she does not feels subjectively improved.  She will be admitted to the hospital for continued treatment for what appears to be bronchitis.  Patient was flu negative.  Tachycardia likely related to patient's use of beta agonist.  No reports of chest pain and wheezing makes the diagnosis of reactive airway disease much more likely than pulmonary embolus.  Patient aware of need for admission to the hospital.  Signed out to Dr. Darvin Neighbours. ____________________________________________   FINAL CLINICAL IMPRESSION(S) / ED DIAGNOSES  Bronchitis.   NEW MEDICATIONS STARTED DURING THIS VISIT:  New Prescriptions  No medications on file     Note:  This document was prepared using Dragon voice recognition software and may include unintentional dictation errors.     Orbie Pyo, MD 02/16/18 [phone]

## 2018-02-16 NOTE — Progress Notes (Signed)
Advance care planning  Purpose of Encounter Acute bronchitis  Parties in Attendance Patient, husband at bedside - Gail Hardy,Gail Hardy(HCPOA)  Patients Decisional capacity Alert and oriented.  Able to make medical decisions.  Patient has a documented healthcare power of attorney and advanced directives.  Discussed with patient with husband at bedside regarding intubation/ventilator/CPR and defibrillation.  Patient tells me she would like to be DO NOT RESUSCITATE DO NOT INTUBATE.  Orders entered.  CODE STATUS changed in the computer  Time spent - 17 minutes

## 2018-02-16 NOTE — ED Triage Notes (Signed)
Wheezing and coughing in triage, tight cough noted.

## 2018-02-16 NOTE — ED Notes (Signed)
Breathing tx initiated in triage 3.

## 2018-02-16 NOTE — ED Notes (Signed)
Patient ambulatory to restroom with standby assistance. Patient with audible wheezing and increased work of breathing with exertion. When placed on monitor after returning to bed, patient oxygen saturation 89%. Patient placed on 2 L [place]. MD aware.

## 2018-02-16 NOTE — ED Triage Notes (Signed)
Pt presents with shob, cough, wheezing for the past 4 days, low grade fever, has been taking OTC meds with no relief, states she is weak from coughing, NAD.

## 2018-02-16 NOTE — H&P (Signed)
[facility] [place] NAME: Gail Hardy    MR#:  [id number]  DATE OF BIRTH:  September 08, 1945  DATE OF ADMISSION:  02/16/2018  PRIMARY CARE PHYSICIAN: Jerrol Banana., MD   REQUESTING/REFERRING PHYSICIAN: Dr. Quentin Cornwall  CHIEF COMPLAINT:   Chief Complaint  Patient presents with  . Shortness of Breath  . Wheezing  . Cough    HISTORY OF PRESENT ILLNESS:  Gail Hardy  is a 73 y.o. female with a known history of anxiety, PE presents to the emergency room complaining of worsening shortness of breath of 4 days with cough, congestion and wheezing.  Minimal sputum production.  Temperature 99.4 in the emergency room.  In spite of multiple nebulizer treatments and steroids.  Continues to have shortness of breath, wheezing.  Chest x-ray clear.  Oxygen saturations 90% on room air at rest. Sinus tachycardia into the 120s. Influenza test negative.  No orthopnea or lower extremity edema.  PAST MEDICAL HISTORY:   Past Medical History:  Diagnosis Date  . Anemia   . Aneurysm ([place]) 2011  . Blood transfusion without reported diagnosis 2013,2014   x 2  . Cancer ([place])   . Depression   . Family history of adverse reaction to anesthesia    daughter nausea and vomiting  . Glaucoma   . Hernia   . Hyperlipidemia   . Pinched nerve   . Pulmonary emboli ([place]) 2011  . Stroke [facility]) 2010    PAST SURGICAL HISTORY:   Past Surgical History:  Procedure Laterality Date  . ABDOMINAL HYSTERECTOMY    . BREAST EXCISIONAL BIOPSY Right    pt states she had "lumps removed from breast as a teen"  . CEREBRAL ANEURYSM REPAIR  2011   stent and coil insertion  . CHOLECYSTECTOMY    . COLONOSCOPY  2013   [facility]  . HERNIA REPAIR  2014   lap paraesophageal repair  . INTRAMEDULLARY (IM) NAIL INTERTROCHANTERIC Left 10/19/2014   Procedure: INTRAMEDULLARY (IM) NAIL INTERTROCHANTRIC;  Surgeon: Earnestine Leys, MD;  Location: [facility] ORS;  Service: Orthopedics;  Laterality:  Left;  . UPPER GI ENDOSCOPY  2013, 2014   [place]    SOCIAL HISTORY:   Social History   Tobacco Use  . Smoking status: Former Smoker    Years: 10.00    Types: Cigarettes    Last attempt to quit: 02/10/1989    Years since quitting: 29.0  . Smokeless tobacco: Never Used  Substance Use Topics  . Alcohol use: No    FAMILY HISTORY:   Family History  Problem Relation Age of Onset  . Emphysema Father   . COPD Mother   . Heart disease Mother   . Heart disease Brother   . Dementia Brother   . Liver disease Brother   . Heart disease Brother     DRUG ALLERGIES:   Allergies  Allergen Reactions  . Diazepam     Other reaction(s): Feeling agitated (finding)    REVIEW OF SYSTEMS:   Review of Systems  Constitutional: Positive for malaise/fatigue. Negative for chills and fever.  HENT: Negative for sore throat.   Eyes: Negative for blurred vision, double vision and pain.  Respiratory: Positive for cough, shortness of breath and wheezing. Negative for hemoptysis.   Cardiovascular: Negative for chest pain, palpitations, orthopnea and leg swelling.  Gastrointestinal: Negative for abdominal pain, constipation, diarrhea, heartburn, nausea and vomiting.  Genitourinary: Negative for dysuria and hematuria.  Musculoskeletal: Negative for back pain and joint pain.  Skin: Negative for rash.  Neurological: Negative for sensory change, speech change, focal weakness and headaches.  Endo/Heme/Allergies: Does not bruise/bleed easily.  Psychiatric/Behavioral: Negative for depression. The patient is not nervous/anxious.     MEDICATIONS AT HOME:   Prior to Admission medications   Medication Sig Start Date End Date Taking? Authorizing Provider  ARIPiprazole (ABILIFY) 30 MG tablet TAKE 1 TABLET BY MOUTH ONCE A DAY 11/03/17  Yes Jerrol Banana., MD  [facility] SR) 150 MG 12 hr tablet TAKE 1 TABLET BY MOUTH ONCE A DAY 09/04/17  Yes Jerrol Banana., MD  citalopram (CELEXA)  40 MG tablet Take 1 tablet (40 mg total) by mouth daily. 01/05/17  Yes Gouru, Aruna, MD  clonazePAM (KLONOPIN) 0.5 MG tablet TAKE 1/2 TABLET (0.25MG  TOTAL) BY MOUTH EVERY 8 HOURS AS NEEDED FOR ANXIETY. 11/10/17  Yes Jerrol Banana., MD  gabapentin (NEURONTIN) 400 MG capsule TAKE 1 CAPSULE BY MOUTH 3 TIMES DAILY 12/25/17  Yes Jerrol Banana., MD  latanoprost (XALATAN) 0.005 % ophthalmic solution Place 1 drop into both eyes at bedtime.  08/01/12  Yes [provider]  temazepam (RESTORIL) 15 MG capsule TAKE 1 CAPSULE BY MOUTH ONCE DAILY 02/12/18  Yes Jerrol Banana., MD     VITAL SIGNS:  Blood pressure 108/90, pulse (!) 128, temperature 99.4 F (37.4 C), temperature source Oral, resp. rate (!) 24, height 5\' 1"  (1.549 m), weight 99.8 kg, SpO2 90 %.  PHYSICAL EXAMINATION:  Physical Exam  GENERAL:  73 y.o.-year-old patient lying in the bed with significant conversational dyspnea. Obese EYES: Pupils equal, round, reactive to light and accommodation. No scleral icterus. Extraocular muscles intact.  HEENT: Head atraumatic, normocephalic. Oropharynx and nasopharynx clear. No oropharyngeal erythema, moist oral mucosa  NECK:  Supple, no jugular venous distention. No thyroid enlargement, no tenderness.  LUNGS: Increased work of breathing.  Bilateral wheezing.  Decreased air entry. CARDIOVASCULAR: S1, S2 normal. No murmurs, rubs, or gallops.  ABDOMEN: Soft, nontender, nondistended. Bowel sounds present. No organomegaly or mass.  EXTREMITIES: No pedal edema, cyanosis, or clubbing. + 2 pedal & radial pulses b/l.   NEUROLOGIC: Cranial nerves II through XII are intact. No focal Motor or sensory deficits appreciated b/l PSYCHIATRIC: The patient is alert and oriented x 3. Good affect.  SKIN: No obvious rash, lesion, or ulcer.   LABORATORY PANEL:   CBC Recent Labs  Lab 02/16/18 1130  WBC 6.3  HGB 15.2*  HCT 46.4*  PLT 205    ------------------------------------------------------------------------------------------------------------------  Chemistries  Recent Labs  Lab 02/16/18 1130  NA 138  K 3.9  CL 104  CO2 23  GLUCOSE 98  BUN 14  CREATININE 1.10*  CALCIUM 9.0  AST 31  ALT 25  ALKPHOS 88  BILITOT 0.7   ------------------------------------------------------------------------------------------------------------------  Cardiac Enzymes Recent Labs  Lab 02/16/18 1130  TROPONINI <0.03   ------------------------------------------------------------------------------------------------------------------  RADIOLOGY:  Dg Chest 2 View  Result Date: 02/16/2018 : No acute abnormality seen. Electronically Signed   By: Inez Catalina M.D.   On: 02/16/2018 12:02   IMPRESSION AND PLAN:   *Acute bronchitis with sepsis present on admission.  Patient has tachypnea and tachycardia.  Low-grade temperature 99.3.  Will check lactic acid.  Fluid bolus now. ..-IV steroids - Scheduled Nebulizers - Inhalers -Wean O2 as tolerated - Consult pulmonary if no improvement Influenza negative.  Chest x-ray clear.  *Sinus tachycardia secondary to sepsis and a breathing treatment.  *Anxiety.  Continue home medications.  Klonopin  ordered.  *DVT prophylaxis with Lovenox  All the records are reviewed and case discussed with ED provider. Management plans discussed with the patient, family and they are in agreement.  CODE STATUS: DO NOT RESUSCITATE and DO NOT INTUBATE  TOTAL TIME TAKING CARE OF THIS PATIENT: 40 minutes.   Leia Alf Roberth Berling M.D on 02/16/2018 at 2:58 PM  Between 7am to 6pm - Pager - [phone]  After 6pm go to [email or web address] - password [email or web address] [facility] [facility]  Office  [phone]  CC: Primary care physician; Jerrol Banana., MD  Note: This dictation was prepared with Dragon dictation along with smaller phrase technology. Any transcriptional errors that result from this  process are unintentional.

## 2018-02-16 NOTE — ED Notes (Signed)
Pt transported to room 114

## 2018-02-16 NOTE — Progress Notes (Signed)
PHARMACIST - PHYSICIAN COMMUNICATION  CONCERNING:  Enoxaparin (Lovenox) for DVT Prophylaxis    RECOMMENDATION: Patient was prescribed enoxaprin 40mg  q24 hours for VTE prophylaxis.   Filed Weights   02/16/18 1117  Weight: 220 lb (99.8 kg)    Body mass index is 41.57 kg/m.  Estimated Creatinine Clearance: 50.1 mL/min (A) (by C-G formula based on SCr of 1.1 mg/dL (H)).   Based on [place] patient is candidate for enoxaparin 40mg  every 12 hour dosing due to BMI being >40.  DESCRIPTION: Pharmacy has adjusted enoxaparin dose per [facility] policy.  Patient is now receiving enoxaparin 40mg  every 12 hours.    Oswald Hillock, PharmD, BCPS Clinical Pharmacist  02/16/2018 3:08 PM

## 2018-02-17 ENCOUNTER — Inpatient Hospital Stay: Payer: PPO

## 2018-02-17 LAB — LACTIC ACID, PLASMA
Lactic Acid, Venous: 6.1 mmol/L (ref 0.5–1.9)
Lactic Acid, Venous: 3.1 mmol/L (ref 0.5–1.9)

## 2018-02-17 LAB — URINALYSIS, COMPLETE (UACMP) WITH MICROSCOPIC
Bacteria, UA: NONE SEEN
Bilirubin Urine: NEGATIVE
Glucose, UA: NEGATIVE mg/dL
Hgb urine dipstick: NEGATIVE
Ketones, ur: NEGATIVE mg/dL
Nitrite: NEGATIVE
Protein, ur: NEGATIVE mg/dL
Specific Gravity, Urine: 1.026 (ref 1.005–1.030)
pH: 5 (ref 5.0–8.0)

## 2018-02-17 LAB — BASIC METABOLIC PANEL
Anion gap: 11 (ref 5–15)
BUN: 27 mg/dL — AB (ref 8–23)
CALCIUM: 8.6 mg/dL — AB (ref 8.9–10.3)
CO2: 22 mmol/L (ref 22–32)
Chloride: 106 mmol/L (ref 98–111)
Creatinine, Ser: 1.27 mg/dL — ABNORMAL HIGH (ref 0.44–1.00)
GFR calc non Af Amer: 42 mL/min — ABNORMAL LOW (ref >60)
GFR, EST AFRICAN AMERICAN: 49 mL/min — AB (ref >60)
Glucose, Bld: 152 mg/dL — ABNORMAL HIGH (ref 70–99)
Potassium: 3.9 mmol/L (ref 3.5–5.1)
Sodium: 139 mmol/L (ref 135–145)

## 2018-02-17 LAB — CBC
HCT: 41.8 % (ref 36.0–46.0)
Hemoglobin: 13.4 g/dL (ref 12.0–15.0)
MCH: 29.9 pg (ref 26.0–34.0)
MCHC: 32.1 g/dL (ref 30.0–36.0)
MCV: 93.3 fL (ref 80.0–100.0)
Platelets: 189 10*3/uL (ref 150–400)
RBC: 4.48 MIL/uL (ref 3.87–5.11)
RDW: 14.7 % (ref 11.5–15.5)
WBC: 5.9 10*3/uL (ref 4.0–10.5)
nRBC: 0 % (ref 0.0–0.2)

## 2018-02-17 LAB — PROCALCITONIN: Procalcitonin: <0.1 ng/mL

## 2018-02-17 LAB — TSH: TSH: 0.157 u[IU]/mL — AB (ref 0.350–4.500)

## 2018-02-17 MED ORDER — LEVALBUTEROL HCL 1.25 MG/0.5ML IN NEBU: 1.2500 mg | INHALATION_SOLUTION | Freq: Four times a day (QID) | RESPIRATORY_TRACT | Status: DC

## 2018-02-17 MED ORDER — IPRATROPIUM-ALBUTEROL 0.5-2.5 (3) MG/3ML IN SOLN
3.0000 mL | Freq: Four times a day (QID) | RESPIRATORY_TRACT | Status: DC
  Administered 2018-02-17 (×2): 3 mL via RESPIRATORY_TRACT
  Filled 2018-02-17 (×2): qty 3

## 2018-02-17 MED ORDER — SODIUM CHLORIDE 0.9 % IV SOLN
INTRAVENOUS | Status: AC
  Administered 2018-02-17: 17:00:00 via INTRAVENOUS

## 2018-02-17 MED ORDER — GUAIFENESIN-DM 100-10 MG/5ML PO SYRP
5.0000 mL | ORAL_SOLUTION | ORAL | Status: DC | PRN
  Administered 2018-02-17: 06:00:00 5 mL via ORAL
  Filled 2018-02-17 (×2): qty 5

## 2018-02-17 MED ORDER — LEVALBUTEROL HCL 1.25 MG/0.5ML IN NEBU
1.2500 mg | INHALATION_SOLUTION | Freq: Four times a day (QID) | RESPIRATORY_TRACT | Status: DC
  Administered 2018-02-17 – 2018-02-22 (×19): 1.25 mg via RESPIRATORY_TRACT
  Filled 2018-02-17 (×19): qty 0.5

## 2018-02-17 MED ORDER — BENZONATATE 100 MG PO CAPS
200.0000 mg | ORAL_CAPSULE | Freq: Three times a day (TID) | ORAL | Status: DC | PRN
  Administered 2018-02-17 – 2018-02-20 (×7): 200 mg via ORAL
  Filled 2018-02-17 (×7): qty 2

## 2018-02-17 MED ORDER — GUAIFENESIN-CODEINE 100-10 MG/5ML PO SOLN
5.0000 mL | ORAL | Status: DC | PRN
  Administered 2018-02-17 – 2018-02-21 (×12): 5 mL via ORAL
  Filled 2018-02-17 (×14): qty 5

## 2018-02-17 NOTE — Progress Notes (Signed)
[facility] [place] NAME: Gail Hardy    MR#:  [id number]  DATE OF BIRTH:  07/11/1945  SUBJECTIVE:   Patient endorses continued severe cough.  She feels like the cough medicine is helping, but just for a short period of time.  She remains on 2 L O2.  She endorses some mild shortness of breath.  REVIEW OF SYSTEMS:  Review of Systems  Constitutional: Negative for chills and fever.  HENT: Negative for congestion and sore throat.   Eyes: Negative for blurred vision and double vision.  Respiratory: Positive for cough, sputum production, shortness of breath and wheezing.   Cardiovascular: Negative for chest pain, palpitations and leg swelling.  Gastrointestinal: Negative for nausea and vomiting.  Genitourinary: Negative for dysuria and frequency.  Musculoskeletal: Negative for back pain and neck pain.  Neurological: Negative for dizziness and headaches.  Psychiatric/Behavioral: Negative for depression and suicidal ideas. The patient is not nervous/anxious.     DRUG ALLERGIES:   Allergies  Allergen Reactions  . Diazepam     Other reaction(s): Feeling agitated (finding)   VITALS:  Blood pressure 121/80, pulse (!) 102, temperature 98.2 F (36.8 C), temperature source Oral, resp. rate 18, height 5\' 1"  (1.549 m), weight 99.7 kg, SpO2 97 %. PHYSICAL EXAMINATION:  Physical Exam  GENERAL:  73 y.o.-year-old patient lying in the bed, in NAD. Coughing very frequently. HEENT: Head atraumatic, normocephalic. Oropharynx and nasopharynx clear. No oropharyngeal erythema, moist oral mucosa . Pupils equal, round, reactive to light and accommodation. No scleral icterus. Extraocular muscles intact.  NECK:  Supple, no jugular venous distention. No thyroid enlargement, no tenderness.  LUNGS: Increased work of breathing.  Bilateral wheezing.  Decreased air entry. CARDIOVASCULAR: RRR, S1, S2 normal. No murmurs, rubs, or gallops.  ABDOMEN: Soft, nontender,  nondistended. Bowel sounds present. No organomegaly or mass.  EXTREMITIES: No pedal edema, cyanosis, or clubbing. + 2 pedal & radial pulses b/l.   NEUROLOGIC: Cranial nerves II through XII are intact. No focal Motor or sensory deficits appreciated b/l PSYCHIATRIC: The patient is alert and oriented x 3. Good affect.  SKIN: No obvious rash, lesion, or ulcer.  LABORATORY PANEL:  Female CBC Recent Labs  Lab 02/17/18 0344  WBC 5.9  HGB 13.4  HCT 41.8  PLT 189   ------------------------------------------------------------------------------------------------------------------ Chemistries  Recent Labs  Lab 02/16/18 1130 02/17/18 0344  NA 138 139  K 3.9 3.9  CL 104 106  CO2 23 22  GLUCOSE 98 152*  BUN 14 27*  CREATININE 1.10* 1.27*  CALCIUM 9.0 8.6*  AST 31  --   ALT 25  --   ALKPHOS 88  --   BILITOT 0.7  --    RADIOLOGY:  Dg Chest 2 View  Result Date: 02/17/2018 CLINICAL DATA:  Cough EXAM: CHEST - 2 VIEW COMPARISON:  02/16/2017 FINDINGS: Cardiac shadow is within normal limits. The lungs are well aerated bilaterally. Postsurgical changes at the gastroesophageal junction are noted. No acute bony abnormality is seen. IMPRESSION: No acute abnormality noted. Electronically Signed   By: Inez Catalina M.D.   On: 02/17/2018 09:56   ASSESSMENT AND PLAN:   Sepsis- patient meeting sepsis criteria on admission with tachypnea, tachycardia, and low grade temperature. Lactic acid elevated to 6.1. Sepsis resolving, but sepsis work-up has not been performed. -Will check UA, urine culture, and blood culture -Hold on antibiotics for now.  Acute bronchitis- remains on 2L O2.  Influenza negative. -Continue IV steroids -Continue scheduled nebulizers -  Wean O2 as tolerated -Repeat chest x-ray today -Check procalcitonin -Check RVP  Lactic acidosis- likely related to above. Lactic acid 6.1 on admission. -Will recheck lactic acid today  Sinus tachycardia- likely secondary to sepsis and  albuterol -Change albuterol nebs to xopenex -Will start on gentle fluids -Check TSH  AKI- Cr 1.27 today (baseline 0.6-0.8).  Likely secondary to dehydration and sepsis. -Will start on gentle fluids -Recheck creatinine in the morning  Anxiety/Bipolar disorder- stable -Continue home medications.   -Klonopin ordered.  Plan for likely discharge home tomorrow.  All the records are reviewed and case discussed with Care Management/Social Worker. Management plans discussed with the patient, family and they are in agreement.  CODE STATUS: DNR  TOTAL TIME TAKING CARE OF THIS PATIENT: 40 minutes.   More than 50% of the time was spent in counseling/coordination of care: YES  POSSIBLE D/C tomorrow, DEPENDING ON CLINICAL CONDITION.   Berna Spare Nneka Blanda M.D on 02/17/2018 at 3:10 PM  Between 7am to 6pm - Pager - [phone]  After 6pm go to [email or web address] - password [email or web address] [facility]  [facility]  Office  [phone]  CC: Primary care physician; Jerrol Banana., MD  Note: This dictation was prepared with Dragon dictation along with smaller phrase technology. Any transcriptional errors that result from this process are unintentional.

## 2018-02-17 NOTE — Care Management Note (Signed)
Case Management Note  Patient Details  Name: Gail Hardy MRN: [id number] Date of Birth: 1945-10-01  Subjective/Objective:                 From home.  Admitted with bronchitis and sepsis. negative for flu. Oxygen is acute.  Current with pcp. .Independent in all adls, denies issues accessing medical care, obtaining medications or with transportation.  o discharge needs identified at present by care manager or members of care team. .  Action/Plan:  Home oxygen assessment prior to discharge  Expected Discharge Date:                  Expected Discharge Plan:     In-House Referral:     Discharge planning Services     Post Acute Care Choice:    Choice offered to:     DME Arranged:    DME Agency:     HH Arranged:    HH Agency:     Status of Service:     If discussed at [employer], dates discussed:    Additional Comments:  Katrina Stack, RN 02/17/2018, 3:26 PM

## 2018-02-18 LAB — RESPIRATORY PANEL BY PCR
Adenovirus: NOT DETECTED
Bordetella pertussis: NOT DETECTED
Chlamydophila pneumoniae: NOT DETECTED
Coronavirus 229E: NOT DETECTED
Coronavirus HKU1: NOT DETECTED
Coronavirus NL63: NOT DETECTED
Coronavirus OC43: NOT DETECTED
INFLUENZA A-RVPPCR: NOT DETECTED
Influenza B: NOT DETECTED
Metapneumovirus: DETECTED — AB
Mycoplasma pneumoniae: NOT DETECTED
PARAINFLUENZA VIRUS 2-RVPPCR: NOT DETECTED
Parainfluenza Virus 1: NOT DETECTED
Parainfluenza Virus 3: NOT DETECTED
Parainfluenza Virus 4: NOT DETECTED
Respiratory Syncytial Virus: NOT DETECTED
Rhinovirus / Enterovirus: NOT DETECTED

## 2018-02-18 LAB — CBC
HCT: 41 % (ref 36.0–46.0)
Hemoglobin: 13.2 g/dL (ref 12.0–15.0)
MCH: 29.6 pg (ref 26.0–34.0)
MCHC: 32.2 g/dL (ref 30.0–36.0)
MCV: 91.9 fL (ref 80.0–100.0)
Platelets: 210 10*3/uL (ref 150–400)
RBC: 4.46 MIL/uL (ref 3.87–5.11)
RDW: 14.8 % (ref 11.5–15.5)
WBC: 12.3 10*3/uL — ABNORMAL HIGH (ref 4.0–10.5)
nRBC: 0 % (ref 0.0–0.2)

## 2018-02-18 LAB — BASIC METABOLIC PANEL
Anion gap: 8 (ref 5–15)
BUN: 31 mg/dL — ABNORMAL HIGH (ref 8–23)
CO2: 22 mmol/L (ref 22–32)
Calcium: 8.9 mg/dL (ref 8.9–10.3)
Chloride: 109 mmol/L (ref 98–111)
Creatinine, Ser: 0.83 mg/dL (ref 0.44–1.00)
GFR calc Af Amer: >60 mL/min (ref >60)
GFR calc non Af Amer: >60 mL/min (ref >60)
Glucose, Bld: 138 mg/dL — ABNORMAL HIGH (ref 70–99)
Potassium: 4.3 mmol/L (ref 3.5–5.1)
SODIUM: 139 mmol/L (ref 135–145)

## 2018-02-18 NOTE — Evaluation (Signed)
Physical Therapy Evaluation Patient Details Name: Gail Hardy MRN: [id number] DOB: [birth year] Today's Date: 02/18/2018   History of Present Illness  presented to ER seconadry to cough, congestion and SOB x4 days; admitted with sepsis related to acute bronchitis.  Clinical Impression  Upon evaluation, patient alert and oriented; follows commands and demonstrates good effort with mobility tasks. Bilat UE/LE strength and ROM grossly symmetrical and WFL for basic transfers and gait; generally tremulous (patient relates as a medication side effect), diminishes with intentional movement and WBing. Able to complete bed mobility with min assist; sit/stand, basic transfers and gait (85') with RW, cga/min assist.  Broad BOS with short, choppy steps; recommend continued use of RW at all times.  Patient voiced understanding.  BORG 8/10 after minimal gait distance; sats >93% on RA.  Will continue to assess/progress mobility as appropriate. Would benefit from skilled PT to address above deficits and promote optimal return to PLOF; Recommend transition to [place] upon discharge from acute hospitalization.     Follow Up Recommendations Home health PT    Equipment Recommendations       Recommendations for Other Services       Precautions / Restrictions Precautions Precautions: Fall Precaution Comments: droplet iso Restrictions Weight Bearing Restrictions: No      Mobility  Bed Mobility Overal bed mobility: Needs Assistance Bed Mobility: Supine to Sit     Supine to sit: Min assist        Transfers Overall transfer level: Needs assistance Equipment used: Rolling walker (2 wheeled) Transfers: Sit to/from Stand Sit to Stand: Min assist;Min guard         General transfer comment: cuing for hand placement  Ambulation/Gait Ambulation/Gait assistance: Min guard Gait Distance (Feet): 15 Feet Assistive device: Rolling walker (2 wheeled)       General Gait Details: forward flexed  posture, decreased cadence, gait speed; mod SOB with minimal gait distance (BORG 8/10)  Stairs            Wheelchair Mobility    Modified Rankin (Stroke Patients Only)       Balance Overall balance assessment: Needs assistance Sitting-balance support: No upper extremity supported;Feet supported Sitting balance-Leahy Scale: Good     Standing balance support: Bilateral upper extremity supported Standing balance-Leahy Scale: Fair                               Pertinent Vitals/Pain Pain Assessment: No/denies pain    Home Living Family/patient expects to be discharged to:: Private residence Living Arrangements: Spouse/significant other Available Help at Discharge: Family;Available 24 hours/day Type of Home: House Home Access: Stairs to enter Entrance Stairs-Rails: Psychiatric nurse of Steps: 3 Home Layout: One level        Prior Function Level of Independence: Independent with assistive device(s)         Comments: Mod indep for household distances (intermittent furniture grabbing), use of RW for limited community distances; husband assists with ADLs, household responsibilities as needed     Hand Dominance   Dominant Hand: Right    Extremity/Trunk Assessment   Upper Extremity Assessment Upper Extremity Assessment: Overall WFL for tasks assessed    Lower Extremity Assessment Lower Extremity Assessment: Overall WFL for tasks assessed(grossly at least 4-/5 throughout)       Communication   Communication: No difficulties  Cognition Arousal/Alertness: Awake/alert Behavior During Therapy: WFL for tasks assessed/performed Overall Cognitive Status: Within Functional Limits for tasks assessed  General Comments      Exercises Other Exercises Other Exercises: Toilet transfer, SPT with RW, cga/min assist; sit/stand from [facility] with RW, cga; static standing balance for hygiene,  clothing management, close sup.  Fatigues quickly with dynamic activities   Assessment/Plan    PT Assessment Patient needs continued PT services  PT Problem List Decreased strength;Decreased activity tolerance;Decreased balance;Decreased mobility;Cardiopulmonary status limiting activity;Obesity       PT Treatment Interventions DME instruction;Gait training;Stair training;Functional mobility training;Therapeutic activities;Therapeutic exercise;Balance training;Patient/family education    PT Goals (Current goals can be found in the Care Plan section)  Acute Rehab PT Goals Patient Stated Goal: to return home PT Goal Formulation: With patient/family Time For Goal Achievement: 03/04/18 Potential to Achieve Goals: Good    Frequency Min 2X/week   Barriers to discharge Decreased caregiver support      Co-evaluation               AM-PAC PT "6 Clicks" Mobility  Outcome Measure Help needed turning from your back to your side while in a flat bed without using bedrails?: A Little Help needed moving from lying on your back to sitting on the side of a flat bed without using bedrails?: A Little Help needed moving to and from a bed to a chair (including a wheelchair)?: A Little Help needed standing up from a chair using your arms (e.g., wheelchair or bedside chair)?: A Little Help needed to walk in hospital room?: A Little Help needed climbing 3-5 steps with a railing? : A Little 6 Click Score: 18    End of Session Equipment Utilized During Treatment: Gait belt Activity Tolerance: Patient tolerated treatment well Patient left: in chair;with call bell/phone within reach;with chair alarm set Nurse Communication: Mobility status PT Visit Diagnosis: Muscle weakness (generalized) (M62.81);Difficulty in walking, not elsewhere classified (R26.2)    Time: 1117-3567 PT Time Calculation (min) (ACUTE ONLY): 17 min   Charges:   PT Evaluation $PT Eval Moderate Complexity: 1 Mod PT  Treatments $Therapeutic Activity: 8-22 mins        Maston Wight H. Owens Shark, PT, DPT, NCS 02/18/18, 4:02 PM [phone]

## 2018-02-18 NOTE — Progress Notes (Signed)
[place] at [place] NAME: Gail Hardy    MR#:  [id number]  DATE OF BIRTH:  03/24/1945  SUBJECTIVE:   Still has lots of cough, no shortness of breath.  Does have some wheezing.  Hypoxic with sats 87% on room air so now she is on 2 L and sats are 3%.  Patient has continued shaking of left side she tells me that Abilify makes her to have tremors.  Patient sepsis work-up showed positive for metapneumovirus but negative for flu, pneumonia, UTI.  Because she is still coughing she is on droplet precautions. REVIEW OF SYSTEMS:  Review of Systems  Constitutional: Negative for chills and fever.  HENT: Negative for congestion and sore throat.   Eyes: Negative for blurred vision and double vision.  Respiratory: Positive for cough, sputum production and wheezing. Negative for shortness of breath.   Cardiovascular: Negative for chest pain, palpitations and leg swelling.  Gastrointestinal: Negative for nausea and vomiting.  Genitourinary: Negative for dysuria and frequency.  Musculoskeletal: Negative for back pain and neck pain.  Neurological: Negative for dizziness and headaches.  Psychiatric/Behavioral: Negative for depression and suicidal ideas. The patient is not nervous/anxious.     DRUG ALLERGIES:   Allergies  Allergen Reactions  . Diazepam     Other reaction(s): Feeling agitated (finding)   VITALS:  Blood pressure (!) 142/73, pulse 96, temperature 97.7 F (36.5 C), temperature source Oral, resp. rate (!) 22, height 5\' 1"  (1.549 m), weight 99.7 kg, SpO2 96 %. PHYSICAL EXAMINATION:  Physical Exam  GENERAL:  73 y.o.-year-old patient lying in the bed, in NAD. Coughing very frequently. HEENT: Head atraumatic, normocephalic. Oropharynx and nasopharynx clear. No oropharyngeal erythema, moist oral mucosa . Pupils equal, round, reactive to light . No scleral icterus. Extraocular muscles intact.  NECK:  Supple, no jugular venous distention. No  thyroid enlargement, no tenderness.  LUNGS:  Expiratory wheeze in all lung fields.  CARDIOVASCULAR: RRR, S1, S2 normal. No murmurs, rubs, or gallops.  ABDOMEN: Soft, nontender, nondistended. Bowel sounds present. No organomegaly or mass.  EXTREMITIES: No pedal edema, cyanosis, or clubbing. + 2 pedal & radial pulses b/l.   NEUROLOGIC: Cranial nerves II through XII are intact. No focal Motor or sensory deficits appreciated b/l PSYCHIATRIC: The patient is alert and oriented x 3. Good affect.  Anxious, tremors noted on the left side. SKIN: No obvious rash, lesion, or ulcer.  LABORATORY PANEL:  Female CBC Recent Labs  Lab 02/18/18 0456  WBC 12.3*  HGB 13.2  HCT 41.0  PLT 210   ------------------------------------------------------------------------------------------------------------------ Chemistries  Recent Labs  Lab 02/16/18 1130  02/18/18 0456  NA 138   < > 139  K 3.9   < > 4.3  CL 104   < > 109  CO2 23   < > 22  GLUCOSE 98   < > 138*  BUN 14   < > 31*  CREATININE 1.10*   < > 0.83  CALCIUM 9.0   < > 8.9  AST 31  --   --   ALT 25  --   --   ALKPHOS 88  --   --   BILITOT 0.7  --   --    < > = values in this interval not displayed.   RADIOLOGY:  No results found. ASSESSMENT AND PLAN:   Sepsis- patient meeting sepsis criteria on admission with tachypnea, tachycardia, and low grade temperature. Lactic acid elevated to 6.1. Sepsis resolving, lactic  acid is down to 3.1, sepsis work-up showed aspiratory panel positive for metapneumovirus, influenza is negative, chest x-ray did not show pneumonia, but cultures have been negative, urine cultures are pending.  Acute bronchitis- remains on 2L O2.  Influenza negative. -Continue IV steroids -Continue scheduled nebulizers -Wean O2 as tolerated -Repeat chest x-ray yesterday showed no pneumonia. Calcitonin is less than 0.1.   Lactic acidosis- likely related to above. Lactic acid 6.1 on admission.  Trended down to 3.1.   Sinus  tachycardia- likely secondary to sepsis and albuterol -Change albuterol nebs to xopenex -Still tachycardic because of cough, underlying psychiatric condition.  AKI- Cr 1.27 today (baseline 0.6-0.8).  Likely secondary to dehydration and sepsis.,   improved, creatinine today is 0.83.  With IV fluids  Anxiety/Bipolar disorder- stable -Continue home medications.     BuSpar, Abilify, Klonopin, Celexa, Restoril,   Patient still requiring 2 L of oxygen, continue bronchodilators, IV steroids, see how she does.  Wean off oxygen as tolerated, out of bed to chair today  All the records are reviewed and case discussed with Care Management/Social Worker. Management plans discussed with the patient, family and they are in agreement.  CODE STATUS: DNR  TOTAL TIME TAKING CARE OF THIS PATIENT: 40 minutes.   More than 50% of the time was spent in counseling/coordination of care: YES  POSSIBLE D/C tomorrow, DEPENDING ON CLINICAL CONDITION.   Epifanio Lesches M.D on 02/18/2018 at 1:13 PM  Between 7am to 6pm - Pager - [phone]  After 6pm go to [email or web address] - password [email or web address] [facility]  [facility] [place] [facility]  Office  [phone]  CC: Primary care physician; Jerrol Banana., MD  Note: This dictation was prepared with Dragon dictation along with smaller phrase technology. Any transcriptional errors that result from this process are unintentional.

## 2018-02-18 NOTE — Plan of Care (Signed)

## 2018-02-18 NOTE — Plan of Care (Signed)
Pt. continues with 2L [place] oxygen.  Waiting for respiratory cultures to result. Lactic acid is trending down.  Will continue to monitor.

## 2018-02-19 ENCOUNTER — Inpatient Hospital Stay: Payer: PPO

## 2018-02-19 LAB — URINE CULTURE

## 2018-02-19 NOTE — Progress Notes (Addendum)
[place] at [place] NAME: Salisa Broz    MR#:  [id number]  DATE OF BIRTH:  23-Apr-1945  SUBJECTIVE:   Still having cough, shortness of breath, requiring oxygen good appetite, no chest pain.  On 2 L of oxygen and saturation 94%. REVIEW OF SYSTEMS:  Review of Systems  Constitutional: Negative for chills and fever.  HENT: Negative for congestion and sore throat.   Eyes: Negative for blurred vision and double vision.  Respiratory: Positive for cough, sputum production and wheezing. Negative for shortness of breath.   Cardiovascular: Negative for chest pain, palpitations and leg swelling.  Gastrointestinal: Negative for nausea and vomiting.  Genitourinary: Negative for dysuria and frequency.  Musculoskeletal: Negative for back pain and neck pain.  Neurological: Negative for dizziness and headaches.  Psychiatric/Behavioral: Negative for depression and suicidal ideas. The patient is not nervous/anxious.     DRUG ALLERGIES:   Allergies  Allergen Reactions  . Diazepam     Other reaction(s): Feeling agitated (finding)   VITALS:  Blood pressure (!) 109/50, pulse (!) 102, temperature 97.7 F (36.5 C), temperature source Oral, resp. rate (!) 21, height 5\' 1"  (1.549 m), weight 102.4 kg, SpO2 94 %. PHYSICAL EXAMINATION:  Physical Exam  GENERAL:  73 y.o.-year-old patient lying in the bed, in NAD. Coughing very frequently. HEENT: Head atraumatic, normocephalic. Oropharynx and nasopharynx clear. No oropharyngeal erythema, moist oral mucosa . Pupils equal, round, reactive to light . No scleral icterus. Extraocular muscles intact.  NECK:  Supple, no jugular venous distention. No thyroid enlargement, no tenderness.  LUNGS:  Expiratory wheeze in all lung fields   CARDIOVASCULAR: RRR, S1, S2 normal. No murmurs, rubs, or gallops.  ABDOMEN: Soft, nontender, nondistended. Bowel sounds present. No organomegaly or mass.  EXTREMITIES: No pedal edema,  cyanosis, or clubbing. + 2 pedal & radial pulses b/l.   NEUROLOGIC: Cranial nerves II through XII are intact. No focal Motor or sensory deficits appreciated b/l PSYCHIATRIC: The patient is alert and oriented x 3. Good affect.  Anxious, tremors noted on the left side. SKIN: No obvious rash, lesion, or ulcer.  LABORATORY PANEL:  Female CBC Recent Labs  Lab 02/18/18 0456  WBC 12.3*  HGB 13.2  HCT 41.0  PLT 210   ------------------------------------------------------------------------------------------------------------------ Chemistries  Recent Labs  Lab 02/16/18 1130  02/18/18 0456  NA 138   < > 139  K 3.9   < > 4.3  CL 104   < > 109  CO2 23   < > 22  GLUCOSE 98   < > 138*  BUN 14   < > 31*  CREATININE 1.10*   < > 0.83  CALCIUM 9.0   < > 8.9  AST 31  --   --   ALT 25  --   --   ALKPHOS 88  --   --   BILITOT 0.7  --   --    < > = values in this interval not displayed.   RADIOLOGY:  No results found. ASSESSMENT AND PLAN:   Sepsis- patient meeting sepsis criteria on admission with tachypnea, tachycardia, and low grade temperature. Lactic acid elevated to 6.1. Sepsis resolving, lactic acid is down to 3.1, sepsis work-up showed.  Respiratory  panel positive for metapneumovirus, influenza is negative, chest x-ray did not show pneumonia, but cultures have been negative, urine cultures are pending.  Sepsis likely secondary to underlying metapneumovirus.  Continue conservative treatment with IV steroids, bronchodilators, cough medicine.  Acute  bronchitis-likely secondary to metapneumovirus, continue conservative treatment ,Chest x-ray yesterday showed bronchitis so started on Z-Pak, continue IV  Solu-Medrol, 1 dose Lasix is given today. Lactic acidosis- likely related to above. Lactic acid 6.1 on admission.  Trended down to 3.1.   Sinus tachycardia- likely secondary to sepsis and albuterol -Change albuterol nebs to xopenex - AKI- Cr 1.27 today (baseline 0.6-0.8).  Likely secondary  to dehydration and sepsis.,   Improved  Anxiety/Bipolar disorder- stable -Continue home medications.     BuSpar, Abilify, Klonopin, Celexa, Restoril,   Patient still requiring 2 L of oxygen, continue bronchodilators, IV steroids, see how she does.  Wean off oxygen as tolerated, out of bed to chair today  All the records are reviewed and case discussed with Care Management/Social Worker. Management plans discussed with the patient, family and they are in agreement.  CODE STATUS: DNR  TOTAL TIME TAKING CARE OF THIS PATIENT: 40 minutes.   More than 50% of the time was spent in counseling/coordination of care: YES  POSSIBLE D/C tomorrow, DEPENDING ON CLINICAL CONDITION.   Epifanio Lesches M.D on 02/19/2018 at 11:20 AM  Between 7am to 6pm - Pager - [phone]  After 6pm go to [email or web address] - password [email or web address] [facility]  [facility] [place] [facility]  Office  [phone]  CC: Primary care physician; Jerrol Banana., MD  Note: This dictation was prepared with Dragon dictation along with smaller phrase technology. Any transcriptional errors that result from this process are unintentional.

## 2018-02-19 NOTE — Care Management Important Message (Signed)
Important Message  Patient Details  Name: Gail Hardy MRN: [id number] Date of Birth: [birth year]   Medicare Important Message Given:       Shelbie Ammons, RN 02/19/2018, 12:01 PM

## 2018-02-19 NOTE — Care Management (Signed)
Physical therapy is recommending home with home health/therapy. Medicare government query completed. One placed on chart. One given to Ms. Bouza. Discussed home health services. States "my husband helps me." Declining services at this time. Shelbie Ammons RN MSN CCM Care Management [phone]

## 2018-02-20 LAB — CREATININE, SERUM
Creatinine, Ser: 0.71 mg/dL (ref 0.44–1.00)
GFR calc Af Amer: >60 mL/min (ref >60)
GFR calc non Af Amer: >60 mL/min (ref >60)

## 2018-02-20 LAB — CBC
HCT: 41 % (ref 36.0–46.0)
Hemoglobin: 13.2 g/dL (ref 12.0–15.0)
MCH: 29.4 pg (ref 26.0–34.0)
MCHC: 32.2 g/dL (ref 30.0–36.0)
MCV: 91.3 fL (ref 80.0–100.0)
NRBC: 0 % (ref 0.0–0.2)
Platelets: 219 10*3/uL (ref 150–400)
RBC: 4.49 MIL/uL (ref 3.87–5.11)
RDW: 14.5 % (ref 11.5–15.5)
WBC: 7.6 10*3/uL (ref 4.0–10.5)

## 2018-02-20 MED ORDER — AZITHROMYCIN 500 MG PO TABS
500.0000 mg | ORAL_TABLET | Freq: Every day | ORAL | Status: AC
  Administered 2018-02-20: 09:00:00 500 mg via ORAL
  Filled 2018-02-20: qty 1

## 2018-02-20 MED ORDER — FUROSEMIDE 10 MG/ML IJ SOLN
40.0000 mg | Freq: Once | INTRAMUSCULAR | Status: AC
  Administered 2018-02-20: 12:00:00 40 mg via INTRAVENOUS
  Filled 2018-02-20: qty 4

## 2018-02-20 MED ORDER — AZITHROMYCIN 500 MG PO TABS
250.0000 mg | ORAL_TABLET | Freq: Every day | ORAL | Status: DC
  Administered 2018-02-21 – 2018-02-22 (×2): 250 mg via ORAL
  Filled 2018-02-20 (×2): qty 1

## 2018-02-20 NOTE — Plan of Care (Signed)

## 2018-02-20 NOTE — Progress Notes (Signed)
Physical Therapy Treatment Patient Details Name: Gail Hardy MRN: [id number] DOB: [birth year] Today's Date: 02/20/2018    History of Present Illness presented to ER seconadry to cough, congestion and SOB x4 days; admitted with sepsis related to acute bronchitis.    PT Comments    Patient received in bed, family present. Agrees to ambulate. Patient continues to have supplemental O2, no O2 at home. Patient performed all activities with PT on room air being closely monitored. O2 sats remained from 91%-95% throughout session ( walking, sit to stand x 3 reps). Patient returned to nasal canula once in bed as sats did decrease to 91%. Patient will benefit from continued PT to address weakness, and decreased activity tolerance for her to return home safely.          Follow Up Recommendations  Home health PT     Equipment Recommendations       Recommendations for Other Services       Precautions / Restrictions Precautions Precautions: Fall Restrictions Weight Bearing Restrictions: No    Mobility  Bed Mobility Overal bed mobility: Needs Assistance Bed Mobility: Supine to Sit     Supine to sit: Min assist        Transfers Overall transfer level: Needs assistance Equipment used: Rolling walker (2 wheeled) Transfers: Sit to/from Stand Sit to Stand: Min assist;Min guard         General transfer comment: cuing for hand placement  Ambulation/Gait Ambulation/Gait assistance: Min guard Gait Distance (Feet): 60 Feet Assistive device: Rolling walker (2 wheeled) Gait Pattern/deviations: Trunk flexed;Shuffle;Decreased step length - right;Decreased step length - left Gait velocity: decreased   General Gait Details: patient ambulated 20 feet x 3 reps with seated rest between each rep. O2 sats remained WNL on room air.    Stairs             Wheelchair Mobility    Modified Rankin (Stroke Patients Only)       Balance Overall balance assessment:  Independent Sitting-balance support: Feet supported Sitting balance-Leahy Scale: Good     Standing balance support: Bilateral upper extremity supported Standing balance-Leahy Scale: Good                              Cognition Arousal/Alertness: Awake/alert Behavior During Therapy: WFL for tasks assessed/performed Overall Cognitive Status: Within Functional Limits for tasks assessed                                        Exercises      General Comments        Pertinent Vitals/Pain Pain Assessment: No/denies pain    Home Living Family/patient expects to be discharged to:: Private residence Living Arrangements: Spouse/significant other Available Help at Discharge: Family;Available 24 hours/day Type of Home: House Home Access: Stairs to enter Entrance Stairs-Rails: Right;Left Home Layout: One level        Prior Function Level of Independence: Independent with assistive device(s)      Comments: Mod indep for household distances (intermittent furniture grabbing), use of RW for limited community distances; husband assists with ADLs, household responsibilities as needed   PT Goals (current goals can now be found in the care plan section) Acute Rehab PT Goals Patient Stated Goal: to return home PT Goal Formulation: With patient/family Time For Goal Achievement: 03/04/18 Potential to Achieve Goals: Good  Frequency    Min 2X/week      PT Plan      Co-evaluation              AM-PAC PT "6 Clicks" Mobility   Outcome Measure  Help needed turning from your back to your side while in a flat bed without using bedrails?: None Help needed moving from lying on your back to sitting on the side of a flat bed without using bedrails?: A Little Help needed moving to and from a bed to a chair (including a wheelchair)?: A Little Help needed standing up from a chair using your arms (e.g., wheelchair or bedside chair)?: A Little Help needed to  walk in hospital room?: A Little Help needed climbing 3-5 steps with a railing? : A Little 6 Click Score: 19    End of Session Equipment Utilized During Treatment: Gait belt Activity Tolerance: Patient tolerated treatment well Patient left: in bed;with family/visitor present;with call bell/phone within reach Nurse Communication: Mobility status PT Visit Diagnosis: Muscle weakness (generalized) (M62.81);Difficulty in walking, not elsewhere classified (R26.2)     Time: 1415-1440 PT Time Calculation (min) (ACUTE ONLY): 25 min  Charges:  $Gait Training: 23-37 mins                     Evalee Gerard, PT, GCS 02/20/18,3:00 PM

## 2018-02-21 NOTE — Progress Notes (Signed)
Ambulated 2nd time with walker around the NS with CNA and tolerated well.

## 2018-02-21 NOTE — Progress Notes (Signed)
Reports feeling some better; some audible wheezing without distress; rhonchi with decreased breath sounds. Continue oral antibiotics and nebulizer treatments.

## 2018-02-21 NOTE — Progress Notes (Signed)
Ambulated in hall with pulse ox 90-91 % RA with HR 109 to end of hall and back to room with walker and tolerated well.

## 2018-02-21 NOTE — Progress Notes (Signed)
[place] at [place] NAME: Gail Hardy    MR#:  [id number]  DATE OF BIRTH:  Jun 12, 1945  SUBJECTIVE:   Still having cough, shortness of breath, requiring oxygen good appetite, no chest pain.  On 2 L of oxygen and saturation 94%.  Physical therapy recommends home health physical therapy.  Likely discharge home tomorrow. REVIEW OF SYSTEMS:  Review of Systems  Constitutional: Negative for chills and fever.  HENT: Negative for congestion and sore throat.   Eyes: Negative for blurred vision and double vision.  Respiratory: Positive for cough, sputum production and wheezing. Negative for shortness of breath.   Cardiovascular: Negative for chest pain, palpitations and leg swelling.  Gastrointestinal: Negative for nausea and vomiting.  Genitourinary: Negative for dysuria and frequency.  Musculoskeletal: Negative for back pain and neck pain.  Neurological: Negative for dizziness and headaches.  Psychiatric/Behavioral: Negative for depression and suicidal ideas. The patient is not nervous/anxious.     DRUG ALLERGIES:   Allergies  Allergen Reactions  . Diazepam     Other reaction(s): Feeling agitated (finding)   VITALS:  Blood pressure 126/71, pulse 86, temperature 97.6 F (36.4 C), temperature source Oral, resp. rate 17, height 5\' 1"  (1.549 m), weight 94.4 kg, SpO2 92 %. PHYSICAL EXAMINATION:  Physical Exam  GENERAL:  73 y.o.-year-old patient lying in the bed, in NAD. Coughing very frequently. HEENT: Head atraumatic, normocephalic. Oropharynx and nasopharynx clear. No oropharyngeal erythema, moist oral mucosa . Pupils equal, round, reactive to light . No scleral icterus. Extraocular muscles intact.  NECK:  Supple, no jugular venous distention. No thyroid enlargement, no tenderness.  LUNGS:  Expiratory wheeze in all lung fields   CARDIOVASCULAR: RRR, S1, S2 normal. No murmurs, rubs, or gallops.  ABDOMEN: Soft, nontender, nondistended.  Bowel sounds present. No organomegaly or mass.  EXTREMITIES: No pedal edema, cyanosis, or clubbing. + 2 pedal & radial pulses b/l.   NEUROLOGIC: Cranial nerves II through XII are intact. No focal Motor or sensory deficits appreciated b/l PSYCHIATRIC: The patient is alert and oriented x 3. Good affect.  Anxious, tremors noted on the left side. SKIN: No obvious rash, lesion, or ulcer.  LABORATORY PANEL:  Female CBC Recent Labs  Lab 02/20/18 0454  WBC 7.6  HGB 13.2  HCT 41.0  PLT 219   ------------------------------------------------------------------------------------------------------------------ Chemistries  Recent Labs  Lab 02/16/18 1130  02/18/18 0456 02/20/18 0454  NA 138   < > 139  --   K 3.9   < > 4.3  --   CL 104   < > 109  --   CO2 23   < > 22  --   GLUCOSE 98   < > 138*  --   BUN 14   < > 31*  --   CREATININE 1.10*   < > 0.83 0.71  CALCIUM 9.0   < > 8.9  --   AST 31  --   --   --   ALT 25  --   --   --   ALKPHOS 88  --   --   --   BILITOT 0.7  --   --   --    < > = values in this interval not displayed.   RADIOLOGY:  No results found. ASSESSMENT AND PLAN:   Sepsis- patient meeting sepsis criteria on admission with tachypnea, tachycardia, and low grade temperature. Lactic acid elevated to 6.1. Sepsis resolving, lactic acid is down to 3.1, sepsis  work-up showed.  Respiratory  panel positive for metapneumovirus, influenza is negative, chest x-ray did not show pneumonia, but cultures have been negative, urine cultures are pending.  Sepsis likely secondary to underlying metapneumovirus.  Continue conservative treatment with IV steroids, bronchodilators, cough medicine.  Acute bronchitis-likely secondary to metapneumovirus, continue conservative treatment ,Chest x-ray yesterday showed bronchitis so started on Z-Pak, continue IV  Solu-Medrol, 1 dose Lasix is given today. Lactic acidosis- likely related to above. Lactic acid 6.1 on admission.  Trended down to 3.1.  Less  wheezing today. Sinus tachycardia- likely secondary to sepsis and albuterol -Change albuterol nebs to xopenex - AKI- Cr 1.27 today (baseline 0.6-0.8).  Likely secondary to dehydration and sepsis.,   Improved  Anxiety/Bipolar disorder- stable -Continue home medications.     BuSpar, Abilify, Klonopin, Celexa, Restoril,   Patient still requiring 2 L of oxygen, continue bronchodilators, IV steroids,  Likely discharge home tomorrow. All the records are reviewed and case discussed with Care Management/Social Worker. Management plans discussed with the patient, family and they are in agreement.  CODE STATUS: DNR  TOTAL TIME TAKING CARE OF THIS PATIENT: 40 minutes.   More than 50% of the time was spent in counseling/coordination of care: YES  POSSIBLE D/C tomorrow, DEPENDING ON CLINICAL CONDITION.   Epifanio Lesches M.D on 02/21/2018 at 11:28 AM  Between 7am to 6pm - Pager - [phone]  After 6pm go to [email or web address] - password [email or web address] [facility]  [employer] [facility] [place] [facility]  Office  [phone]  CC: Primary care physician; Jerrol Banana., MD  Note: This dictation was prepared with Dragon dictation along with smaller phrase technology. Any transcriptional errors that result from this process are unintentional.

## 2018-02-22 LAB — CULTURE, BLOOD (ROUTINE X 2)
Culture: NO GROWTH
Culture: NO GROWTH
Special Requests: ADEQUATE

## 2018-02-22 MED ORDER — GUAIFENESIN-CODEINE 100-10 MG/5ML PO SOLN: 5.0000 mL | Freq: Four times a day (QID) | ORAL | 0 refills | Status: AC | PRN

## 2018-02-22 MED ORDER — BENZONATATE 200 MG PO CAPS: 200.0000 mg | ORAL_CAPSULE | Freq: Three times a day (TID) | ORAL | 0 refills | Status: AC | PRN

## 2018-02-22 MED ORDER — PREDNISONE 10 MG (21) PO TBPK: ORAL_TABLET | ORAL | 0 refills | Status: AC

## 2018-02-22 MED ORDER — AZITHROMYCIN 250 MG PO TABS: 250.0000 mg | ORAL_TABLET | Freq: Every day | ORAL | 0 refills | Status: AC

## 2018-02-22 MED ORDER — ALBUTEROL SULFATE HFA 108 (90 BASE) MCG/ACT IN AERS: 2.0000 | INHALATION_SPRAY | Freq: Four times a day (QID) | RESPIRATORY_TRACT | 1 refills | Status: AC | PRN

## 2018-02-22 NOTE — Discharge Summary (Signed)
Gail Hardy, is a 73 y.o. female  DOB December 23, 1945  MRN [id number].  Admission date:  02/16/2018  Admitting Physician  Hillary Bow, MD  Discharge Date:  02/22/2018   Primary MD  Jerrol Banana., MD  Recommendations for primary care physician for things to follow:   Follow-up with PCP in 1 week   Admission Diagnosis  Bronchitis [J40]   Discharge Diagnosis  Bronchitis [J40]    Active Problems:   Acute bronchitis      Past Medical History:  Diagnosis Date  . Anemia   . Aneurysm ([place]) 2011  . Blood transfusion without reported diagnosis 2013,2014   x 2  . Cancer ([place])   . Depression   . Family history of adverse reaction to anesthesia    daughter nausea and vomiting  . Glaucoma   . Hernia   . Hyperlipidemia   . Pinched nerve   . Pulmonary emboli ([place]) 2011  . Stroke [facility]) 2010    Past Surgical History:  Procedure Laterality Date  . ABDOMINAL HYSTERECTOMY    . BREAST EXCISIONAL BIOPSY Right    pt states she had "lumps removed from breast as a teen"  . CEREBRAL ANEURYSM REPAIR  2011   stent and coil insertion  . CHOLECYSTECTOMY    . COLONOSCOPY  2013   [facility]  . HERNIA REPAIR  2014   lap paraesophageal repair  . INTRAMEDULLARY (IM) NAIL INTERTROCHANTERIC Left 10/19/2014   Procedure: INTRAMEDULLARY (IM) NAIL INTERTROCHANTRIC;  Surgeon: Earnestine Leys, MD;  Location: [facility] ORS;  Service: Orthopedics;  Laterality: Left;  . UPPER GI ENDOSCOPY  2013, 2014   [place]       History of present illness and  Hospital Course:     Kindly see H&P for history of present illness and admission details, please review complete Labs, Consult reports and Test reports for all details in brief  HPI  from the history and physical done on the day of admission 73 year old female with anxiety, hyperlipidemia,  depression, PE comes in because of shortness of breath, wheezing, cough, sinus tachycardia and found to have viral bronchitis.   Hospital Course  Respiratory failure with hypoxia secondary to acute bronchitis: Patient had tachypnea, tachycardia with low-grade temp on admission, meets criteria for sepsis on admission, lactic acid not elevated, sepsis work-up has been done showed chest x-ray negative UA no infection blood culture did not show infection flu test has been negative but RSV is panel was positive for metapneumovirus, patient received conservative treatment with bronchodilators, IV steroids, oxygen, chest x-ray repeated showed only bronchitis, because she has persistent cough started on Z-Pak.  Patient feels better today morning, afebrile, O2 sats 93% on 1 L, check ambulatory oxygen saturation to see if she qualifies for home oxygen otherwise discharged home with bronchodilators, tapering steroids, patient needs azithromycin to 50 mg daily for 3 more doses.    Discharge Condition: Stable  Follow UP      Discharge Instructions  and  Discharge Medications  Follow-up with PCP in 1 week   Allergies as of 02/22/2018      Reactions   Diazepam    Other reaction(s): Feeling agitated (finding)      Medication List    TAKE these medications   albuterol 108 (90 Base) MCG/ACT inhaler Commonly known as:  PROVENTIL HFA;VENTOLIN HFA Inhale 2 puffs into the lungs every 6 (six) hours as needed for wheezing or shortness of breath.   ARIPiprazole 30 MG tablet Commonly known as:  ABILIFY TAKE 1 TABLET BY MOUTH ONCE A DAY   azithromycin 250 MG tablet Commonly known as:  ZITHROMAX Take 1 tablet (250 mg total) by mouth daily.   benzonatate 200 MG capsule Commonly known as:  TESSALON Take 1 capsule (200 mg total) by mouth 3 (three) times daily as needed for cough.   buPROPion 150 MG 12 hr tablet Commonly known as:  WELLBUTRIN SR TAKE 1 TABLET BY MOUTH ONCE A DAY   citalopram 40 MG  tablet Commonly known as:  CELEXA Take 1 tablet (40 mg total) by mouth daily.   clonazePAM 0.5 MG tablet Commonly known as:  KLONOPIN TAKE 1/2 TABLET (0.25MG  TOTAL) BY MOUTH EVERY 8 HOURS AS NEEDED FOR ANXIETY.   gabapentin 400 MG capsule Commonly known as:  NEURONTIN TAKE 1 CAPSULE BY MOUTH 3 TIMES DAILY   guaiFENesin-codeine 100-10 MG/5ML syrup Take 5 mLs by mouth every 6 (six) hours as needed for cough.   latanoprost 0.005 % ophthalmic solution Commonly known as:  XALATAN Place 1 drop into both eyes at bedtime.   predniSONE 10 MG (21) Tbpk tablet Commonly known as:  STERAPRED UNI-PAK 21 TAB Taper 10 mg p.o. daily   temazepam 15 MG capsule Commonly known as:  RESTORIL TAKE 1 CAPSULE BY MOUTH ONCE DAILY         Diet and Activity recommendation: See Discharge Instructions above   Consults obtained -physical therapy   Major procedures and Radiology Reports - PLEASE review detailed and final reports for all details, in brief -      Dg Chest 2 View  Result Date: 02/17/2018 CLINICAL DATA:  Cough EXAM: CHEST - 2 VIEW COMPARISON:  02/16/2017 FINDINGS: Cardiac shadow is within normal limits. The lungs are well aerated bilaterally. Postsurgical changes at the gastroesophageal junction are noted. No acute bony abnormality is seen. IMPRESSION: No acute abnormality noted. Electronically Signed   By: Inez Catalina M.D.   On: 02/17/2018 09:56   Dg Chest 2 View  Result Date: 02/16/2018 : No acute abnormality seen. Electronically Signed   By: Inez Catalina M.D.   On: 02/16/2018 12:02   Dg Chest Port 1 View  Result Date: 02/19/2018 CLINICAL DATA:  Shortness of breath. EXAM: PORTABLE CHEST 1 VIEW COMPARISON:  02/17/2018. FINDINGS: Mediastinum and hilar structures normal. Heart size normal. Mild bilateral peribronchial cuffing. Bronchitis could not be excluded. Mild basilar atelectasis. No pleural effusion or pneumothorax. IMPRESSION: 1. Mild bilateral peribronchial cuffing. Bronchitis  could not be excluded. 2.  Low lung volumes with mild basilar atelectasis. Electronically Signed   By: Marcello Moores  Register   On: 02/19/2018 12:18    Micro Results    Recent Results (from the past 240 hour(s))  CULTURE, BLOOD (ROUTINE X 2) w Reflex to ID Panel     Status: None   Collection Time: 02/17/18  3:52 PM  Result Value Ref Range Status   Specimen Description BLOOD BLOOD LEFT FOREARM  Final   Special Requests   Final    BOTTLES DRAWN AEROBIC AND ANAEROBIC Blood Culture adequate volume   Culture   Final    NO GROWTH 5 DAYS Performed at [facility], [address] [place], [place] [address]    Report Status 02/22/2018 FINAL  Final  CULTURE, BLOOD (ROUTINE X 2) w Reflex to ID Panel     Status: None   Collection Time: 02/17/18  3:59 PM  Result Value Ref Range Status   Specimen Description BLOOD RIGHT ANTECUBITAL  Final   Special  Requests   Final    BOTTLES DRAWN AEROBIC AND ANAEROBIC Blood Culture results may not be optimal due to an excessive volume of blood received in culture bottles   Culture   Final    NO GROWTH 5 DAYS Performed at [facility], [address] [place], [place] [address]    Report Status 02/22/2018 FINAL  Final  Respiratory Panel by PCR     Status: Abnormal   Collection Time: 02/17/18  5:04 PM  Result Value Ref Range Status   Adenovirus NOT DETECTED NOT DETECTED Final   Coronavirus 229E NOT DETECTED NOT DETECTED Final   Coronavirus HKU1 NOT DETECTED NOT DETECTED Final   Coronavirus NL63 NOT DETECTED NOT DETECTED Final   Coronavirus OC43 NOT DETECTED NOT DETECTED Final   Metapneumovirus DETECTED (A) NOT DETECTED Final   Rhinovirus / Enterovirus NOT DETECTED NOT DETECTED Final   Influenza A NOT DETECTED NOT DETECTED Final   Influenza B NOT DETECTED NOT DETECTED Final   Parainfluenza Virus 1 NOT DETECTED NOT DETECTED Final   Parainfluenza Virus 2 NOT DETECTED NOT DETECTED Final   Parainfluenza Virus 3 NOT DETECTED NOT DETECTED Final    Parainfluenza Virus 4 NOT DETECTED NOT DETECTED Final   Respiratory Syncytial Virus NOT DETECTED NOT DETECTED Final   Bordetella pertussis NOT DETECTED NOT DETECTED Final   Chlamydophila pneumoniae NOT DETECTED NOT DETECTED Final   Mycoplasma pneumoniae NOT DETECTED NOT DETECTED Final    Comment: Performed at [facility], [place] [address] [place], [place] [address]  Urine Culture     Status: Abnormal   Collection Time: 02/17/18  5:04 PM  Result Value Ref Range Status   Specimen Description   Final    URINE, RANDOM Performed at [facility], [address], [place], [place] [address]    Special Requests   Final    NONE Performed at [facility], [address] [place], [place] [address]    Culture MULTIPLE SPECIES PRESENT, SUGGEST RECOLLECTION (A)  Final   Report Status 02/19/2018 FINAL  Final       Today   Subjective:   Gail Hardy today for the shortness of breath.  Does have some cough but better than before  Objective:   Blood pressure (!) 138/57, pulse (!) 111, temperature 97.8 F (36.6 C), temperature source Oral, resp. rate (!) 22, height 5\' 1"  (1.549 m), weight 95.4 kg, SpO2 93 %.   Intake/Output Summary (Last 24 hours) at 02/22/2018 0926 Last data filed at 02/21/2018 2100 Gross per 24 hour  Intake 240 ml  Output -  Net 240 ml    Exam Awake Alert, Oriented x 3, No new F.N deficits, Normal affect .AT,PERRAL Supple Neck,No JVD, No cervical lymphadenopathy appriciated.  Symmetrical Chest wall movement, Good air movement bilaterally, CTAB RRR,No Gallops,Rubs or new Murmurs, No Parasternal Heave +ve B.Sounds, Abd Soft, Non tender, No organomegaly appriciated, No rebound -guarding or rigidity. No Cyanosis, Clubbing or edema, No new Rash or bruise  Data Review   CBC w Diff:  Lab Results  Component Value Date   WBC 7.6 02/20/2018   HGB 13.2 02/20/2018   HGB 15.8 04/24/2016   HCT 41.0 02/20/2018   HCT 45.3 04/24/2016   PLT  219 02/20/2018   PLT 253 04/24/2016   LYMPHOPCT 35 01/01/2017   LYMPHOPCT 32.0 06/17/2013   MONOPCT 11 01/01/2017   MONOPCT 7.6 06/17/2013   EOSPCT 2 01/01/2017   EOSPCT 1.4 06/17/2013   BASOPCT 1 01/01/2017  BASOPCT 0.7 06/17/2013    CMP:  Lab Results  Component Value Date   NA 139 02/18/2018   NA 142 04/24/2016   NA 139 10/13/2012   K 4.3 02/18/2018   K 3.6 10/13/2012   CL 109 02/18/2018   CL 107 10/13/2012   CO2 22 02/18/2018   CO2 27 10/13/2012   BUN 31 (H) 02/18/2018   BUN 19 04/24/2016   BUN 18 10/13/2012   CREATININE 0.71 02/20/2018   CREATININE 0.73 10/13/2012   PROT 8.2 (H) 02/16/2018   PROT 7.6 04/24/2016   PROT 7.7 06/02/2012   ALBUMIN 4.2 02/16/2018   ALBUMIN 4.7 04/24/2016   ALBUMIN 3.7 06/02/2012   BILITOT 0.7 02/16/2018   BILITOT 0.5 04/24/2016   BILITOT 0.2 06/02/2012   ALKPHOS 88 02/16/2018   ALKPHOS 111 06/02/2012   AST 31 02/16/2018   AST 15 06/02/2012   ALT 25 02/16/2018   ALT 22 06/02/2012  .   Total Time in preparing paper work, data evaluation and todays exam - 35 minutes  Epifanio Lesches M.D on 02/22/2018 at 9:26 AM    Note: This dictation was prepared with Dragon dictation along with smaller phrase technology. Any transcriptional errors that result from this process are unintentional.

## 2018-02-22 NOTE — Progress Notes (Signed)
Pt reports feeling much better this morning. Faint wheezes by ascultation with diminished breath sounds. Ambulated with walker in hall with pulse ox 92 % RA post walking. States she is ready to be discharged home. Oral and written AVS instructions with prescriptions given. Pt will be transported in transport chair to private vehicle when husband arrives to pick her up. Will discharge home to self/family care.

## 2018-02-22 NOTE — Progress Notes (Signed)
Refused health physical therapy.

## 2018-02-22 NOTE — Progress Notes (Signed)
Patient rested well through the night. Not in any form of distress. Maintains oxygen at 1LPM/[place], saturating around 91-92%.  Received breathing treatments per RT. Needs attended.

## 2018-02-23 ENCOUNTER — Telehealth: Payer: Self-pay

## 2018-02-23 NOTE — Telephone Encounter (Signed)
Transition Care Management Follow-up Telephone Call  Date of discharge and from where: [facility] on 02/22/18.  How have you been since you were released from the hospital? Still not feeling good, has a lot of congestion and is coughing. Sputum is white. Still SOB but is using the inhaler to help. Declines fever, pain or n/v/d.  Any questions or concerns? No   Items Reviewed:  Did the pt receive and understand the discharge instructions provided? Yes   Medications obtained and verified? Pt declined at this time.   Any new allergies since your discharge? No   Dietary orders reviewed? N/A  Do you have support at home? Yes   Other (ie: DME, Home Health, etc) N/A  Functional Questionnaire: (I = Independent and D = Dependent)  Bathing/Dressing- D   Meal Prep- D  Eating- I  Maintaining continence- I  Transferring/Ambulation- Uses a walker for assistance.   Managing Meds- D   Follow up appointments reviewed:    PCP Hospital f/u appt confirmed? Yes  Scheduled to see Dr Rosanna Randy on 03/03/18 @ 2:20 PM.  [facility] f/u appt confirmed? N/A  Are transportation arrangements needed? No   If their condition worsens, is the pt aware to call  their PCP or go to the ED? Yes  Was the patient provided with contact information for the PCP's office or ED? Yes  Was the pt encouraged to call back with questions or concerns? Yes

## 2018-03-03 ENCOUNTER — Ambulatory Visit (INDEPENDENT_AMBULATORY_CARE_PROVIDER_SITE_OTHER): Payer: PPO | Admitting: Family Medicine

## 2018-03-03 ENCOUNTER — Encounter: Payer: Self-pay | Admitting: Family Medicine

## 2018-03-03 VITALS — BP 118/74 | HR 118 | Temp 97.9°F | Wt 215.6 lb

## 2018-03-03 DIAGNOSIS — G20 Parkinson's disease: Secondary | ICD-10-CM

## 2018-03-03 DIAGNOSIS — I671 Cerebral aneurysm, nonruptured: Secondary | ICD-10-CM | POA: Diagnosis not present

## 2018-03-03 DIAGNOSIS — J4521 Mild intermittent asthma with (acute) exacerbation: Secondary | ICD-10-CM | POA: Diagnosis not present

## 2018-03-03 DIAGNOSIS — G20A1 Parkinson's disease without dyskinesia, without mention of fluctuations: Secondary | ICD-10-CM

## 2018-03-03 DIAGNOSIS — F339 Major depressive disorder, recurrent, unspecified: Secondary | ICD-10-CM | POA: Diagnosis not present

## 2018-03-03 MED ORDER — CLONAZEPAM 0.5 MG PO TABS: ORAL_TABLET | ORAL | 4 refills | Status: AC

## 2018-03-03 NOTE — Progress Notes (Signed)
Patient: Gail Hardy Female    DOB: March 13, 1945   73 y.o.   MRN: [id number] Visit Date: 03/03/2018  Today's Provider: Wilhemena Durie, MD   Chief Complaint  Patient presents with  . Hospitalization Follow-up   Subjective:     HPI   Hospital follow up  Patient presents for hospital admission follow up from 02/16/2018- 02/22/2018 due to Bronchitis. Patient had a transitions of care phone call on 02/23/2018.  While in hospital patient had a chest x-ray and labs done. Patient still experiencing cough. Denies any other symptoms at this time.  She is feeling better and getting her strength back.  Allergies  Allergen Reactions  . Diazepam     Other reaction(s): Feeling agitated (finding)     Current Outpatient Medications:  .  albuterol (PROVENTIL HFA;VENTOLIN HFA) 108 (90 Base) MCG/ACT inhaler, Inhale 2 puffs into the lungs every 6 (six) hours as needed for wheezing or shortness of breath., Disp: 1 Inhaler, Rfl: 1 .  ARIPiprazole (ABILIFY) 30 MG tablet, TAKE 1 TABLET BY MOUTH ONCE A DAY, Disp: 30 tablet, Rfl: 11 .  benzonatate (TESSALON) 200 MG capsule, Take 1 capsule (200 mg total) by mouth 3 (three) times daily as needed for cough., Disp: 20 capsule, Rfl: 0 .  buPROPion (WELLBUTRIN SR) 150 MG 12 hr tablet, TAKE 1 TABLET BY MOUTH ONCE A DAY, Disp: 90 tablet, Rfl: 3 .  citalopram (CELEXA) 40 MG tablet, Take 1 tablet (40 mg total) by mouth daily., Disp: 30 tablet, Rfl: 0 .  clonazePAM (KLONOPIN) 0.5 MG tablet, TAKE 1/2 TABLET (0.25MG  TOTAL) BY MOUTH EVERY 8 HOURS AS NEEDED FOR ANXIETY., Disp: 30 tablet, Rfl: 4 .  gabapentin (NEURONTIN) 400 MG capsule, TAKE 1 CAPSULE BY MOUTH 3 TIMES DAILY, Disp: 270 capsule, Rfl: 3 .  guaiFENesin-codeine 100-10 MG/5ML syrup, Take 5 mLs by mouth every 6 (six) hours as needed for cough., Disp: 120 mL, Rfl: 0 .  latanoprost (XALATAN) 0.005 % ophthalmic solution, Place 1 drop into both eyes at bedtime. , Disp: , Rfl:  .  predniSONE (STERAPRED  UNI-PAK 21 TAB) 10 MG (21) TBPK tablet, Taper 10 mg p.o. daily, Disp: 21 tablet, Rfl: 0 .  temazepam (RESTORIL) 15 MG capsule, TAKE 1 CAPSULE BY MOUTH ONCE DAILY, Disp: 30 capsule, Rfl: 5 .  azithromycin (ZITHROMAX) 250 MG tablet, Take 1 tablet (250 mg total) by mouth daily. (Patient not taking: Reported on 03/03/2018), Disp: 3 tablet, Rfl: 0  Review of Systems  Constitutional: Negative.   HENT: Negative.   Eyes: Negative.   Respiratory: Positive for cough.   Endocrine: Negative.   Genitourinary: Negative.   Allergic/Immunologic: Negative.   Neurological: Negative.   Psychiatric/Behavioral: Negative.     Social History   Tobacco Use  . Smoking status: Former Smoker    Years: 10.00    Types: Cigarettes    Last attempt to quit: 02/10/1989    Years since quitting: 29.0  . Smokeless tobacco: Never Used  Substance Use Topics  . Alcohol use: No      Objective:   BP 118/74 (BP Location: Left Arm, Patient Position: Sitting, Cuff Size: Large)   Pulse (!) 118   Temp 97.9 F (36.6 C) (Oral)   Wt 215 lb 9.6 oz (97.8 kg)   SpO2 98%   BMI 40.74 kg/m  Vitals:   03/03/18 1429  BP: 118/74  Pulse: (!) 118  Temp: 97.9 F (36.6 C)  TempSrc: Oral  SpO2: 98%  Weight: 215 lb 9.6 oz (97.8 kg)     Physical Exam Constitutional:      Appearance: She is well-developed. She is obese.     Comments: Obese WF NAD.  HENT:     Head: Normocephalic and atraumatic.     Right Ear: External ear normal.     Left Ear: External ear normal.     Nose: Nose normal.  Eyes:     General: No scleral icterus.    Conjunctiva/sclera: Conjunctivae normal.  Neck:     Thyroid: No thyromegaly.  Cardiovascular:     Rate and Rhythm: Normal rate and regular rhythm.     Heart sounds: Normal heart sounds.  Pulmonary:     Effort: Pulmonary effort is normal.     Breath sounds: Normal breath sounds.  Abdominal:     Palpations: Abdomen is soft.  Skin:    General: Skin is warm and dry.  Neurological:      Mental Status: She is alert and oriented to person, place, and time. Mental status is at baseline.     Comments: Chronic Tremor of hands.  Psychiatric:        Behavior: Behavior normal.        Thought Content: Thought content normal.        Judgment: Judgment normal.         Assessment & Plan    1. Mild intermittent asthmatic bronchitis with acute exacerbation Pt much improved.  2. Aneurysm, cerebral Repaired.  3. Paralysis agitans (HCC) SE of Mood stabilizer.  4. Recurrent major depressive disorder, remission status unspecified ([place]) Bopolar depression.Controlled    I have done the exam and reviewed the above chart and it is accurate to the best of my knowledge. Development worker, community has been used in this note in any air is in the dictation or transcription are unintentional.  Wilhemena Durie, MD  [place]

## 2018-05-13 DEATH — deceased

## 2018-07-14 ENCOUNTER — Ambulatory Visit: Payer: Self-pay | Admitting: Family Medicine

## 2018-09-14 ENCOUNTER — Ambulatory Visit: Payer: PPO

## 2018-12-23 IMAGING — CT NM BONE 3 PHASE
1 series · 12 of 14 positions shown, 15 images · non-contrast
Comparison: Whole-body bone scan 10/06/2014

Radiographic correlation:  LEFT hip radiographs 09/15/2015

CLINICAL DATA: LEFT hip pain for couple months, had IM nail placed
in LEFT hip on 10/19/2014

EXAM:
NUCLEAR MEDICINE 3-PHASE BONE SCAN WITH SPECT CT
TECHNIQUE: Radionuclide angiographic images, immediate static blood pool
images, and 3-hour delayed static images were obtained of the hips
after intravenous injection of radiopharmaceutical.
RADIOPHARMACEUTICALS:  23.458 mCi Ic-66m MDP

[Series 4: 3d bone 1.25 b70s · axial · 0.98mm/px · z∈[+1084,+1404]mm · 12 of 541 slices shown, 15 images]
[im 42/541  soft-tissue]
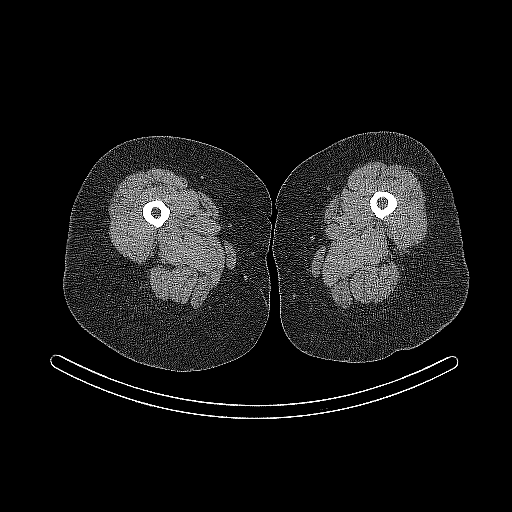
[im 42/541  bone]
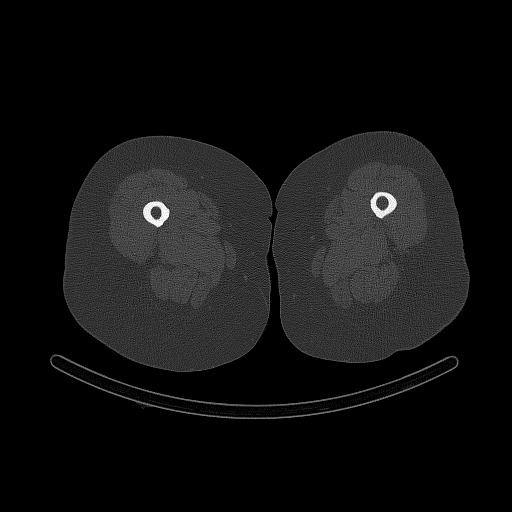
[im 84/541  bone]
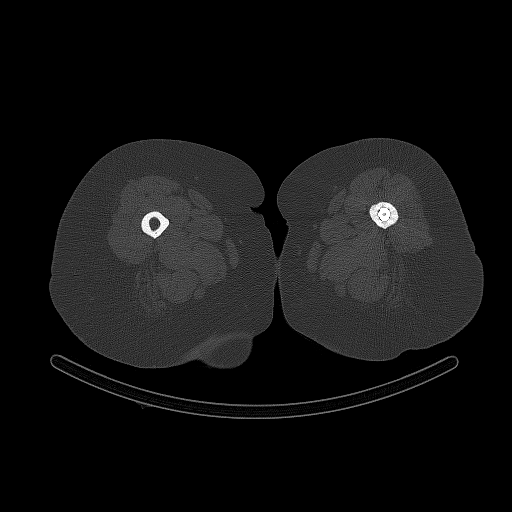
[im 125/541  bone]
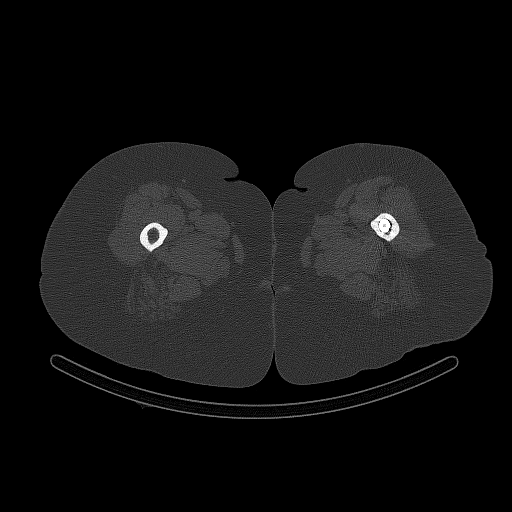
[im 167/541  bone]
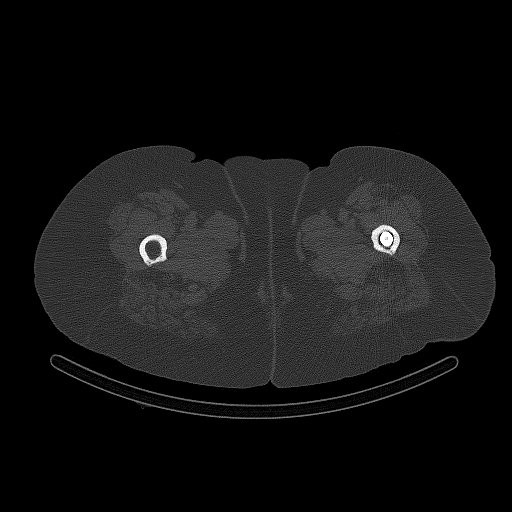
[im 208/541  soft-tissue]
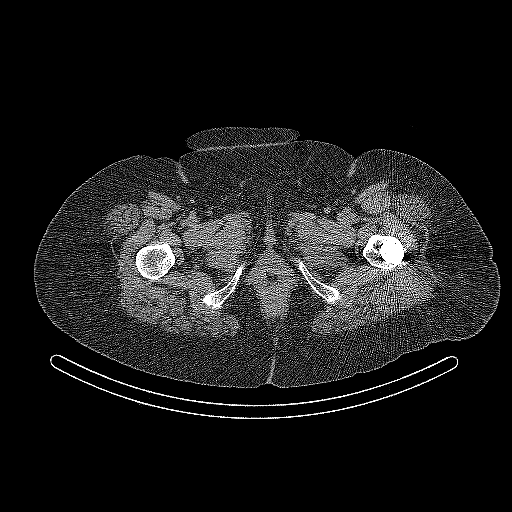
[im 208/541  bone]
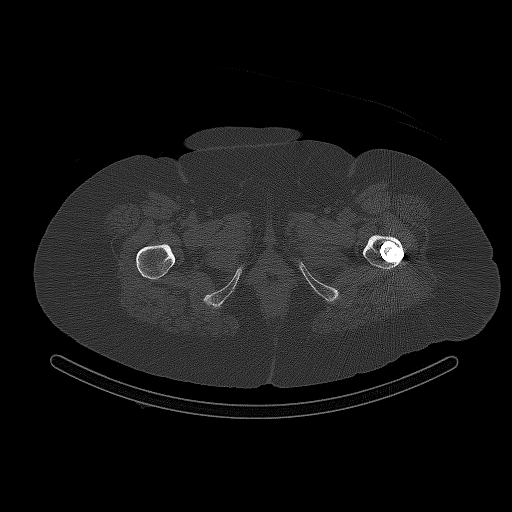
[im 250/541  bone]
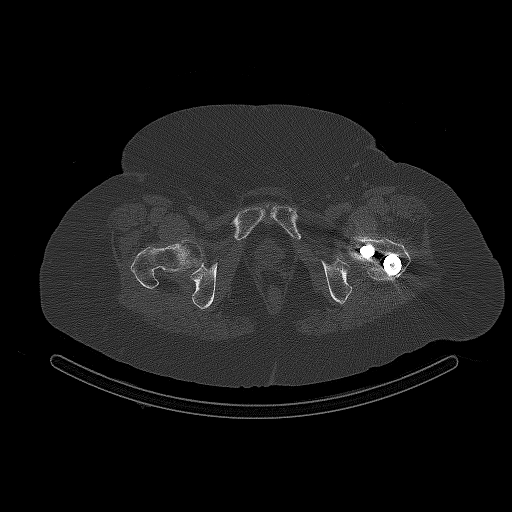
[im 291/541  bone]
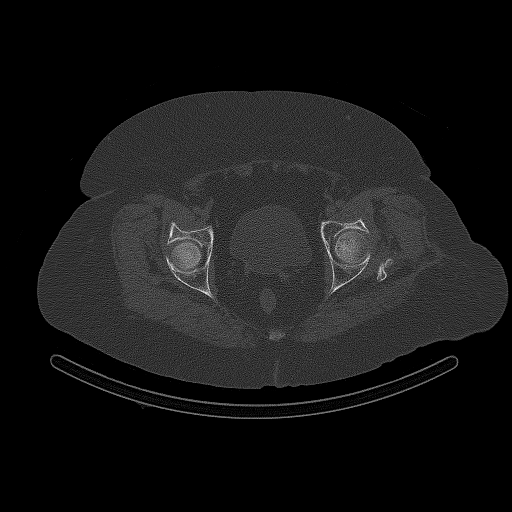
[im 333/541  bone]
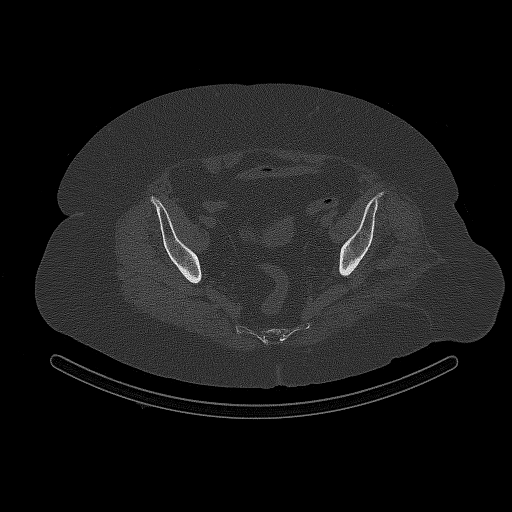
[im 374/541  soft-tissue]
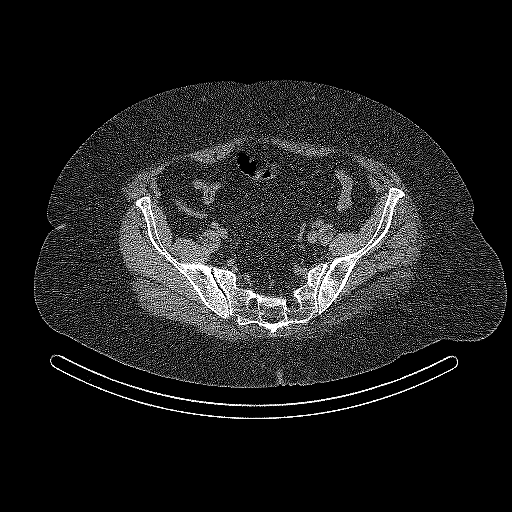
[im 374/541  bone]
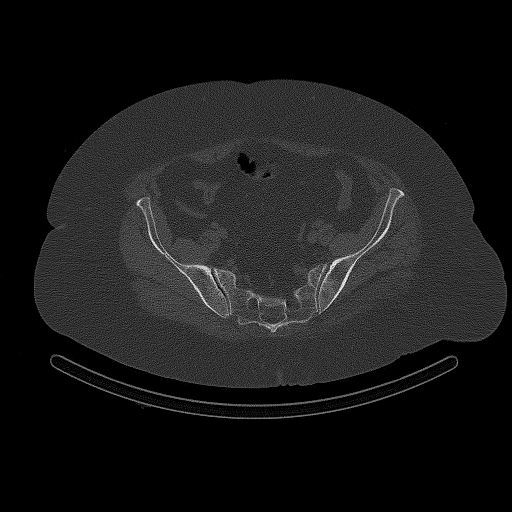
[im 416/541  bone]
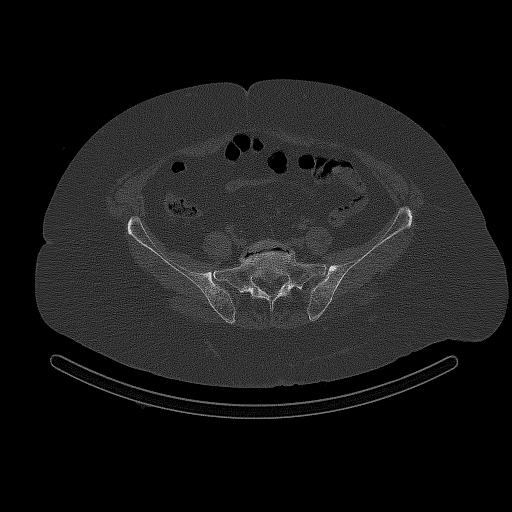
[im 457/541  bone]
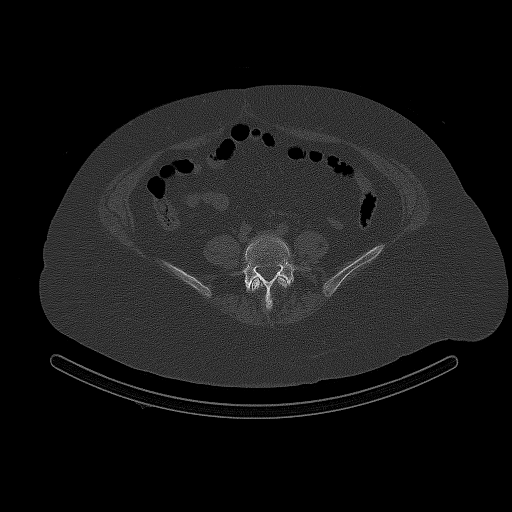
[im 499/541  bone]
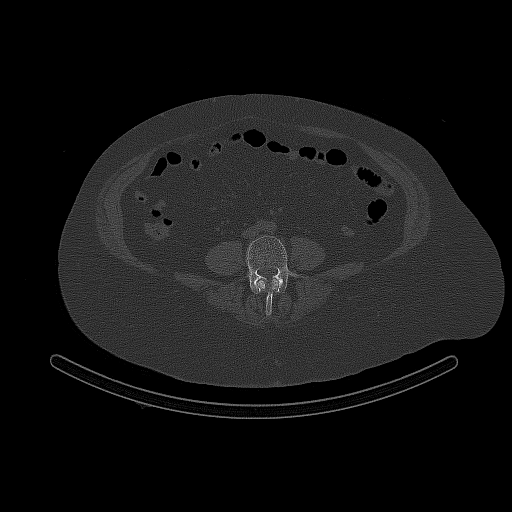

[12 of 14 positions shown; findings below may reference images not displayed]

FINDINGS: Vascular phase: Symmetric blood flow to the hips

Blood pool phase: Minimally asymmetric increased blood pool at the
LEFT greater trochanteric region

Delayed phase: Abnormal delayed tracer localization in the LEFT
femur adjacent to the distal locking screw of the IM nail and more
proximally in the LEFT femur at the greater and lesser trochanter.
This could reflect hardware loosening at the proximal LEFT femur.
Minimal focal increased tracer localization at the LEFT hip joint
likely related to degenerative change. No other abnormal delayed
tracer accumulation identified
IMPRESSION: Minimally increased tracer localization at the site of the distal
locking screw of the IM nail at the proximal LEFT femur as well as
minimally at the lesser and greater trochanter of the LEFT femur;
this pattern could be seen with hardware loosening.
